# Patient Record
Sex: Female | Born: 1935 | Race: Asian | Hispanic: No | Smoking: Never smoker
Health system: Southern US, Community
[De-identification: ages and names within clinical notes are randomized; demographics above are authoritative.]

## PROBLEM LIST (undated history)

## (undated) DIAGNOSIS — J849 Interstitial pulmonary disease, unspecified: Secondary | ICD-10-CM

## (undated) HISTORY — PX: INNER EAR SURGERY: SHX679

## (undated) HISTORY — PX: JOINT REPLACEMENT: SHX530

## (undated) HISTORY — PX: NASAL SINUS SURGERY: SHX719

---

## 2001-01-15 ENCOUNTER — Encounter: Admission: RE | Admit: 2001-01-15 | Discharge: 2001-04-15 | Payer: Self-pay | Admitting: Internal Medicine

## 2001-01-29 ENCOUNTER — Other Ambulatory Visit: Admission: RE | Admit: 2001-01-29 | Discharge: 2001-01-29 | Payer: Self-pay | Admitting: Internal Medicine

## 2001-02-04 ENCOUNTER — Encounter: Payer: Self-pay | Admitting: Internal Medicine

## 2001-02-04 ENCOUNTER — Encounter: Admission: RE | Admit: 2001-02-04 | Discharge: 2001-02-04 | Payer: Self-pay | Admitting: Internal Medicine

## 2003-01-02 ENCOUNTER — Encounter: Payer: Self-pay | Admitting: Internal Medicine

## 2003-01-02 ENCOUNTER — Encounter: Admission: RE | Admit: 2003-01-02 | Discharge: 2003-01-02 | Payer: Self-pay | Admitting: Internal Medicine

## 2013-06-15 ENCOUNTER — Other Ambulatory Visit: Payer: Self-pay | Admitting: Internal Medicine

## 2013-06-15 ENCOUNTER — Other Ambulatory Visit: Payer: Self-pay | Admitting: Family Medicine

## 2013-06-15 DIAGNOSIS — Z1231 Encounter for screening mammogram for malignant neoplasm of breast: Secondary | ICD-10-CM

## 2013-06-15 DIAGNOSIS — M810 Age-related osteoporosis without current pathological fracture: Secondary | ICD-10-CM

## 2013-07-12 ENCOUNTER — Ambulatory Visit
Admission: RE | Admit: 2013-07-12 | Discharge: 2013-07-12 | Disposition: A | Discharge status: Home / Self Care | Payer: Medicare Other | Source: Ambulatory Visit | Attending: Family Medicine | Admitting: Family Medicine

## 2013-07-12 DIAGNOSIS — M810 Age-related osteoporosis without current pathological fracture: Secondary | ICD-10-CM

## 2013-07-12 DIAGNOSIS — Z1231 Encounter for screening mammogram for malignant neoplasm of breast: Secondary | ICD-10-CM

## 2014-05-17 ENCOUNTER — Ambulatory Visit (HOSPITAL_COMMUNITY): Payer: Medicare Other

## 2014-05-17 ENCOUNTER — Other Ambulatory Visit (HOSPITAL_COMMUNITY): Payer: Self-pay | Admitting: *Deleted

## 2014-05-17 DIAGNOSIS — M1711 Unilateral primary osteoarthritis, right knee: Secondary | ICD-10-CM

## 2014-05-18 ENCOUNTER — Ambulatory Visit (HOSPITAL_COMMUNITY)
Admission: RE | Admit: 2014-05-18 | Discharge: 2014-05-18 | Disposition: A | Discharge status: Home / Self Care | Payer: Medicare Other | Source: Ambulatory Visit | Attending: Cardiovascular Disease | Admitting: Cardiovascular Disease

## 2014-05-18 DIAGNOSIS — M79604 Pain in right leg: Secondary | ICD-10-CM

## 2014-05-18 DIAGNOSIS — M1710 Unilateral primary osteoarthritis, unspecified knee: Secondary | ICD-10-CM | POA: Insufficient documentation

## 2014-05-18 DIAGNOSIS — M79609 Pain in unspecified limb: Secondary | ICD-10-CM

## 2014-05-18 DIAGNOSIS — M1711 Unilateral primary osteoarthritis, right knee: Secondary | ICD-10-CM

## 2014-05-18 NOTE — Progress Notes (Signed)
Right Lower Extremity Venous Duplex Completed. No evidence for DVT or SVT. °[name],RVT °

## 2014-05-25 ENCOUNTER — Encounter: Payer: Self-pay | Admitting: *Deleted

## 2014-06-28 ENCOUNTER — Other Ambulatory Visit: Payer: Self-pay | Admitting: Family Medicine

## 2014-06-28 DIAGNOSIS — Z1231 Encounter for screening mammogram for malignant neoplasm of breast: Secondary | ICD-10-CM

## 2014-07-17 ENCOUNTER — Ambulatory Visit
Admission: RE | Admit: 2014-07-17 | Discharge: 2014-07-17 | Disposition: A | Discharge status: Home / Self Care | Payer: Medicare Other | Source: Ambulatory Visit | Attending: Family Medicine | Admitting: Family Medicine

## 2014-07-17 DIAGNOSIS — Z1231 Encounter for screening mammogram for malignant neoplasm of breast: Secondary | ICD-10-CM

## 2021-03-26 ENCOUNTER — Telehealth: Payer: Self-pay | Admitting: Internal Medicine

## 2021-03-26 NOTE — Telephone Encounter (Signed)
Left message for patients son to call back.

## 2021-03-27 NOTE — Telephone Encounter (Signed)
LMTCB

## 2021-03-27 NOTE — Telephone Encounter (Signed)
Pt's son is returning a phone call. Pt's son can be reached at [phone].

## 2021-03-28 NOTE — Telephone Encounter (Signed)
Called and spoke to pt's son, Catherine Terrell. He states he brought his mother, the pt, to the [place] from [place] because of pt's diagnosis of IPF. He states the pt has been diagnosed for a few years but has been deteriorating. Pt is on Esbriet (max dose) and uses supplemental O2. The soonest available for Dr. Isaiah Serge is 7/27 (pt already scheduled) and Dr. Marchelle Gearing for 8/9. Catherine Terrell is very eager to get pt in to be seen and requesting message be sent to see if pt can be worked in any sooner.    Dr. Mannam/Dr. Marchelle Gearing, please advise. Thank you.

## 2021-03-28 NOTE — Telephone Encounter (Signed)
ELise  Week of April 09, 2021 -8:30 AM on my research slots.  If there is no research patient on that particular time he can discuss with Lauren and with the patient in.  Also April 10, 2021 Wednesday I have afternoon clinic but I could see the patient at noon as a special add-on or 11:30 AM

## 2021-03-29 NOTE — Telephone Encounter (Signed)
thanks

## 2021-03-29 NOTE — Telephone Encounter (Signed)
Spoke with patient's son. He wishes to take the 830am slot on 03/10/21. I will cancel the appt with Dr. Isaiah Serge. I will place an ILD packet in the mail, address verified.   Will route back to MR as a FYI.

## 2021-04-02 ENCOUNTER — Ambulatory Visit: Payer: Self-pay | Admitting: Internal Medicine

## 2021-04-09 ENCOUNTER — Institutional Professional Consult (permissible substitution): Payer: Medicare Other | Admitting: Internal Medicine

## 2021-05-01 ENCOUNTER — Institutional Professional Consult (permissible substitution): Payer: Medicare Other | Admitting: Pulmonary Disease

## 2021-05-31 ENCOUNTER — Encounter (HOSPITAL_COMMUNITY): Payer: Self-pay | Admitting: Emergency Medicine

## 2021-05-31 ENCOUNTER — Emergency Department (HOSPITAL_COMMUNITY): Payer: Medicaid - Out of State

## 2021-05-31 ENCOUNTER — Inpatient Hospital Stay (HOSPITAL_COMMUNITY)
Admission: EM | Admit: 2021-05-31 | Discharge: 2021-07-09 | DRG: 480 | Disposition: A | Discharge status: Home / Self Care | Payer: Medicaid - Out of State | Attending: Internal Medicine | Admitting: Internal Medicine

## 2021-05-31 ENCOUNTER — Other Ambulatory Visit: Payer: Self-pay

## 2021-05-31 DIAGNOSIS — W1830XA Fall on same level, unspecified, initial encounter: Secondary | ICD-10-CM | POA: Diagnosis present

## 2021-05-31 DIAGNOSIS — J449 Chronic obstructive pulmonary disease, unspecified: Secondary | ICD-10-CM | POA: Diagnosis present

## 2021-05-31 DIAGNOSIS — Z96653 Presence of artificial knee joint, bilateral: Secondary | ICD-10-CM | POA: Diagnosis present

## 2021-05-31 DIAGNOSIS — E871 Hypo-osmolality and hyponatremia: Secondary | ICD-10-CM | POA: Diagnosis not present

## 2021-05-31 DIAGNOSIS — G9341 Metabolic encephalopathy: Secondary | ICD-10-CM | POA: Diagnosis not present

## 2021-05-31 DIAGNOSIS — Z23 Encounter for immunization: Secondary | ICD-10-CM

## 2021-05-31 DIAGNOSIS — R7989 Other specified abnormal findings of blood chemistry: Secondary | ICD-10-CM

## 2021-05-31 DIAGNOSIS — Z597 Insufficient social insurance and welfare support: Secondary | ICD-10-CM | POA: Diagnosis not present

## 2021-05-31 DIAGNOSIS — G3184 Mild cognitive impairment, so stated: Secondary | ICD-10-CM

## 2021-05-31 DIAGNOSIS — Y738 Miscellaneous gastroenterology and urology devices associated with adverse incidents, not elsewhere classified: Secondary | ICD-10-CM | POA: Diagnosis not present

## 2021-05-31 DIAGNOSIS — R339 Retention of urine, unspecified: Secondary | ICD-10-CM | POA: Diagnosis not present

## 2021-05-31 DIAGNOSIS — Z419 Encounter for procedure for purposes other than remedying health state, unspecified: Secondary | ICD-10-CM

## 2021-05-31 DIAGNOSIS — R030 Elevated blood-pressure reading, without diagnosis of hypertension: Secondary | ICD-10-CM | POA: Diagnosis not present

## 2021-05-31 DIAGNOSIS — Z79899 Other long term (current) drug therapy: Secondary | ICD-10-CM

## 2021-05-31 DIAGNOSIS — I959 Hypotension, unspecified: Secondary | ICD-10-CM | POA: Diagnosis not present

## 2021-05-31 DIAGNOSIS — F0390 Unspecified dementia without behavioral disturbance: Secondary | ICD-10-CM | POA: Diagnosis present

## 2021-05-31 DIAGNOSIS — J849 Interstitial pulmonary disease, unspecified: Secondary | ICD-10-CM | POA: Diagnosis not present

## 2021-05-31 DIAGNOSIS — Y92012 Bathroom of single-family (private) house as the place of occurrence of the external cause: Secondary | ICD-10-CM

## 2021-05-31 DIAGNOSIS — H9190 Unspecified hearing loss, unspecified ear: Secondary | ICD-10-CM | POA: Diagnosis present

## 2021-05-31 DIAGNOSIS — S72002A Fracture of unspecified part of neck of left femur, initial encounter for closed fracture: Secondary | ICD-10-CM | POA: Diagnosis present

## 2021-05-31 DIAGNOSIS — E785 Hyperlipidemia, unspecified: Secondary | ICD-10-CM | POA: Diagnosis present

## 2021-05-31 DIAGNOSIS — G4700 Insomnia, unspecified: Secondary | ICD-10-CM | POA: Diagnosis present

## 2021-05-31 DIAGNOSIS — Z7901 Long term (current) use of anticoagulants: Secondary | ICD-10-CM | POA: Diagnosis not present

## 2021-05-31 DIAGNOSIS — R0989 Other specified symptoms and signs involving the circulatory and respiratory systems: Secondary | ICD-10-CM

## 2021-05-31 DIAGNOSIS — E860 Dehydration: Secondary | ICD-10-CM | POA: Diagnosis not present

## 2021-05-31 DIAGNOSIS — Z20822 Contact with and (suspected) exposure to covid-19: Secondary | ICD-10-CM | POA: Diagnosis present

## 2021-05-31 DIAGNOSIS — N320 Bladder-neck obstruction: Secondary | ICD-10-CM | POA: Diagnosis not present

## 2021-05-31 DIAGNOSIS — D62 Acute posthemorrhagic anemia: Secondary | ICD-10-CM | POA: Diagnosis not present

## 2021-05-31 DIAGNOSIS — J84112 Idiopathic pulmonary fibrosis: Secondary | ICD-10-CM | POA: Diagnosis present

## 2021-05-31 DIAGNOSIS — E039 Hypothyroidism, unspecified: Secondary | ICD-10-CM | POA: Diagnosis present

## 2021-05-31 DIAGNOSIS — J9611 Chronic respiratory failure with hypoxia: Secondary | ICD-10-CM | POA: Diagnosis present

## 2021-05-31 DIAGNOSIS — Z8781 Personal history of (healed) traumatic fracture: Secondary | ICD-10-CM

## 2021-05-31 DIAGNOSIS — S72142A Displaced intertrochanteric fracture of left femur, initial encounter for closed fracture: Secondary | ICD-10-CM | POA: Diagnosis present

## 2021-05-31 DIAGNOSIS — T83021A Displacement of indwelling urethral catheter, initial encounter: Secondary | ICD-10-CM | POA: Diagnosis not present

## 2021-05-31 DIAGNOSIS — Z751 Person awaiting admission to adequate facility elsewhere: Secondary | ICD-10-CM

## 2021-05-31 DIAGNOSIS — Z79890 Hormone replacement therapy (postmenopausal): Secondary | ICD-10-CM

## 2021-05-31 HISTORY — DX: Interstitial pulmonary disease, unspecified: J84.9

## 2021-05-31 LAB — CBC WITH DIFFERENTIAL/PLATELET
Abs Immature Granulocytes: 0.08 10*3/uL — ABNORMAL HIGH (ref 0.00–0.07)
Basophils Absolute: 0 10*3/uL (ref 0.0–0.1)
Basophils Relative: 0 %
Eosinophils Absolute: 0.1 10*3/uL (ref 0.0–0.5)
Eosinophils Relative: 0 %
HCT: 30.9 % — ABNORMAL LOW (ref 36.0–46.0)
Hemoglobin: 10.4 g/dL — ABNORMAL LOW (ref 12.0–15.0)
Immature Granulocytes: 1 %
Lymphocytes Relative: 7 %
Lymphs Abs: 1 10*3/uL (ref 0.7–4.0)
MCH: 33.8 pg (ref 26.0–34.0)
MCHC: 33.7 g/dL (ref 30.0–36.0)
MCV: 100.3 fL — ABNORMAL HIGH (ref 80.0–100.0)
Monocytes Absolute: 0.9 10*3/uL (ref 0.1–1.0)
Monocytes Relative: 6 %
Neutro Abs: 11.4 10*3/uL — ABNORMAL HIGH (ref 1.7–7.7)
Neutrophils Relative %: 86 %
Platelets: 378 10*3/uL (ref 150–400)
RBC: 3.08 MIL/uL — ABNORMAL LOW (ref 3.87–5.11)
RDW: 14.4 % (ref 11.5–15.5)
WBC: 13.4 10*3/uL — ABNORMAL HIGH (ref 4.0–10.5)
nRBC: 0 % (ref 0.0–0.2)

## 2021-05-31 LAB — BASIC METABOLIC PANEL
Anion gap: 6 (ref 5–15)
BUN: 15 mg/dL (ref 8–23)
CO2: 25 mmol/L (ref 22–32)
Calcium: 8.5 mg/dL — ABNORMAL LOW (ref 8.9–10.3)
Chloride: 97 mmol/L — ABNORMAL LOW (ref 98–111)
Creatinine, Ser: 0.9 mg/dL (ref 0.44–1.00)
GFR, Estimated: >60 mL/min (ref >60)
Glucose, Bld: 117 mg/dL — ABNORMAL HIGH (ref 70–99)
Potassium: 5.9 mmol/L — ABNORMAL HIGH (ref 3.5–5.1)
Sodium: 128 mmol/L — ABNORMAL LOW (ref 135–145)

## 2021-05-31 LAB — RESP PANEL BY RT-PCR (FLU A&B, COVID) ARPGX2
Influenza A by PCR: NEGATIVE
Influenza B by PCR: NEGATIVE
SARS Coronavirus 2 by RT PCR: NEGATIVE

## 2021-05-31 MED ORDER — MEMANTINE HCL 10 MG PO TABS
5.0000 mg | ORAL_TABLET | Freq: Every day | ORAL | Status: DC
  Administered 2021-06-01 – 2021-07-09 (×36): 5 mg via ORAL
  Filled 2021-05-31 (×38): qty 1

## 2021-05-31 MED ORDER — LACTATED RINGERS IV SOLN: INTRAVENOUS | Status: DC

## 2021-05-31 MED ORDER — ONDANSETRON HCL 4 MG/2ML IJ SOLN: 4.0000 mg | Freq: Four times a day (QID) | INTRAMUSCULAR | Status: DC | PRN

## 2021-05-31 MED ORDER — LEVOTHYROXINE SODIUM 50 MCG PO TABS
50.0000 ug | ORAL_TABLET | ORAL | Status: DC
  Administered 2021-06-03 – 2021-07-09 (×21): 50 ug via ORAL
  Filled 2021-05-31 (×20): qty 1

## 2021-05-31 MED ORDER — PIRFENIDONE 267 MG PO TABS
801.0000 mg | ORAL_TABLET | Freq: Three times a day (TID) | ORAL | Status: DC
  Administered 2021-06-01 – 2021-07-09 (×88): 801 mg via ORAL
  Filled 2021-05-31 (×3): qty 3

## 2021-05-31 MED ORDER — SENNOSIDES-DOCUSATE SODIUM 8.6-50 MG PO TABS
1.0000 | ORAL_TABLET | Freq: Every evening | ORAL | Status: DC | PRN
  Administered 2021-06-07: 1 via ORAL
  Filled 2021-05-31: qty 1

## 2021-05-31 MED ORDER — SUVOREXANT 5 MG PO TABS: 5.0000 mg | ORAL_TABLET | Freq: Every evening | ORAL | Status: DC | PRN

## 2021-05-31 MED ORDER — ACETAMINOPHEN 650 MG RE SUPP: 650.0000 mg | Freq: Four times a day (QID) | RECTAL | Status: DC | PRN

## 2021-05-31 MED ORDER — ONDANSETRON HCL 4 MG PO TABS: 4.0000 mg | ORAL_TABLET | Freq: Four times a day (QID) | ORAL | Status: DC | PRN

## 2021-05-31 MED ORDER — DONEPEZIL HCL 10 MG PO TABS
5.0000 mg | ORAL_TABLET | Freq: Every day | ORAL | Status: DC
  Administered 2021-06-01 – 2021-07-08 (×38): 5 mg via ORAL
  Filled 2021-05-31 (×39): qty 1

## 2021-05-31 MED ORDER — LEVOTHYROXINE SODIUM 100 MCG PO TABS
100.0000 ug | ORAL_TABLET | ORAL | Status: DC
  Administered 2021-06-01 – 2021-07-07 (×16): 100 ug via ORAL
  Filled 2021-05-31 (×5): qty 1
  Filled 2021-05-31: qty 2
  Filled 2021-05-31 (×11): qty 1

## 2021-05-31 MED ORDER — ATORVASTATIN CALCIUM 10 MG PO TABS
10.0000 mg | ORAL_TABLET | Freq: Every day | ORAL | Status: DC
  Administered 2021-06-01 – 2021-07-09 (×36): 10 mg via ORAL
  Filled 2021-05-31 (×40): qty 1

## 2021-05-31 MED ORDER — ACETAMINOPHEN 325 MG PO TABS
650.0000 mg | ORAL_TABLET | Freq: Four times a day (QID) | ORAL | Status: DC | PRN
  Administered 2021-06-01: 650 mg via ORAL
  Filled 2021-05-31: qty 2

## 2021-05-31 MED ORDER — HYDROMORPHONE HCL 1 MG/ML IJ SOLN
0.5000 mg | INTRAMUSCULAR | Status: DC | PRN
  Administered 2021-06-01: 0.5 mg via INTRAVENOUS
  Filled 2021-05-31 (×2): qty 0.5

## 2021-05-31 MED ORDER — HYDRALAZINE HCL 20 MG/ML IJ SOLN: 5.0000 mg | Freq: Three times a day (TID) | INTRAMUSCULAR | Status: DC | PRN

## 2021-05-31 NOTE — ED Notes (Signed)
Called transport to take patient upstairs 

## 2021-05-31 NOTE — H&P (Signed)
History and Physical    Catherine Terrell:[id number] DOB: 1933-02-09 DOA: 05/31/2021  PCP: System, Provider Not In   Patient coming from: Home  Chief Complaint: Left hip pain after fall  HPI: Catherine Terrell with medical history significant for ILD, hypothyroidism, HLD, MCI who presents by EMS after fall at home.  Reportedly patient went to the bathroom and when she was trying to stand up she fell after losing her balance and landed on her left side.  She did not have any loss of consciousness according to the family.  Son is at the bedside and provides history as patient has cognitive impairment/dementia.  They think that she did hit her head was alert and complaining of left hip pain with a got to her shortly after the fall.  She is not on anticoagulation.  She is normally very independent and high functioning according to the son who is at bedside.  Patient is unable to provide any history on her own.  ED Course: Catherine Terrell has been mechanically stable in the emergency room with mildly elevated blood pressure with hip pain.  CT of the head and neck are negative for acute pathology.  Pelvis x-ray reveals comminuted impacted intertrochanteric left hip fracture with varus angulation.  Labs have been ordered in the emergency room and are pending.  COVID swab is pending.  ER physician discussed with orthopedic surgery who stated they will see patient in consultation in the morning and Dr. Charlann Boxer who is on-call Sunday will repair the hip fracture on Sunday.  Hospitalist service been asked to admit for further management  Review of Systems:  Accurate review of system cannot be obtained secondary to cognitive impairment  Past Medical History:  Diagnosis Date   Interstitial lung disease (HCC)    gets chronic shortness of breath from this    Past Surgical History:  Procedure Laterality Date   INNER EAR SURGERY     tube was inserted in 1 ear but unsure which ear - was done in [place]    JOINT REPLACEMENT Bilateral    bilateral knee replacements - was done in [place]   NASAL SINUS SURGERY     aprrox 1982 - was done in [place]    Social History  reports that she has never smoked. She has never used smokeless tobacco. She reports that she does not drink alcohol and does not use drugs.  Allergies  Allergen Reactions   Beef-Derived Products Other (See Comments)    VEGETARIAN- EATS NO MEAT!!   Chicken Protein Other (See Comments)    VEGETARIAN- EATS NO MEAT!!   Eggs Or Egg-Derived Products Other (See Comments)    VEGETARIAN- EATS NO EGGS!!!   Pork-Derived Products Other (See Comments)    VEGETARIAN- EATS NO MEAT!!    History reviewed. No pertinent family history.   Prior to Admission medications   Medication Sig Start Date End Date Taking? Authorizing Provider  atorvastatin (LIPITOR) 10 MG tablet Take 10 mg by mouth in the morning.   Yes [provider]  BELSOMRA 5 MG TABS Take 5 mg by mouth at bedtime as needed (for insomnia).   Yes [provider]  donepezil (ARICEPT) 5 MG tablet Take 5 mg by mouth at bedtime.   Yes [provider]  ibuprofen (ADVIL) 200 MG tablet Take 400 mg by mouth at bedtime as needed (for back pain- when not taking Tramadol 50 mg tablets).   Yes [provider]  levothyroxine (  SYNTHROID) 50 MCG tablet Take 50-100 mcg by mouth See admin instructions. Take 100   Yes [provider]  memantine (NAMENDA) 5 MG tablet Take 5 mg by mouth in the morning.   Yes [provider]  Pirfenidone 267 MG TABS Take 801 mg by mouth in the morning, at noon, and at bedtime.   Yes [provider]  Probiotic Product (ADVANCED PROBIOTIC PO) Take 1 tablet by mouth See admin instructions. Advanced Digestive Probiotic: Take 1 tablet by mouth in the morning   Yes [provider]  traMADol (ULTRAM) 50 MG tablet Take 50 mg by mouth at bedtime as needed.   Yes [provider]    Physical  Exam: Vitals:   05/31/21 1753 05/31/21 1924 05/31/21 2037  BP: (!) 180/71 (!) 124/100 (!) 163/85  Pulse: 64 81 96  Resp: 18 18 18   Temp: 98.5 F (36.9 C)    TempSrc: Oral    SpO2: 98% 99% 99%    Constitutional: NAD, calm Vitals:   05/31/21 1753 05/31/21 1924 05/31/21 2037  BP: (!) 180/71 (!) 124/100 (!) 163/85  Pulse: 64 81 96  Resp: 18 18 18   Temp: 98.5 F (36.9 C)    TempSrc: Oral    SpO2: 98% 99% 99%   General: Thin elderly Terrell. Alert and oriented to self. Son provides history.  Eyes: PERRL, conjunctivae normal.  Sclera nonicteric HENT:  [place]/AT, external ears normal.  Nares patent without epistasis.  Mucous membranes are moist.  Neck: Soft, normal range of motion, supple, no masses, Trachea midline Respiratory: clear to auscultation bilaterally, no wheezing, no crackles. Normal respiratory effort. No accessory muscle use.  Cardiovascular: Regular rate and rhythm, no murmurs / rubs / gallops. No extremity edema. 2+ pedal pulses. Abdomen: Soft, no tenderness, nondistended, no rebound or guarding.  No masses palpated. Bowel sounds normoactive Musculoskeletal: FROM of upper extremities and right lower extremity. No cyanosis. Normal muscle tone.  Left leg shortened and externally rotated.  Left hip tender to palpation anteriorly or laterally Skin: Warm, dry, intact no rashes, lesions, ulcers. No induration Neurologic: CN 2-12 grossly intact.  Normal speech.  Sensation intact to touch. Grip Strength 4/5 bilaterally   Psychiatric: Normal mood.    Labs on Admission: I have personally reviewed following labs and imaging studies  CBC: Recent Labs  Lab 05/31/21 1956  WBC 13.4*  NEUTROABS 11.4*  HGB 10.4*  HCT 30.9*  MCV 100.3*  PLT 378    Basic Metabolic Panel: Recent Labs  Lab 05/31/21 1956  NA 128*  K 5.9*  CL 97*  CO2 25  GLUCOSE 117*  BUN 15  CREATININE 0.90  CALCIUM 8.5*    GFR: CrCl cannot be calculated (Unknown ideal weight.).  Liver Function  Tests: No results for input(s): AST, ALT, ALKPHOS, BILITOT, PROT, ALBUMIN in the last 168 hours.  Urine analysis: No results found for: COLORURINE, APPEARANCEUR, LABSPEC, PHURINE, GLUCOSEU, HGBUR, BILIRUBINUR, KETONESUR, PROTEINUR, UROBILINOGEN, NITRITE, LEUKOCYTESUR  Radiological Exams on Admission: CT Head Wo Contrast  Result Date: 05/31/2021 CLINICAL DATA:  Un witnessed fall, left periorbital bruising EXAM: CT HEAD WITHOUT CONTRAST TECHNIQUE: Contiguous axial images were obtained from the base of the skull through the vertex without intravenous contrast. COMPARISON:  None. FINDINGS: Brain: No acute infarct or hemorrhage. Confluent hypodensities throughout the periventricular and subcortical white matter consistent with chronic small vessel ischemic change. There is diffuse cerebral atrophy with ex vacuo dilatation of the lateral ventricles. No acute extra-axial fluid collections. No mass effect. Vascular:  Diffuse atherosclerosis.  No hyperdense vessel. Skull: Normal. Negative for fracture or focal lesion. Sinuses/Orbits: No acute finding. Impacted cerumen within the left external auditory canal. Postsurgical changes are seen within the right middle ear and mastoid. Other: None. IMPRESSION: 1. No acute intracranial trauma. 2. Chronic small-vessel ischemic changes throughout the white matter. Electronically Signed   By: Sharlet SalinaMichael  Brown M.D.   On: 05/31/2021 18:25   CT Cervical Spine Wo Contrast  Result Date: 05/31/2021 CLINICAL DATA:  Un witnessed fall, left periorbital bruising EXAM: CT CERVICAL SPINE WITHOUT CONTRAST TECHNIQUE: Multidetector CT imaging of the cervical spine was performed without intravenous contrast. Multiplanar CT image reconstructions were also generated. COMPARISON:  None. FINDINGS: Alignment: Slight reversal cervical lordosis at the C4-5 level, likely due to spondylosis and facet hypertrophy. Otherwise alignment is anatomic. Skull base and vertebrae: Evaluation of the mid cervical  spine is limited due to patient motion. There are no acute displaced fractures. No destructive bony lesions. Soft tissues and spinal canal: No prevertebral fluid or swelling. No visible canal hematoma. Disc levels: There is severe spondylosis at C4-5. Prominent facet hypertrophic changes are seen on the right from C2 through C5, with bony fusion across the right C3-C4 facet. Upper chest: There is marked bronchiectasis within the visualized right upper lobe, with associated interstitial prominence and consolidation. Findings could reflect sequela of chronic fibrosis, though superimposed infection cannot be excluded. Other: Reconstructed images demonstrate no additional findings. IMPRESSION: 1. Limited evaluation of the mid cervical spine due to motion. 2. No acute displaced fractures. 3. Prominent spondylosis and facet hypertrophy as above. 4. Right upper lobe consolidation with interstitial prominence and bronchiectasis. Findings could reflect fibrosis and scarring, though superimposed infection cannot be excluded given unilateral findings. Electronically Signed   By: Sharlet SalinaMichael  Brown M.D.   On: 05/31/2021 18:28   DG Hip Unilat W or Wo Pelvis 2-3 Views Left  Result Date: 05/31/2021 CLINICAL DATA:  Larey SeatFell, left leg deformity EXAM: DG HIP (WITH OR WITHOUT PELVIS) 2-3V LEFT COMPARISON:  None. FINDINGS: Frontal view of the pelvis as well as frontal and cross-table lateral views of the left hip are obtained. There is a comminuted impacted intertrochanteric left hip fracture with varus angulation. No dislocation. No other acute displaced fractures. Marked enthesopathic changes versus osteochondroma off the left anterior iliac crest. Sacroiliac joints are normal. Mild lower lumbar degenerative changes. IMPRESSION: 1. Comminuted impacted intertrochanteric left hip fracture, with varus angulation. Electronically Signed   By: Sharlet SalinaMichael  Brown M.D.   On: 05/31/2021 18:34    EKG: Independently reviewed.  EKG has been ordered  and is pending  Assessment/Plan Principal Problem:   Closed left hip fracture  Ms. Sherryll BurgerShah is admitted to Med-Surg floor.  Orthopedics consulted and will see pt in am. Orthopedics will repair hip fracture on Sunday per ER physician report after discussed case with ortho.  Pain control provided.  Consult PT after surgery for evaluation and assess rehab needs.  Consult case management to arrange rehab placement Foley catheter.   Active Problems:   Interstitial lung disease  Chronic. Continue home meds    Hypothyroidism Continue levothyroxine    MCI (mild cognitive impairment) Pt is on Aricept and Namenda which are continued.     DVT prophylaxis: SCDs for DVT prophylaxis. No anticoagulation with impending hip surgery  Code Status:   Full Code  Family Communication:  Diagnosis and plan discussed with son who is at bedside.  He verbalized understanding agrees with plan.  Further recommendations to follow as clinical indicated  Disposition Plan:   Patient is from:  Home  Anticipated DC to:  Rehab after surgical repair of hip fracture  Anticipated DC date:  Anticipate 2 midnight or more stay in the hospital to treat acute condition  Anticipated DC barriers: Arranging rehabilitation bed after orthopedic hip surgery could be a barrier to discharge  Consults called:  Orthopedics, Dr. Charlann Boxer to take to surgery on Sunday  Admission status:  Inpatient   Claudean Severance Hilary Pundt MD [facility]  How to contact the [facility] Attending or Consulting provider 7A - 7P or covering provider during after hours 7P -7A, for this patient?   Check the care team in [facility] and look for a) attending/consulting [facility] provider listed and b) the [facility] team listed Log into [email or web address] and use [place]'s universal password to access. If you do not have the password, please contact the hospital operator. Locate the [facility] provider you are looking for under [facility] and page to a number that you can be directly  reached. If you still have difficulty reaching the provider, please page the [facility] (Director on Call) for the Hospitalists listed on amion for assistance.  05/31/2021, 9:57 PM

## 2021-05-31 NOTE — ED Notes (Signed)
Portable suction ordered to hook up purewick for patient.

## 2021-05-31 NOTE — ED Triage Notes (Signed)
BIBA Per EMS: Pt fell in bathroom. Rotated, shortening of L hip. Pt c/o pain in hip. Pt only speaks [place]. fentanyl given en route. 22 R forearm. A&Ox4. All vitals WDL.

## 2021-05-31 NOTE — ED Provider Notes (Addendum)
[facility]-EMERGENCY DEPT Provider Note   CSN: [id number] Arrival date & time: 05/31/21  1724     History No chief complaint on file.   Catherine Terrell is a 87 y.o. female.  HPI  87 year old female who is non-English speaking who is very hard of hearing presents the emergency department for left hip deformity.  Patient reportedly fell trying to use the bathroom.  She reported to family that she lost her balance, no report of syncope.  They believe that she did hit her head, she is not anticoagulated.  She is otherwise been in her usual state of health and is high functioning for her age.  History reviewed. No pertinent past medical history.  There are no problems to display for this patient.   History reviewed. No pertinent surgical history.   OB History   No obstetric history on file.     History reviewed. No pertinent family history.     Home Medications Prior to Admission medications   Not on File    Allergies    Patient has no allergy information on record.  Review of Systems   Review of Systems  Reason unable to perform ROS: Patient very hard of hearing.   Physical Exam Updated Vital Signs BP (!) 124/100   Pulse 81   Temp 98.5 F (36.9 C) (Oral)   Resp 18   SpO2 99%   Physical Exam Vitals and nursing note reviewed.  Constitutional:      General: She is not in acute distress.    Appearance: Normal appearance.  HENT:     Head: Normocephalic.     Mouth/Throat:     Mouth: Mucous membranes are moist.  Cardiovascular:     Rate and Rhythm: Normal rate.  Pulmonary:     Effort: Pulmonary effort is normal. No respiratory distress.  Abdominal:     Palpations: Abdomen is soft.     Tenderness: There is no abdominal tenderness.  Musculoskeletal:     Comments: Left lower extremity is rotated and shortened, hip swelling, tender to palpation, equal palpable DP pulses  Skin:    General: Skin is warm.  Neurological:     Mental Status:  She is alert. Mental status is at baseline.  Psychiatric:        Mood and Affect: Mood normal.    ED Results / Procedures / Treatments   Labs (all labs ordered are listed, but only abnormal results are displayed) Labs Reviewed  RESP PANEL BY RT-PCR (FLU A&B, COVID) [id number]  CBC WITH DIFFERENTIAL/PLATELET  BASIC METABOLIC PANEL    EKG None  Radiology CT Head Wo Contrast  Result Date: 05/31/2021 CLINICAL DATA:  Un witnessed fall, left periorbital bruising EXAM: CT HEAD WITHOUT CONTRAST TECHNIQUE: Contiguous axial images were obtained from the base of the skull through the vertex without intravenous contrast. COMPARISON:  None. FINDINGS: Brain: No acute infarct or hemorrhage. Confluent hypodensities throughout the periventricular and subcortical white matter consistent with chronic small vessel ischemic change. There is diffuse cerebral atrophy with ex vacuo dilatation of the lateral ventricles. No acute extra-axial fluid collections. No mass effect. Vascular: Diffuse atherosclerosis.  No hyperdense vessel. Skull: Normal. Negative for fracture or focal lesion. Sinuses/Orbits: No acute finding. Impacted cerumen within the left external auditory canal. Postsurgical changes are seen within the right middle ear and mastoid. Other: None. IMPRESSION: 1. No acute intracranial trauma. 2. Chronic small-vessel ischemic changes throughout the white matter. Electronically Signed   By: Maxwell Caul.D.  On: 05/31/2021 18:25   CT Cervical Spine Wo Contrast  Result Date: 05/31/2021 CLINICAL DATA:  Un witnessed fall, left periorbital bruising EXAM: CT CERVICAL SPINE WITHOUT CONTRAST TECHNIQUE: Multidetector CT imaging of the cervical spine was performed without intravenous contrast. Multiplanar CT image reconstructions were also generated. COMPARISON:  None. FINDINGS: Alignment: Slight reversal cervical lordosis at the C4-5 level, likely due to spondylosis and facet hypertrophy. Otherwise alignment is  anatomic. Skull base and vertebrae: Evaluation of the mid cervical spine is limited due to patient motion. There are no acute displaced fractures. No destructive bony lesions. Soft tissues and spinal canal: No prevertebral fluid or swelling. No visible canal hematoma. Disc levels: There is severe spondylosis at C4-5. Prominent facet hypertrophic changes are seen on the right from C2 through C5, with bony fusion across the right C3-C4 facet. Upper chest: There is marked bronchiectasis within the visualized right upper lobe, with associated interstitial prominence and consolidation. Findings could reflect sequela of chronic fibrosis, though superimposed infection cannot be excluded. Other: Reconstructed images demonstrate no additional findings. IMPRESSION: 1. Limited evaluation of the mid cervical spine due to motion. 2. No acute displaced fractures. 3. Prominent spondylosis and facet hypertrophy as above. 4. Right upper lobe consolidation with interstitial prominence and bronchiectasis. Findings could reflect fibrosis and scarring, though superimposed infection cannot be excluded given unilateral findings. Electronically Signed   By: Sharlet Salina M.D.   On: 05/31/2021 18:28   DG Hip Unilat W or Wo Pelvis 2-3 Views Left  Result Date: 05/31/2021 CLINICAL DATA:  Larey Seat, left leg deformity EXAM: DG HIP (WITH OR WITHOUT PELVIS) 2-3V LEFT COMPARISON:  None. FINDINGS: Frontal view of the pelvis as well as frontal and cross-table lateral views of the left hip are obtained. There is a comminuted impacted intertrochanteric left hip fracture with varus angulation. No dislocation. No other acute displaced fractures. Marked enthesopathic changes versus osteochondroma off the left anterior iliac crest. Sacroiliac joints are normal. Mild lower lumbar degenerative changes. IMPRESSION: 1. Comminuted impacted intertrochanteric left hip fracture, with varus angulation. Electronically Signed   By: Sharlet Salina M.D.   On:  05/31/2021 18:34    Procedures Procedures   Medications Ordered in ED Medications - No data to display  ED Course  I have reviewed the triage vital signs and the nursing notes.  Pertinent labs & imaging results that were available during my care of the patient were reviewed by me and considered in my medical decision making (see chart for details).    MDM Rules/Calculators/A&P                           87 year old female presents with mechanical fall.  She sustained a comminuted intertrochanteric left hip fracture with impaction and angulation.  Leg is vascularly intact.  Son has been updated and he relayed the information to the patient.  On-call orthopedic recommends admission to [facility], pain control, preoperative work up, NPO Saturday at midnight. Dr. Charlann Boxer will plan to surgically correct the hip Sunday. Patient will be medically admitted.  Patient stable at time of admission.  Son has returned with history/med list.  She is on medication for cholesterol, thyroid, dementia and interstitial lung disease.  This has been updated in the chart.  Son reports that the patient is otherwise independent and high functioning.  Final Clinical Impression(s) / ED Diagnoses Final diagnoses:  None    Rx / DC Orders ED Discharge Orders     None  Rozelle Logan, DO 05/31/21 2031    Rozelle Logan, DO 05/31/21 2056

## 2021-06-01 DIAGNOSIS — J849 Interstitial pulmonary disease, unspecified: Secondary | ICD-10-CM | POA: Diagnosis not present

## 2021-06-01 DIAGNOSIS — S72002A Fracture of unspecified part of neck of left femur, initial encounter for closed fracture: Secondary | ICD-10-CM | POA: Diagnosis not present

## 2021-06-01 LAB — BASIC METABOLIC PANEL
Anion gap: 8 (ref 5–15)
BUN: 14 mg/dL (ref 8–23)
CO2: 23 mmol/L (ref 22–32)
Calcium: 8.4 mg/dL — ABNORMAL LOW (ref 8.9–10.3)
Chloride: 99 mmol/L (ref 98–111)
Creatinine, Ser: 0.73 mg/dL (ref 0.44–1.00)
GFR, Estimated: >60 mL/min (ref >60)
Glucose, Bld: 117 mg/dL — ABNORMAL HIGH (ref 70–99)
Potassium: 4.1 mmol/L (ref 3.5–5.1)
Sodium: 130 mmol/L — ABNORMAL LOW (ref 135–145)

## 2021-06-01 LAB — CBC
HCT: 30.8 % — ABNORMAL LOW (ref 36.0–46.0)
Hemoglobin: 10.4 g/dL — ABNORMAL LOW (ref 12.0–15.0)
MCH: 33 pg (ref 26.0–34.0)
MCHC: 33.8 g/dL (ref 30.0–36.0)
MCV: 97.8 fL (ref 80.0–100.0)
Platelets: 355 10*3/uL (ref 150–400)
RBC: 3.15 MIL/uL — ABNORMAL LOW (ref 3.87–5.11)
RDW: 13.2 % (ref 11.5–15.5)
WBC: 9.9 10*3/uL (ref 4.0–10.5)
nRBC: 0 % (ref 0.0–0.2)

## 2021-06-01 LAB — OSMOLALITY: Osmolality: 272 mOsm/kg — ABNORMAL LOW (ref 275–295)

## 2021-06-01 LAB — SURGICAL PCR SCREEN
MRSA, PCR: NEGATIVE
Staphylococcus aureus: NEGATIVE

## 2021-06-01 MED ORDER — CHLORHEXIDINE GLUCONATE CLOTH 2 % EX PADS
6.0000 | MEDICATED_PAD | Freq: Every day | CUTANEOUS | Status: DC
  Administered 2021-06-01 – 2021-06-07 (×5): 6 via TOPICAL

## 2021-06-01 MED ORDER — CEFAZOLIN SODIUM-DEXTROSE 2-4 GM/100ML-% IV SOLN: 2.0000 g | INTRAVENOUS | Status: DC

## 2021-06-01 NOTE — Consult Note (Signed)
Reason for Consult:Left intertrochanteric hip fracture Referring Physician: Rhona Leavens MD  NATURE Catherine Terrell is an 87 y.o. female.  HPI: 87 yo female who is a household ambulator at home who fell and injured her left hip. Patient was unable to stand after the fall. She did hit her head by report from her son. He denied LOC. Patient has advanced dementia and is unable to provide history.   Past Medical History:  Diagnosis Date   Interstitial lung disease (HCC)    gets chronic shortness of breath from this    Past Surgical History:  Procedure Laterality Date   INNER EAR SURGERY     tube was inserted in 1 ear but unsure which ear - was done in [place]   JOINT REPLACEMENT Bilateral    bilateral knee replacements - was done in [place]   NASAL SINUS SURGERY     aprrox 1982 - was done in [place]    History reviewed. No pertinent family history.  Social History:  reports that she has never smoked. She has never used smokeless tobacco. She reports that she does not drink alcohol and does not use drugs.  Allergies:  Allergies  Allergen Reactions   Beef-Derived Products Other (See Comments)    VEGETARIAN- EATS NO MEAT!!   Chicken Protein Other (See Comments)    VEGETARIAN- EATS NO MEAT!!   Eggs Or Egg-Derived Products Other (See Comments)    VEGETARIAN- EATS NO EGGS!!!   Pork-Derived Products Other (See Comments)    VEGETARIAN- EATS NO MEAT!!    Medications: I have reviewed the patient's current medications.  Results for orders placed or performed during the hospital encounter of 05/31/21 (from the past 48 hour(s))  CBC with Differential     Status: Abnormal   Collection Time: 05/31/21  7:56 PM  Result Value Ref Range   WBC 13.4 (H) 4.0 - 10.5 K/uL   RBC 3.08 (L) 3.87 - 5.11 MIL/uL   Hemoglobin 10.4 (L) 12.0 - 15.0 g/dL   HCT 67.2 (L) 09.4 - 70.9 %   MCV 100.3 (H) 80.0 - 100.0 fL   MCH 33.8 26.0 - 34.0 pg   MCHC 33.7 30.0 - 36.0 g/dL   RDW 62.8 36.6 - 29.4 %   Platelets 378 150 - 400 K/uL    nRBC 0.0 0.0 - 0.2 %   Neutrophils Relative % 86 %   Neutro Abs 11.4 (H) 1.7 - 7.7 K/uL   Lymphocytes Relative 7 %   Lymphs Abs 1.0 0.7 - 4.0 K/uL   Monocytes Relative 6 %   Monocytes Absolute 0.9 0.1 - 1.0 K/uL   Eosinophils Relative 0 %   Eosinophils Absolute 0.1 0.0 - 0.5 K/uL   Basophils Relative 0 %   Basophils Absolute 0.0 0.0 - 0.1 K/uL   Immature Granulocytes 1 %   Abs Immature Granulocytes 0.08 (H) 0.00 - 0.07 K/uL    Comment: Performed at [facility], [address] [place], [place] [address]  Basic metabolic panel     Status: Abnormal   Collection Time: 05/31/21  7:56 PM  Result Value Ref Range   Sodium 128 (L) 135 - 145 mmol/L   Potassium 5.9 (H) 3.5 - 5.1 mmol/L   Chloride 97 (L) 98 - 111 mmol/L   CO2 25 22 - 32 mmol/L   Glucose, Bld 117 (H) 70 - 99 mg/dL    Comment: Glucose reference range applies only to samples taken after fasting for at least 8 hours.  BUN 15 8 - 23 mg/dL   Creatinine, Ser 3.29 0.44 - 1.00 mg/dL   Calcium 8.5 (L) 8.9 - 10.3 mg/dL   GFR, Estimated >51 >88 mL/min    Comment: (NOTE) Calculated using the CKD-EPI Creatinine Equation (2021)    Anion gap 6 5 - 15    Comment: Performed at [facility], [address], [place], [place] [address]  Resp Panel by RT-PCR (Flu A&B, Covid) Nasopharyngeal Swab     Status: None   Collection Time: 05/31/21  8:24 PM   Specimen: Nasopharyngeal Swab; Nasopharyngeal(NP) swabs in vial transport medium  Result Value Ref Range   SARS Coronavirus 2 by RT PCR NEGATIVE NEGATIVE    Comment: (NOTE) SARS-CoV-2 target nucleic acids are NOT DETECTED.  The SARS-CoV-2 RNA is generally detectable in upper respiratory specimens during the acute phase of infection. The lowest concentration of SARS-CoV-2 viral copies this assay can detect is 138 copies/mL. A negative result does not preclude SARS-Cov-2 infection and should not be used as the sole basis for treatment or other patient  management decisions. A negative result may occur with  improper specimen collection/handling, submission of specimen other than nasopharyngeal swab, presence of viral mutation(s) within the areas targeted by this assay, and inadequate number of viral copies(<138 copies/mL). A negative result must be combined with clinical observations, patient history, and epidemiological information. The expected result is Negative.  Fact Sheet for Patients:  BloggerCourse.com  Fact Sheet for Healthcare Providers:  SeriousBroker.it  This test is no t yet approved or cleared by the [place] FDA and  has been authorized for detection and/or diagnosis of SARS-CoV-2 by FDA under an Emergency Use Authorization (EUA). This EUA will remain  in effect (meaning this test can be used) for the duration of the COVID-19 declaration under Section 564(b)(1) of the Act, 21 U.S.C.section 360bbb-3(b)(1), unless the authorization is terminated  or revoked sooner.       Influenza A by PCR NEGATIVE NEGATIVE   Influenza B by PCR NEGATIVE NEGATIVE    Comment: (NOTE) The Xpert Xpress SARS-CoV-2/FLU/RSV plus assay is intended as an aid in the diagnosis of influenza from Nasopharyngeal swab specimens and should not be used as a sole basis for treatment. Nasal washings and aspirates are unacceptable for Xpert Xpress SARS-CoV-2/FLU/RSV testing.  Fact Sheet for Patients: BloggerCourse.com  Fact Sheet for Healthcare Providers: SeriousBroker.it  This test is not yet approved or cleared by the [place] FDA and has been authorized for detection and/or diagnosis of SARS-CoV-2 by FDA under an Emergency Use Authorization (EUA). This EUA will remain in effect (meaning this test can be used) for the duration of the COVID-19 declaration under Section 564(b)(1) of the Act, 21 U.S.C. section 360bbb-3(b)(1), unless the  authorization is terminated or revoked.  Performed at [facility], [address] [place], [place] [address]   Basic metabolic panel     Status: Abnormal   Collection Time: 06/01/21  3:35 AM  Result Value Ref Range   Sodium 130 (L) 135 - 145 mmol/L   Potassium 4.1 3.5 - 5.1 mmol/L    Comment: DELTA CHECK NOTED   Chloride 99 98 - 111 mmol/L   CO2 23 22 - 32 mmol/L   Glucose, Bld 117 (H) 70 - 99 mg/dL    Comment: Glucose reference range applies only to samples taken after fasting for at least 8 hours.   BUN 14 8 - 23 mg/dL   Creatinine, Ser 0.10 0.44 - 1.00  mg/dL   Calcium 8.4 (L) 8.9 - 10.3 mg/dL   GFR, Estimated >13 >24 mL/min    Comment: (NOTE) Calculated using the CKD-EPI Creatinine Equation (2021)    Anion gap 8 5 - 15    Comment: Performed at [facility], [address] [place], [place] [address]  CBC     Status: Abnormal   Collection Time: 06/01/21  3:35 AM  Result Value Ref Range   WBC 9.9 4.0 - 10.5 K/uL   RBC 3.15 (L) 3.87 - 5.11 MIL/uL   Hemoglobin 10.4 (L) 12.0 - 15.0 g/dL   HCT 72.5 (L) 36.6 - 44.0 %   MCV 97.8 80.0 - 100.0 fL   MCH 33.0 26.0 - 34.0 pg   MCHC 33.8 30.0 - 36.0 g/dL   RDW 34.7 42.5 - 95.6 %   Platelets 355 150 - 400 K/uL   nRBC 0.0 0.0 - 0.2 %    Comment: Performed at [facility], [address] [place], [place] [address]    CT Head Wo Contrast  Result Date: 05/31/2021 CLINICAL DATA:  Un witnessed fall, left periorbital bruising EXAM: CT HEAD WITHOUT CONTRAST TECHNIQUE: Contiguous axial images were obtained from the base of the skull through the vertex without intravenous contrast. COMPARISON:  None. FINDINGS: Brain: No acute infarct or hemorrhage. Confluent hypodensities throughout the periventricular and subcortical white matter consistent with chronic small vessel ischemic change. There is diffuse cerebral atrophy with ex vacuo dilatation of the lateral ventricles. No acute  extra-axial fluid collections. No mass effect. Vascular: Diffuse atherosclerosis.  No hyperdense vessel. Skull: Normal. Negative for fracture or focal lesion. Sinuses/Orbits: No acute finding. Impacted cerumen within the left external auditory canal. Postsurgical changes are seen within the right middle ear and mastoid. Other: None. IMPRESSION: 1. No acute intracranial trauma. 2. Chronic small-vessel ischemic changes throughout the white matter. Electronically Signed   By: Sharlet Salina M.D.   On: 05/31/2021 18:25   CT Cervical Spine Wo Contrast  Result Date: 05/31/2021 CLINICAL DATA:  Un witnessed fall, left periorbital bruising EXAM: CT CERVICAL SPINE WITHOUT CONTRAST TECHNIQUE: Multidetector CT imaging of the cervical spine was performed without intravenous contrast. Multiplanar CT image reconstructions were also generated. COMPARISON:  None. FINDINGS: Alignment: Slight reversal cervical lordosis at the C4-5 level, likely due to spondylosis and facet hypertrophy. Otherwise alignment is anatomic. Skull base and vertebrae: Evaluation of the mid cervical spine is limited due to patient motion. There are no acute displaced fractures. No destructive bony lesions. Soft tissues and spinal canal: No prevertebral fluid or swelling. No visible canal hematoma. Disc levels: There is severe spondylosis at C4-5. Prominent facet hypertrophic changes are seen on the right from C2 through C5, with bony fusion across the right C3-C4 facet. Upper chest: There is marked bronchiectasis within the visualized right upper lobe, with associated interstitial prominence and consolidation. Findings could reflect sequela of chronic fibrosis, though superimposed infection cannot be excluded. Other: Reconstructed images demonstrate no additional findings. IMPRESSION: 1. Limited evaluation of the mid cervical spine due to motion. 2. No acute displaced fractures. 3. Prominent spondylosis and facet hypertrophy as above. 4. Right upper lobe  consolidation with interstitial prominence and bronchiectasis. Findings could reflect fibrosis and scarring, though superimposed infection cannot be excluded given unilateral findings. Electronically Signed   By: Sharlet Salina M.D.   On: 05/31/2021 18:28   DG Hip Unilat W or Wo Pelvis 2-3 Views Left  Result Date: 05/31/2021 CLINICAL DATA:  Larey Seat, left leg  deformity EXAM: DG HIP (WITH OR WITHOUT PELVIS) 2-3V LEFT COMPARISON:  None. FINDINGS: Frontal view of the pelvis as well as frontal and cross-table lateral views of the left hip are obtained. There is a comminuted impacted intertrochanteric left hip fracture with varus angulation. No dislocation. No other acute displaced fractures. Marked enthesopathic changes versus osteochondroma off the left anterior iliac crest. Sacroiliac joints are normal. Mild lower lumbar degenerative changes. IMPRESSION: 1. Comminuted impacted intertrochanteric left hip fracture, with varus angulation. Electronically Signed   By: Sharlet SalinaMichael  Brown M.D.   On: 05/31/2021 18:34    Review of Systems Blood pressure 132/75, pulse 100, temperature 98 F (36.7 C), temperature source Oral, resp. rate 17, weight 46.8 kg, SpO2 95 %. Physical Exam Patient is an elderly female in moderate distress. Head and neck atraumatic with normal conjugate gaze. Neck is non tender. Normal cervical ROM Bilateral shoulder and arms with normal AROM Chest nontender Right LE with pain free AROM Left leg is shortened and externally rotated. Good perfusion distally  Assessment/Plan: Left displaced intertrochanteric hip fracture. Surgery planned for tomorrow. Plan will be IM nail per Dr Charlann Boxerlin.  Orders for consent placed. Will need family consent. Patient will need to be NPO after MN for surgery tomorrow.   Verlee RossettiSteven R Joahan Swatzell 06/01/2021, 8:22 AM

## 2021-06-01 NOTE — Progress Notes (Signed)
Orthopedic Tech Progress Note Patient Details:  Catherine Terrell 1933-02-26 [id number]  Patient ID: Catherine Terrell, female   DOB: 1933/06/26, 87 y.o.   MRN: [id number]  Catherine Terrell 06/01/2021, 10:13 AM Bucks traction applied to left leg. Patient tolerated 10 lbs.

## 2021-06-01 NOTE — Progress Notes (Signed)
PROGRESS NOTE    Catherine Terrell  OXB:[id number] DOB: 1933/03/22 DOA: 05/31/2021 PCP: System, Provider Not In    Brief Narrative:  87 y.o. female with medical history significant for ILD, hypothyroidism, HLD, MCI who presents by EMS after fall at home. Pt presented with L hip fracture. Orthopedic Surgery was consulted  Assessment & Plan:   Principal Problem:   Closed left hip fracture (HCC) Active Problems:   Interstitial lung disease (HCC)   Hypothyroidism   MCI (mild cognitive impairment)  Principal Problem:   Closed left hip fracture  -Orthopedics consulted with plan for surgery 8/28  -Cont with analgesia as tolerated -Dispo planning post-op with PT planned   Active Problems:   Interstitial lung disease  -Stable and on minmal O2 support at this time -Stable     Hypothyroidism -Continue levothyroxine as tolerated     MCI (mild cognitive impairment) -Pt is on Aricept and Namenda -Stable at this time   DVT prophylaxis: SCD's Code Status: Full Family Communication: Pt in room, family not at bedside  Status is: Inpatient  Remains inpatient appropriate because:Inpatient level of care appropriate due to severity of illness  Dispo: The patient is from: Home              Anticipated d/c is to: SNF              Patient currently is not medically stable to d/c.   Difficult to place patient No       Consultants:  Orthopedic Surgery  Procedures:    Antimicrobials: Anti-infectives (From admission, onward)    Start     Dose/Rate Route Frequency Ordered Stop   06/02/21 0600  ceFAZolin (ANCEF) IVPB 2g/100 mL premix        2 g 200 mL/hr over 30 Minutes Intravenous On call to O.R. 06/01/21 1333 06/03/21 0559       Subjective: Without complaints  Objective: Vitals:   06/01/21 0135 06/01/21 0532 06/01/21 0629 06/01/21 1350  BP: 133/70 132/75  114/88  Pulse: (!) 110 100  96  Resp: 16 (!) 24 17 17   Temp: 98 F (36.7 C) 98 F (36.7 C)    TempSrc: Oral Oral     SpO2: 98% 95%  94%  Weight:        Intake/Output Summary (Last 24 hours) at 06/01/2021 1350 Last data filed at 06/01/2021 1020 Gross per 24 hour  Intake 581.15 ml  Output 290 ml  Net 291.15 ml   Filed Weights   05/31/21 2309  Weight: 46.8 kg    Examination: General exam: Awake, laying in bed, in nad Respiratory system: Normal respiratory effort, no wheezing Cardiovascular system: regular rate, s1, s2 Gastrointestinal system: Soft, nondistended, positive BS Central nervous system: CN2-12 grossly intact, strength intact Extremities: Perfused, no clubbing Skin: Normal skin turgor, no notable skin lesions seen Psychiatry: Difficult to assess given mentation  Data Reviewed: I have personally reviewed following labs and imaging studies  CBC: Recent Labs  Lab 05/31/21 1956 06/01/21 0335  WBC 13.4* 9.9  NEUTROABS 11.4*  --   HGB 10.4* 10.4*  HCT 30.9* 30.8*  MCV 100.3* 97.8  PLT 378 355   Basic Metabolic Panel: Recent Labs  Lab 05/31/21 1956 06/01/21 0335  NA 128* 130*  K 5.9* 4.1  CL 97* 99  CO2 25 23  GLUCOSE 117* 117*  BUN 15 14  CREATININE 0.90 0.73  CALCIUM 8.5* 8.4*   GFR: CrCl cannot be calculated (Unknown ideal weight.). Liver Function Tests:  No results for input(s): AST, ALT, ALKPHOS, BILITOT, PROT, ALBUMIN in the last 168 hours. No results for input(s): LIPASE, AMYLASE in the last 168 hours. No results for input(s): AMMONIA in the last 168 hours. Coagulation Profile: No results for input(s): INR, PROTIME in the last 168 hours. Cardiac Enzymes: No results for input(s): CKTOTAL, CKMB, CKMBINDEX, TROPONINI in the last 168 hours. BNP (last 3 results) No results for input(s): PROBNP in the last 8760 hours. HbA1C: No results for input(s): HGBA1C in the last 72 hours. CBG: No results for input(s): GLUCAP in the last 168 hours. Lipid Profile: No results for input(s): CHOL, HDL, LDLCALC, TRIG, CHOLHDL, LDLDIRECT in the last 72 hours. Thyroid Function  Tests: No results for input(s): TSH, T4TOTAL, FREET4, T3FREE, THYROIDAB in the last 72 hours. Anemia Panel: No results for input(s): VITAMINB12, FOLATE, FERRITIN, TIBC, IRON, RETICCTPCT in the last 72 hours. Sepsis Labs: No results for input(s): PROCALCITON, LATICACIDVEN in the last 168 hours.  Recent Results (from the past 240 hour(s))  Resp Panel by RT-PCR (Flu A&B, Covid) Nasopharyngeal Swab     Status: None   Collection Time: 05/31/21  8:24 PM   Specimen: Nasopharyngeal Swab; Nasopharyngeal(NP) swabs in vial transport medium  Result Value Ref Range Status   SARS Coronavirus 2 by RT PCR NEGATIVE NEGATIVE Final    Comment: (NOTE) SARS-CoV-2 target nucleic acids are NOT DETECTED.  The SARS-CoV-2 RNA is generally detectable in upper respiratory specimens during the acute phase of infection. The lowest concentration of SARS-CoV-2 viral copies this assay can detect is 138 copies/mL. A negative result does not preclude SARS-Cov-2 infection and should not be used as the sole basis for treatment or other patient management decisions. A negative result may occur with  improper specimen collection/handling, submission of specimen other than nasopharyngeal swab, presence of viral mutation(s) within the areas targeted by this assay, and inadequate number of viral copies(<138 copies/mL). A negative result must be combined with clinical observations, patient history, and epidemiological information. The expected result is Negative.  Fact Sheet for Patients:  BloggerCourse.com  Fact Sheet for Healthcare Providers:  SeriousBroker.it  This test is no t yet approved or cleared by the [place] FDA and  has been authorized for detection and/or diagnosis of SARS-CoV-2 by FDA under an Emergency Use Authorization (EUA). This EUA will remain  in effect (meaning this test can be used) for the duration of the COVID-19 declaration under Section  564(b)(1) of the Act, 21 U.S.C.section 360bbb-3(b)(1), unless the authorization is terminated  or revoked sooner.       Influenza A by PCR NEGATIVE NEGATIVE Final   Influenza B by PCR NEGATIVE NEGATIVE Final    Comment: (NOTE) The Xpert Xpress SARS-CoV-2/FLU/RSV plus assay is intended as an aid in the diagnosis of influenza from Nasopharyngeal swab specimens and should not be used as a sole basis for treatment. Nasal washings and aspirates are unacceptable for Xpert Xpress SARS-CoV-2/FLU/RSV testing.  Fact Sheet for Patients: BloggerCourse.com  Fact Sheet for Healthcare Providers: SeriousBroker.it  This test is not yet approved or cleared by the [place] FDA and has been authorized for detection and/or diagnosis of SARS-CoV-2 by FDA under an Emergency Use Authorization (EUA). This EUA will remain in effect (meaning this test can be used) for the duration of the COVID-19 declaration under Section 564(b)(1) of the Act, 21 U.S.C. section 360bbb-3(b)(1), unless the authorization is terminated or revoked.  Performed at [facility] [address], [place], [place] [address]  Radiology Studies: CT Head Wo Contrast  Result Date: 05/31/2021 CLINICAL DATA:  Un witnessed fall, left periorbital bruising EXAM: CT HEAD WITHOUT CONTRAST TECHNIQUE: Contiguous axial images were obtained from the base of the skull through the vertex without intravenous contrast. COMPARISON:  None. FINDINGS: Brain: No acute infarct or hemorrhage. Confluent hypodensities throughout the periventricular and subcortical white matter consistent with chronic small vessel ischemic change. There is diffuse cerebral atrophy with ex vacuo dilatation of the lateral ventricles. No acute extra-axial fluid collections. No mass effect. Vascular: Diffuse atherosclerosis.  No hyperdense vessel. Skull: Normal. Negative for fracture or focal lesion.  Sinuses/Orbits: No acute finding. Impacted cerumen within the left external auditory canal. Postsurgical changes are seen within the right middle ear and mastoid. Other: None. IMPRESSION: 1. No acute intracranial trauma. 2. Chronic small-vessel ischemic changes throughout the white matter. Electronically Signed   By: Sharlet SalinaMichael  Brown M.D.   On: 05/31/2021 18:25   CT Cervical Spine Wo Contrast  Result Date: 05/31/2021 CLINICAL DATA:  Un witnessed fall, left periorbital bruising EXAM: CT CERVICAL SPINE WITHOUT CONTRAST TECHNIQUE: Multidetector CT imaging of the cervical spine was performed without intravenous contrast. Multiplanar CT image reconstructions were also generated. COMPARISON:  None. FINDINGS: Alignment: Slight reversal cervical lordosis at the C4-5 level, likely due to spondylosis and facet hypertrophy. Otherwise alignment is anatomic. Skull base and vertebrae: Evaluation of the mid cervical spine is limited due to patient motion. There are no acute displaced fractures. No destructive bony lesions. Soft tissues and spinal canal: No prevertebral fluid or swelling. No visible canal hematoma. Disc levels: There is severe spondylosis at C4-5. Prominent facet hypertrophic changes are seen on the right from C2 through C5, with bony fusion across the right C3-C4 facet. Upper chest: There is marked bronchiectasis within the visualized right upper lobe, with associated interstitial prominence and consolidation. Findings could reflect sequela of chronic fibrosis, though superimposed infection cannot be excluded. Other: Reconstructed images demonstrate no additional findings. IMPRESSION: 1. Limited evaluation of the mid cervical spine due to motion. 2. No acute displaced fractures. 3. Prominent spondylosis and facet hypertrophy as above. 4. Right upper lobe consolidation with interstitial prominence and bronchiectasis. Findings could reflect fibrosis and scarring, though superimposed infection cannot be excluded  given unilateral findings. Electronically Signed   By: Sharlet SalinaMichael  Brown M.D.   On: 05/31/2021 18:28   DG Hip Unilat W or Wo Pelvis 2-3 Views Left  Result Date: 05/31/2021 CLINICAL DATA:  Larey SeatFell, left leg deformity EXAM: DG HIP (WITH OR WITHOUT PELVIS) 2-3V LEFT COMPARISON:  None. FINDINGS: Frontal view of the pelvis as well as frontal and cross-table lateral views of the left hip are obtained. There is a comminuted impacted intertrochanteric left hip fracture with varus angulation. No dislocation. No other acute displaced fractures. Marked enthesopathic changes versus osteochondroma off the left anterior iliac crest. Sacroiliac joints are normal. Mild lower lumbar degenerative changes. IMPRESSION: 1. Comminuted impacted intertrochanteric left hip fracture, with varus angulation. Electronically Signed   By: Sharlet SalinaMichael  Brown M.D.   On: 05/31/2021 18:34    Scheduled Meds:  atorvastatin  10 mg Oral Daily   Chlorhexidine Gluconate Cloth  6 each Topical Daily   donepezil  5 mg Oral QHS   levothyroxine  100 mcg Oral Once per day on Sun Fri Sat   [START ON 06/03/2021] levothyroxine  50 mcg Oral Once per day on Mon Tue Wed Thu   memantine  5 mg Oral Daily   Pirfenidone  801 mg Oral TID  Continuous Infusions:  [START ON 06/02/2021]  ceFAZolin (ANCEF) IV     lactated ringers 60 mL/hr at 05/31/21 2248     LOS: 1 day   Rickey Barbara, MD [facility] Pager On Amion  If 7PM-7AM, please contact night-coverage 06/01/2021, 1:50 PM

## 2021-06-02 ENCOUNTER — Inpatient Hospital Stay (HOSPITAL_COMMUNITY): Payer: Medicaid - Out of State

## 2021-06-02 ENCOUNTER — Encounter (HOSPITAL_COMMUNITY)
Admission: EM | Disposition: A | Discharge status: Home / Self Care | Payer: Self-pay | Source: Home / Self Care | Attending: Internal Medicine

## 2021-06-02 ENCOUNTER — Inpatient Hospital Stay (HOSPITAL_COMMUNITY): Payer: Medicaid - Out of State | Admitting: Certified Registered"

## 2021-06-02 ENCOUNTER — Encounter (HOSPITAL_COMMUNITY): Payer: Self-pay | Admitting: Family Medicine

## 2021-06-02 DIAGNOSIS — S72002A Fracture of unspecified part of neck of left femur, initial encounter for closed fracture: Secondary | ICD-10-CM | POA: Diagnosis not present

## 2021-06-02 DIAGNOSIS — J849 Interstitial pulmonary disease, unspecified: Secondary | ICD-10-CM | POA: Diagnosis not present

## 2021-06-02 HISTORY — PX: INTRAMEDULLARY (IM) NAIL INTERTROCHANTERIC: SHX5875

## 2021-06-02 LAB — ABO/RH: ABO/RH(D): A POS

## 2021-06-02 SURGERY — FIXATION, FRACTURE, INTERTROCHANTERIC, WITH INTRAMEDULLARY ROD
Anesthesia: General | Site: Hip | Laterality: Left

## 2021-06-02 MED ORDER — OXYCODONE HCL 5 MG/5ML PO SOLN
5.0000 mg | Freq: Once | ORAL | Status: DC | PRN
Start: 2021-06-02 — End: 2021-06-02

## 2021-06-02 MED ORDER — PHENYLEPHRINE 40 MCG/ML (10ML) SYRINGE FOR IV PUSH (FOR BLOOD PRESSURE SUPPORT)
PREFILLED_SYRINGE | INTRAVENOUS | Status: AC
  Filled 2021-06-02: qty 10

## 2021-06-02 MED ORDER — DOCUSATE SODIUM 100 MG PO CAPS
100.0000 mg | ORAL_CAPSULE | Freq: Two times a day (BID) | ORAL | Status: DC
  Administered 2021-06-02 – 2021-06-10 (×14): 100 mg via ORAL
  Filled 2021-06-02 (×15): qty 1

## 2021-06-02 MED ORDER — MORPHINE SULFATE (PF) 2 MG/ML IV SOLN: 0.5000 mg | INTRAVENOUS | Status: DC | PRN

## 2021-06-02 MED ORDER — PROPOFOL 10 MG/ML IV BOLUS
INTRAVENOUS | Status: AC
  Filled 2021-06-02: qty 20

## 2021-06-02 MED ORDER — FENTANYL CITRATE PF 50 MCG/ML IJ SOSY: 25.0000 ug | PREFILLED_SYRINGE | INTRAMUSCULAR | Status: DC | PRN

## 2021-06-02 MED ORDER — ONDANSETRON HCL 4 MG/2ML IJ SOLN: 4.0000 mg | Freq: Four times a day (QID) | INTRAMUSCULAR | Status: DC | PRN

## 2021-06-02 MED ORDER — ONDANSETRON HCL 4 MG/2ML IJ SOLN
INTRAMUSCULAR | Status: AC
  Filled 2021-06-02: qty 2

## 2021-06-02 MED ORDER — PHENYLEPHRINE HCL (PRESSORS) 10 MG/ML IV SOLN
INTRAVENOUS | Status: DC | PRN
  Administered 2021-06-02: 80 ug via INTRAVENOUS
  Administered 2021-06-02: 120 ug via INTRAVENOUS
  Administered 2021-06-02: 40 ug via INTRAVENOUS

## 2021-06-02 MED ORDER — PHENOL 1.4 % MT LIQD: 1.0000 | OROMUCOSAL | Status: DC | PRN

## 2021-06-02 MED ORDER — POVIDONE-IODINE 10 % EX SWAB: 2.0000 "application " | Freq: Once | CUTANEOUS | Status: DC

## 2021-06-02 MED ORDER — SUGAMMADEX SODIUM 500 MG/5ML IV SOLN
INTRAVENOUS | Status: AC
  Filled 2021-06-02: qty 5

## 2021-06-02 MED ORDER — FENTANYL CITRATE (PF) 250 MCG/5ML IJ SOLN
INTRAMUSCULAR | Status: DC | PRN
  Administered 2021-06-02 (×2): 25 ug via INTRAVENOUS
  Administered 2021-06-02: 50 ug via INTRAVENOUS

## 2021-06-02 MED ORDER — CEFAZOLIN SODIUM-DEXTROSE 2-4 GM/100ML-% IV SOLN
2.0000 g | INTRAVENOUS | Status: AC
  Administered 2021-06-02: 2 g via INTRAVENOUS
  Filled 2021-06-02: qty 100

## 2021-06-02 MED ORDER — SUCCINYLCHOLINE CHLORIDE 200 MG/10ML IV SOSY
PREFILLED_SYRINGE | INTRAVENOUS | Status: AC
  Filled 2021-06-02: qty 10

## 2021-06-02 MED ORDER — ACETAMINOPHEN 325 MG PO TABS
325.0000 mg | ORAL_TABLET | Freq: Four times a day (QID) | ORAL | Status: DC | PRN
  Administered 2021-06-10 – 2021-06-11 (×2): 650 mg via ORAL
  Filled 2021-06-02 (×2): qty 2

## 2021-06-02 MED ORDER — METHOCARBAMOL 500 MG PO TABS
500.0000 mg | ORAL_TABLET | Freq: Four times a day (QID) | ORAL | Status: DC | PRN
  Administered 2021-06-02 – 2021-06-12 (×5): 500 mg via ORAL
  Filled 2021-06-02 (×5): qty 1

## 2021-06-02 MED ORDER — PROPOFOL 10 MG/ML IV BOLUS
INTRAVENOUS | Status: DC | PRN
  Administered 2021-06-02: 80 mg via INTRAVENOUS

## 2021-06-02 MED ORDER — DEXAMETHASONE SODIUM PHOSPHATE 10 MG/ML IJ SOLN
INTRAMUSCULAR | Status: AC
  Filled 2021-06-02: qty 1

## 2021-06-02 MED ORDER — HYDROCODONE-ACETAMINOPHEN 5-325 MG PO TABS
1.0000 | ORAL_TABLET | ORAL | Status: DC | PRN
  Administered 2021-06-02 – 2021-06-03 (×4): 1 via ORAL
  Administered 2021-06-04 – 2021-06-06 (×3): 2 via ORAL
  Administered 2021-06-06: 1 via ORAL
  Administered 2021-06-06: 2 via ORAL
  Administered 2021-06-07 – 2021-06-15 (×10): 1 via ORAL
  Filled 2021-06-02 (×2): qty 1
  Filled 2021-06-02: qty 2
  Filled 2021-06-02 (×2): qty 1
  Filled 2021-06-02 (×2): qty 2
  Filled 2021-06-02 (×5): qty 1
  Filled 2021-06-02: qty 2
  Filled 2021-06-02 (×6): qty 1
  Filled 2021-06-02: qty 2

## 2021-06-02 MED ORDER — ENSURE PRE-SURGERY PO LIQD
296.0000 mL | Freq: Once | ORAL | Status: AC
  Administered 2021-06-02: 296 mL via ORAL
  Filled 2021-06-02: qty 296

## 2021-06-02 MED ORDER — METOCLOPRAMIDE HCL 5 MG/ML IJ SOLN: 5.0000 mg | Freq: Three times a day (TID) | INTRAMUSCULAR | Status: DC | PRN

## 2021-06-02 MED ORDER — LIDOCAINE 2% (20 MG/ML) 5 ML SYRINGE
INTRAMUSCULAR | Status: AC
  Filled 2021-06-02: qty 5

## 2021-06-02 MED ORDER — DEXAMETHASONE SODIUM PHOSPHATE 10 MG/ML IJ SOLN
INTRAMUSCULAR | Status: DC | PRN
  Administered 2021-06-02: 5 mg via INTRAVENOUS

## 2021-06-02 MED ORDER — ALBUTEROL SULFATE HFA 108 (90 BASE) MCG/ACT IN AERS
INHALATION_SPRAY | RESPIRATORY_TRACT | Status: AC
  Filled 2021-06-02: qty 6.7

## 2021-06-02 MED ORDER — TRANEXAMIC ACID-NACL 1000-0.7 MG/100ML-% IV SOLN
INTRAVENOUS | Status: AC
  Administered 2021-06-02: 1000 mg via INTRAVENOUS
  Filled 2021-06-02: qty 100

## 2021-06-02 MED ORDER — METOCLOPRAMIDE HCL 5 MG PO TABS: 5.0000 mg | ORAL_TABLET | Freq: Three times a day (TID) | ORAL | Status: DC | PRN

## 2021-06-02 MED ORDER — CEFAZOLIN SODIUM-DEXTROSE 2-4 GM/100ML-% IV SOLN
2.0000 g | Freq: Four times a day (QID) | INTRAVENOUS | Status: AC
  Administered 2021-06-02 – 2021-06-03 (×2): 2 g via INTRAVENOUS
  Filled 2021-06-02 (×2): qty 100

## 2021-06-02 MED ORDER — HYDROCODONE-ACETAMINOPHEN 7.5-325 MG PO TABS
1.0000 | ORAL_TABLET | ORAL | Status: DC | PRN
  Administered 2021-06-05 (×2): 1 via ORAL
  Filled 2021-06-02 (×2): qty 1

## 2021-06-02 MED ORDER — PHENYLEPHRINE HCL (PRESSORS) 10 MG/ML IV SOLN
INTRAVENOUS | Status: AC
  Filled 2021-06-02: qty 1

## 2021-06-02 MED ORDER — FENTANYL CITRATE (PF) 250 MCG/5ML IJ SOLN
INTRAMUSCULAR | Status: AC
  Filled 2021-06-02: qty 5

## 2021-06-02 MED ORDER — BISACODYL 10 MG RE SUPP: 10.0000 mg | Freq: Every day | RECTAL | Status: DC | PRN

## 2021-06-02 MED ORDER — ROCURONIUM BROMIDE 10 MG/ML (PF) SYRINGE
PREFILLED_SYRINGE | INTRAVENOUS | Status: AC
  Filled 2021-06-02: qty 10

## 2021-06-02 MED ORDER — ASPIRIN EC 325 MG PO TBEC
325.0000 mg | DELAYED_RELEASE_TABLET | Freq: Every day | ORAL | Status: DC
  Administered 2021-06-03 – 2021-06-05 (×2): 325 mg via ORAL
  Filled 2021-06-02 (×3): qty 1

## 2021-06-02 MED ORDER — ONDANSETRON HCL 4 MG/2ML IJ SOLN: 4.0000 mg | Freq: Once | INTRAMUSCULAR | Status: DC | PRN

## 2021-06-02 MED ORDER — PHENYLEPHRINE HCL-NACL 20-0.9 MG/250ML-% IV SOLN
INTRAVENOUS | Status: DC | PRN
  Administered 2021-06-02: 25 ug/min via INTRAVENOUS

## 2021-06-02 MED ORDER — ONDANSETRON HCL 4 MG PO TABS: 4.0000 mg | ORAL_TABLET | Freq: Four times a day (QID) | ORAL | Status: DC | PRN

## 2021-06-02 MED ORDER — 0.9 % SODIUM CHLORIDE (POUR BTL) OPTIME
TOPICAL | Status: DC | PRN
  Administered 2021-06-02: 1000 mL

## 2021-06-02 MED ORDER — EPHEDRINE 5 MG/ML INJ
INTRAVENOUS | Status: AC
  Filled 2021-06-02: qty 5

## 2021-06-02 MED ORDER — CHLORHEXIDINE GLUCONATE 4 % EX LIQD
60.0000 mL | Freq: Once | CUTANEOUS | Status: DC
  Administered 2021-06-02: 4 via TOPICAL

## 2021-06-02 MED ORDER — METHOCARBAMOL 1000 MG/10ML IJ SOLN
500.0000 mg | Freq: Four times a day (QID) | INTRAMUSCULAR | Status: DC | PRN
  Filled 2021-06-02: qty 5

## 2021-06-02 MED ORDER — STERILE WATER FOR IRRIGATION IR SOLN
Status: DC | PRN
  Administered 2021-06-02: 2000 mL

## 2021-06-02 MED ORDER — TRANEXAMIC ACID-NACL 1000-0.7 MG/100ML-% IV SOLN
1000.0000 mg | INTRAVENOUS | Status: AC
  Administered 2021-06-02: 1000 mg via INTRAVENOUS

## 2021-06-02 MED ORDER — TRANEXAMIC ACID-NACL 1000-0.7 MG/100ML-% IV SOLN
1000.0000 mg | Freq: Once | INTRAVENOUS | Status: AC
  Filled 2021-06-02: qty 100

## 2021-06-02 MED ORDER — ONDANSETRON HCL 4 MG/2ML IJ SOLN
INTRAMUSCULAR | Status: DC | PRN
  Administered 2021-06-02: 4 mg via INTRAVENOUS

## 2021-06-02 MED ORDER — OXYCODONE HCL 5 MG PO TABS: 5.0000 mg | ORAL_TABLET | Freq: Once | ORAL | Status: DC | PRN

## 2021-06-02 MED ORDER — LIDOCAINE 2% (20 MG/ML) 5 ML SYRINGE
INTRAMUSCULAR | Status: DC | PRN
  Administered 2021-06-02: 40 mg via INTRAVENOUS

## 2021-06-02 MED ORDER — PROPOFOL 500 MG/50ML IV EMUL
INTRAVENOUS | Status: AC
  Filled 2021-06-02: qty 50

## 2021-06-02 MED ORDER — MENTHOL 3 MG MT LOZG: 1.0000 | LOZENGE | OROMUCOSAL | Status: DC | PRN

## 2021-06-02 MED ORDER — MIDAZOLAM HCL 2 MG/2ML IJ SOLN
INTRAMUSCULAR | Status: AC
  Filled 2021-06-02: qty 2

## 2021-06-02 MED ORDER — LIDOCAINE 2% (20 MG/ML) 5 ML SYRINGE
INTRAMUSCULAR | Status: AC
  Filled 2021-06-02: qty 15

## 2021-06-02 MED ORDER — POLYETHYLENE GLYCOL 3350 17 G PO PACK
17.0000 g | PACK | Freq: Every day | ORAL | Status: DC | PRN
Start: 2021-06-02 — End: 2021-06-07

## 2021-06-02 MED ORDER — LABETALOL HCL 5 MG/ML IV SOLN
5.0000 mg | INTRAVENOUS | Status: DC | PRN
  Administered 2021-06-02: 5 mg via INTRAVENOUS
  Filled 2021-06-02 (×2): qty 4

## 2021-06-02 SURGICAL SUPPLY — 35 items
BAG COUNTER SPONGE SURGICOUNT (BAG) IMPLANT
BAG ZIPLOCK 12X15 (MISCELLANEOUS) ×2 IMPLANT
BIT DRILL CANN LG 4.3MM (BIT) IMPLANT
COVER PERINEAL POST (MISCELLANEOUS) ×2 IMPLANT
COVER SURGICAL LIGHT HANDLE (MISCELLANEOUS) ×2 IMPLANT
DERMABOND ADVANCED (GAUZE/BANDAGES/DRESSINGS) ×1
DERMABOND ADVANCED .7 DNX12 (GAUZE/BANDAGES/DRESSINGS) IMPLANT
DRAPE STERI IOBAN 125X83 (DRAPES) ×2 IMPLANT
DRESSING AQUACEL AG SP 3.5X10 (GAUZE/BANDAGES/DRESSINGS) IMPLANT
DRESSING AQUACEL AG SP 3.5X6 (GAUZE/BANDAGES/DRESSINGS) IMPLANT
DRILL BIT CANN LG 4.3MM (BIT) ×2
DRSG AQUACEL AG ADV 3.5X 4 (GAUZE/BANDAGES/DRESSINGS) ×4 IMPLANT
DRSG AQUACEL AG ADV 3.5X 6 (GAUZE/BANDAGES/DRESSINGS) ×2 IMPLANT
DRSG AQUACEL AG SP 3.5X10 (GAUZE/BANDAGES/DRESSINGS) ×2
DRSG AQUACEL AG SP 3.5X6 (GAUZE/BANDAGES/DRESSINGS) ×2
DURAPREP 26ML APPLICATOR (WOUND CARE) ×2 IMPLANT
ELECT REM PT RETURN 15FT ADLT (MISCELLANEOUS) ×2 IMPLANT
GLOVE SURG UNDER POLY LF SZ7.5 (GLOVE) ×4 IMPLANT
GOWN STRL REUS W/TWL LRG LVL3 (GOWN DISPOSABLE) ×2 IMPLANT
GUIDEPIN VERSANAIL DSP 3.2X444 (ORTHOPEDIC DISPOSABLE SUPPLIES) ×1 IMPLANT
HIP FR NAIL LAG SCREW 10.5X110 (Orthopedic Implant) ×2 IMPLANT
KIT BASIN OR (CUSTOM PROCEDURE TRAY) ×2 IMPLANT
KIT TURNOVER KIT A (KITS) ×2 IMPLANT
MANIFOLD NEPTUNE II (INSTRUMENTS) ×2 IMPLANT
NAIL HIP FRACT 130D 9X180 (Orthopedic Implant) ×1 IMPLANT
PACK GENERAL/GYN (CUSTOM PROCEDURE TRAY) ×2 IMPLANT
PADDING CAST COTTON 6X4 STRL (CAST SUPPLIES) ×2 IMPLANT
PROTECTOR NERVE ULNAR (MISCELLANEOUS) ×2 IMPLANT
SCREW BONE CORTICAL 5.0X3 (Screw) ×1 IMPLANT
SCREW LAG HIP FR NAIL 10.5X110 (Orthopedic Implant) IMPLANT
SUT VIC AB 1 CT1 36 (SUTURE) ×2 IMPLANT
SUT VIC AB 2-0 CT1 27 (SUTURE) ×4
SUT VIC AB 2-0 CT1 27XBRD (SUTURE) ×2 IMPLANT
TOWEL OR 17X26 10 PK STRL BLUE (TOWEL DISPOSABLE) ×2 IMPLANT
TOWEL OR NON WOVEN STRL DISP B (DISPOSABLE) ×2 IMPLANT

## 2021-06-02 NOTE — Brief Op Note (Addendum)
05/31/2021 - 06/02/2021  9:08 AM  PATIENT:  Catherine Terrell  87 y.o. female  PRE-OPERATIVE DIAGNOSIS:  LEFT INTERTROCHANTERIC HIP FRACTURE  POST-OPERATIVE DIAGNOSIS:  Comminuted LEFT INTERTROCHANTERIC HIP FRACTURE  PROCEDURE:  Procedure(s): INTRAMEDULLARY (IM) NAIL INTERTROCHANTRIC (Left)  SURGEON:  Surgeon(s) and Role:    Durene Romans, MD - Primary  PHYSICIAN ASSISTANT: Rosalene Billings, PA-C  ANESTHESIA:   general  EBL:  <50 cc   BLOOD ADMINISTERED:none  DRAINS: none   LOCAL MEDICATIONS USED:  NONE  SPECIMEN:  No Specimen  DISPOSITION OF SPECIMEN:  N/A  COUNTS:  YES  TOURNIQUET:  * No tourniquets in log *  DICTATION: .Other Dictation: Dictation Number [id number]  PLAN OF CARE: Admit to inpatient   PATIENT DISPOSITION:  PACU - hemodynamically stable.   Delay start of Pharmacological VTE agent (>24hrs) due to surgical blood loss or risk of bleeding: no

## 2021-06-02 NOTE — Progress Notes (Signed)
   06/02/21 1729  Assess: MEWS Score  Temp (!) 97.5 F (36.4 C)  BP 111/73  Pulse Rate (!) 125 (RN notified)  Resp (!) 23 (RN notified)  SpO2 95 %  O2 Device Room Air  Assess: MEWS Score  MEWS Temp 0  MEWS Systolic 0  MEWS Pulse 2  MEWS RR 1  MEWS LOC 0  MEWS Score 3  MEWS Score Color Yellow  Assess: if the MEWS score is Yellow or Red  Were vital signs taken at a resting state? Yes  Focused Assessment No change from prior assessment  Does the patient meet 2 or more of the SIRS criteria? No  MEWS guidelines implemented *See Row Information* No, previously yellow, continue vital signs every 4 hours  Treat  MEWS Interventions Other (Comment) (MD notified and Labetalol given)  Pain Scale PAINAD  Pain Type Acute pain  Pain Location Hip  Pain Orientation Left  Breathing 1  Negative Vocalization 0  Facial Expression 0  Body Language 0  Consolability 0  PAINAD Score 1  Take Vital Signs  Increase Vital Sign Frequency  Yellow: Q 2hr X 2 then Q 4hr X 2, if remains yellow, continue Q 4hrs  Assess: SIRS CRITERIA  SIRS Temperature  0  SIRS Pulse 1  SIRS Respirations  1  SIRS WBC 0  SIRS Score Sum  2

## 2021-06-02 NOTE — Anesthesia Postprocedure Evaluation (Signed)
Anesthesia Post Note  Patient: Catherine Terrell  Procedure(s) Performed: INTRAMEDULLARY (IM) NAIL INTERTROCHANTRIC (Left: Hip)     Patient location during evaluation: PACU Anesthesia Type: General Level of consciousness: awake and alert Pain management: pain level controlled Vital Signs Assessment: post-procedure vital signs reviewed and stable Respiratory status: spontaneous breathing, nonlabored ventilation and respiratory function stable Cardiovascular status: blood pressure returned to baseline and stable Postop Assessment: no apparent nausea or vomiting Anesthetic complications: no   No notable events documented.  Last Vitals:  Vitals:   06/02/21 1410 06/02/21 1420  BP:    Pulse: (!) 107   Resp: 13 17  Temp:    SpO2: 100%     Last Pain:  Vitals:   06/02/21 1400  TempSrc:   PainSc: Asleep                 Lucretia Kern

## 2021-06-02 NOTE — Anesthesia Procedure Notes (Signed)
Date/Time: 06/02/2021 1:32 PM Performed by: Minerva Ends, CRNA Oxygen Delivery Method: Simple face mask Placement Confirmation: positive ETCO2 and breath sounds checked- equal and bilateral Dental Injury: Teeth and Oropharynx as per pre-operative assessment

## 2021-06-02 NOTE — H&P (View-Only) (Signed)
Patient ID: Catherine Terrell, female   DOB: 06/10/1933, 87 y.o.   MRN: [id number] Subjective: Day of Surgery Procedure(s) (LRB): INTRAMEDULLARY (IM) NAIL INTERTROCHANTRIC (Left)    Patient sleeping this am.  Objective:   VITALS:   Vitals:   06/01/21 2210 06/02/21 0554  BP: (!) 149/103 122/84  Pulse: (!) 110 (!) 101  Resp: 16 20  Temp: 98.5 F (36.9 C) (!) 97.5 F (36.4 C)  SpO2: 100% 91%    Exam: LLE shortened and ER Did not move LLE to cause unnecessary pain  LABS Recent Labs    05/31/21 1956 06/01/21 0335  HGB 10.4* 10.4*  HCT 30.9* 30.8*  WBC 13.4* 9.9  PLT 378 355    Recent Labs    05/31/21 1956 06/01/21 0335  NA 128* 130*  K 5.9* 4.1  BUN 15 14  CREATININE 0.90 0.73  GLUCOSE 117* 117*    No results for input(s): LABPT, INR in the last 72 hours.   Assessment/Plan: Day of Surgery Procedure(s) (LRB): INTRAMEDULLARY (IM) NAIL INTERTROCHANTRIC (Left)   Plan: NPO To OR today for ORIF of left hip IT fracture Will contact son to review  

## 2021-06-02 NOTE — Discharge Instructions (Signed)

## 2021-06-02 NOTE — Progress Notes (Signed)
Pt confused during night. Refused buck traction and SCDs. Pulled her IV out. Pt also did not void during night. Bladder scan done, 187 ml urine noted at 2:33 am. Will continue to monitor.

## 2021-06-02 NOTE — Op Note (Signed)
NAMEMARVA, HENDRYX MEDICAL RECORD NO: [id number] ACCOUNT NO: [id number] DATE OF BIRTH: May 13, 1933 FACILITY: [facility] LOCATION: [facility] PHYSICIAN: Madlyn Frankel. Charlann Boxer, MD  Operative Report   DATE OF PROCEDURE: 06/02/2021  PREOPERATIVE DIAGNOSIS:  Comminuted left intertrochanteric femur fracture.  POSTOPERATIVE DIAGNOSIS:  Comminuted left intertrochanteric femur fracture.  PROCEDURE:  Open reduction internal fixation with a trochanteric intramedullary nail from [employer] measuring 9 mm x 180 mm with a proximal lag screw that I actually locked as a fixed angle device due to the severe comminution of her proximal femur  as well as a distal interlock.  SURGEON:  Madlyn Frankel. Charlann Boxer, MD.  ASSISTANT:  Rosalene Billings, PA-C.  Note that Ms. Domenic Schwab was present for the entirety of the case for preoperative positioning, perioperative management of the operative extremity, general facilitation of the case and primary wound closure.  ANESTHESIA:  General.  SPECIMENS:  None.  COMPLICATIONS:  None.  ESTIMATED BLOOD LOSS:  Less than 50 mL  INDICATIONS FOR PROCEDURE:  The patient is an 87 year old female who is here from [place] visiting one of her sons.  She unfortunately had a ground level fall.  She had inability to bear weight with shortening and external rotation through the left  lower extremity.  She was brought to the emergency room where radiographs revealed a comminuted left intertrochanteric femur fracture.  She was admitted to the medical service per routine for management of hip fractures.  Orthopedics was consulted.  I  was consulted by my partners to address definitively.  The necessity of the procedure were discussed with her son.  Consent was obtained for management of the fracture.  We discussed the risk of malunion, nonunion, need for future surgeries as well as  standard risk of infection and DVT.  We discussed the postoperative course, and expectations with regards to plans for  travel both in the short and intermediate time frame.  Consent was obtained for management of the fracture.  DESCRIPTION OF PROCEDURE:  The patient was brought to the operative theater.  Once adequate anesthesia, preoperative antibiotics, Ancef administered she was positioned supine on the Hana table.  All bony prominences were carefully padded.  Her right leg  was flexed and abducted out of the way with bony prominences padded over the lateral peroneal nerve region.  The left foot was placed in the traction boot.  With a padded perineal post in place applied traction and internal rotation.  Under fluoroscopic  imaging, we identified severe comminution to this proximal hip.  On the lateral radiograph, there was significant posterior displacement of the shaft as compared to the neck and head.  This was all noted from a surgical planning and intraoperative  perspective.  The left hip was then prepped and draped in sterile fashion.  A time-out was performed identifying the patient, planned procedure, and extremity.  Once this was done, fluoroscopy was brought back to the field.  An incision was made lateral  and proximal to the trochanter.  A guidewire was then inserted into the tip of the trochanter, which was noted to be comminuted and posterior displaced.  I then passed this across the fracture site into the proximal aspect of the femur.  Once I had this  in position, I drilled the proximal femur and then placed the 9 x 180 mm nail across the fracture site.  We then located the approximate location laterally where the guidewire would be inserted.  With this landmark, we identified and made an incision  laterally.  I then was able to place a Cobb elevator over the anterior aspect of the lateral femur onto the neck and head region.  We then applied a downward pressure here for further assisting in reducing the fracture as close to anatomic position as  possible.  While [place] held the [employer] and  maintained the relationship of the neck shaft angle I passed a guidewire into the center of the head in the AP plane and slightly posterior on the lateral plane.  I then measured and selected a 105 mm  lag screw.  We drilled and then passed and screwed in the lag screw.  Once I had it in its appropriate position, took traction off.  I then compressed and medialized the shaft to this comminuted segment of the femur.  I then based on the comminution and  the displacement of her fracture, I elected to make this a fixed angle device as opposed to allow for further compression due to concerns for further shortening.  I then tightened the proximal locking bolt down to the lag screw.  I then placed a distal  interlock.  All wounds were irrigated.  The jig was removed and the final radiographs were obtained.  We were able to get the fracture into a near anatomic position, despite the severe comminution.  The proximal wound was closed in layers using #1 Vicryl  in the gluteal fascia.  The remainder of the wounds were closed with 2-0 Vicryl and a running Monocryl stitch.  The wounds were then cleaned, dried and dressed sterilely using surgical glue and Aquacel dressing.  She was then brought to the recovery  room in stable condition.  Postoperatively, I will try to restrict her to be partial weightbearing if possible.  She will be seen and evaluated by physical therapy and determination of needs and would likely transferred to a nursing facility based on  lack of support at home.   PUS D: 06/02/2021 1:25:13 pm T: 06/02/2021 2:10:00 pm  JOB: [id number]/ [id number]

## 2021-06-02 NOTE — Progress Notes (Signed)
Patient ID: Catherine Terrell, female   DOB: April 15, 1933, 87 y.o.   MRN: [id number] Subjective: Day of Surgery Procedure(s) (LRB): INTRAMEDULLARY (IM) NAIL INTERTROCHANTRIC (Left)    Patient sleeping this am.  Objective:   VITALS:   Vitals:   06/01/21 2210 06/02/21 0554  BP: (!) 149/103 122/84  Pulse: (!) 110 (!) 101  Resp: 16 20  Temp: 98.5 F (36.9 C) (!) 97.5 F (36.4 C)  SpO2: 100% 91%    Exam: LLE shortened and ER Did not move LLE to cause unnecessary pain  LABS Recent Labs    05/31/21 1956 06/01/21 0335  HGB 10.4* 10.4*  HCT 30.9* 30.8*  WBC 13.4* 9.9  PLT 378 355    Recent Labs    05/31/21 1956 06/01/21 0335  NA 128* 130*  K 5.9* 4.1  BUN 15 14  CREATININE 0.90 0.73  GLUCOSE 117* 117*    No results for input(s): LABPT, INR in the last 72 hours.   Assessment/Plan: Day of Surgery Procedure(s) (LRB): INTRAMEDULLARY (IM) NAIL INTERTROCHANTRIC (Left)   Plan: NPO To OR today for ORIF of left hip IT fracture Will contact son to review

## 2021-06-02 NOTE — Anesthesia Procedure Notes (Signed)
Procedure Name: LMA Insertion Date/Time: 06/02/2021 12:57 PM Performed by: Lucretia Kern, MD Pre-anesthesia Checklist: Patient identified, Emergency Drugs available, Suction available and Patient being monitored Patient Re-evaluated:Patient Re-evaluated prior to induction Oxygen Delivery Method: Circle system utilized Preoxygenation: Pre-oxygenation with 100% oxygen Induction Type: IV induction LMA: LMA with gastric port inserted LMA Size: 3.0 Number of attempts: 1 Placement Confirmation: positive ETCO2 Tube secured with: Tape Dental Injury: Teeth and Oropharynx as per pre-operative assessment

## 2021-06-02 NOTE — Plan of Care (Signed)
  Problem: Pain Management: Goal: Pain level will decrease Outcome: Progressing   Problem: Coping: Goal: Level of anxiety will decrease Outcome: Progressing   Problem: Pain Managment: Goal: General experience of comfort will improve Outcome: Progressing   Problem: Safety: Goal: Ability to remain free from injury will improve Outcome: Progressing

## 2021-06-02 NOTE — Interval H&P Note (Signed)
History and Physical Interval Note:  06/02/2021 11:24 AM  Catherine Terrell  has presented today for surgery, with the diagnosis of LEFT INTERTROCHANTERIC HIP FRACTURE.  The various methods of treatment have been discussed with the patient and family. After consideration of risks, benefits and other options for treatment, the patient has consented to  Procedure(s): INTRAMEDULLARY (IM) NAIL INTERTROCHANTRIC (Left) as a surgical intervention.  The patient's history has been reviewed, patient examined, no change in status, stable for surgery.  I have reviewed the patient's chart and labs.  Questions were answered to the patient's satisfaction.     Shelda Pal

## 2021-06-02 NOTE — Transfer of Care (Signed)
Immediate Anesthesia Transfer of Care Note  Patient: Catherine Terrell  Procedure(s) Performed: INTRAMEDULLARY (IM) NAIL INTERTROCHANTRIC (Left: Hip)  Patient Location: PACU  Anesthesia Type:GA LMA  Level of Consciousness: sedated  Airway & Oxygen Therapy: Patient Spontanous Breathing and Patient connected to face mask oxygen  Post-op Assessment: Report given to RN and Post -op Vital signs reviewed and stable  Post vital signs: Reviewed and stable  Last Vitals:  Vitals Value Taken Time  BP    Temp    Pulse    Resp    SpO2      Last Pain:  Vitals:   06/02/21 0554  TempSrc: Oral  PainSc:       Patients Stated Pain Goal: 3 (06/01/21 0535)  Complications: No notable events documented.

## 2021-06-02 NOTE — Anesthesia Preprocedure Evaluation (Addendum)
Anesthesia Evaluation  Patient identified by MRN, date of birth, ID band  Reviewed: Allergy & Precautions, NPO status , Patient's Chart, lab work & pertinent test results  History of Anesthesia Complications Negative for: history of anesthetic complications  Airway Mallampati: II  TM Distance: >3 FB Neck ROM: Full    Dental  (+) Edentulous Upper, Edentulous Lower   Pulmonary neg pulmonary ROS,  Interstitial lung disease   Pulmonary exam normal        Cardiovascular negative cardio ROS Normal cardiovascular exam     Neuro/Psych Dementia negative neurological ROS     GI/Hepatic negative GI ROS, Neg liver ROS,   Endo/Other  Hypothyroidism   Renal/GU negative Renal ROS  negative genitourinary   Musculoskeletal negative musculoskeletal ROS (+)   Abdominal   Peds  Hematology  (+) anemia ,   Anesthesia Other Findings   Reproductive/Obstetrics                           Anesthesia Physical Anesthesia Plan  ASA: 2  Anesthesia Plan: General   Post-op Pain Management:    Induction: Intravenous  PONV Risk Score and Plan: 3 and Ondansetron, Dexamethasone, Midazolam and Treatment may vary due to age or medical condition  Airway Management Planned: LMA  Additional Equipment: None  Intra-op Plan:   Post-operative Plan: Extubation in OR  Informed Consent: I have reviewed the patients History and Physical, chart, labs and discussed the procedure including the risks, benefits and alternatives for the proposed anesthesia with the patient or authorized representative who has indicated his/her understanding and acceptance.     Dental advisory given  Plan Discussed with:   Anesthesia Plan Comments:        Anesthesia Quick Evaluation

## 2021-06-02 NOTE — Progress Notes (Signed)
PROGRESS NOTE    Catherine Terrell  SEG:[id number] DOB: 1932/10/16 DOA: 05/31/2021 PCP: System, Provider Not In    Brief Narrative:  87 y.o. female with medical history significant for ILD, hypothyroidism, HLD, MCI who presents by EMS after fall at home. Pt presented with L hip fracture. Orthopedic Surgery was consulted  Assessment & Plan:   Principal Problem:   Closed left hip fracture (HCC) Active Problems:   Interstitial lung disease (HCC)   Hypothyroidism   MCI (mild cognitive impairment)  Principal Problem:   Closed left hip fracture  -Orthopedics consulted now s/p surgery 8/28 -Cont with analgesia as tolerated -will f/u with PT recs   Active Problems:   Interstitial lung disease  -Stable and on minmal O2 support at this time -Remains stable     Hypothyroidism -Continue levothyroxine as tolerated     MCI (mild cognitive impairment) -Pt is on Aricept and Namenda -Currently stable  DVT prophylaxis: SCD's Code Status: Full Family Communication: Pt in room, family not at bedside  Status is: Inpatient  Remains inpatient appropriate because:Inpatient level of care appropriate due to severity of illness  Dispo: The patient is from: Home              Anticipated d/c is to: SNF              Patient currently is not medically stable to d/c.   Difficult to place patient No    Consultants:  Orthopedic Surgery  Procedures:    Antimicrobials: Anti-infectives (From admission, onward)    Start     Dose/Rate Route Frequency Ordered Stop   06/02/21 1830  ceFAZolin (ANCEF) IVPB 2g/100 mL premix        2 g 200 mL/hr over 30 Minutes Intravenous Every 6 hours 06/02/21 1454 06/03/21 0629   06/02/21 0830  ceFAZolin (ANCEF) IVPB 2g/100 mL premix        2 g 200 mL/hr over 30 Minutes Intravenous On call to O.R. 06/02/21 0744 06/02/21 1228   06/02/21 0600  ceFAZolin (ANCEF) IVPB 2g/100 mL premix  Status:  Discontinued        2 g 200 mL/hr over 30 Minutes Intravenous On call  to O.R. 06/01/21 1333 06/02/21 0748       Subjective: Without complaints  Objective: Vitals:   06/02/21 1350 06/02/21 1400 06/02/21 1410 06/02/21 1420  BP:  127/76    Pulse: 99 (!) 105 (!) 107   Resp: 17 18 13 17   Temp:      TempSrc:      SpO2: 100% 100% 100%   Weight:      Height:        Intake/Output Summary (Last 24 hours) at 06/02/2021 1508 Last data filed at 06/02/2021 1343 Gross per 24 hour  Intake 1577.18 ml  Output 100 ml  Net 1477.18 ml    Filed Weights   05/31/21 2309 06/02/21 0233  Weight: 46.8 kg 46.8 kg    Examination: General exam: laying in bed, in no acute distress Respiratory system: normal chest rise, clear, no audible wheezing Cardiovascular system: regular rhythm, s1-s2 Gastrointestinal system: Nondistended, nontender, pos BS Central nervous system: No seizures, no tremors Extremities: No cyanosis, no joint deformities Skin: No rashes, no pallor Psychiatry: difficult to assess    Data Reviewed: I have personally reviewed following labs and imaging studies  CBC: Recent Labs  Lab 05/31/21 1956 06/01/21 0335  WBC 13.4* 9.9  NEUTROABS 11.4*  --   HGB 10.4* 10.4*  HCT 30.9*  30.8*  MCV 100.3* 97.8  PLT 378 355    Basic Metabolic Panel: Recent Labs  Lab 05/31/21 1956 06/01/21 0335  NA 128* 130*  K 5.9* 4.1  CL 97* 99  CO2 25 23  GLUCOSE 117* 117*  BUN 15 14  CREATININE 0.90 0.73  CALCIUM 8.5* 8.4*    GFR: Estimated Creatinine Clearance: 32.3 mL/min (by C-G formula based on SCr of 0.73 mg/dL). Liver Function Tests: No results for input(s): AST, ALT, ALKPHOS, BILITOT, PROT, ALBUMIN in the last 168 hours. No results for input(s): LIPASE, AMYLASE in the last 168 hours. No results for input(s): AMMONIA in the last 168 hours. Coagulation Profile: No results for input(s): INR, PROTIME in the last 168 hours. Cardiac Enzymes: No results for input(s): CKTOTAL, CKMB, CKMBINDEX, TROPONINI in the last 168 hours. BNP (last 3  results) No results for input(s): PROBNP in the last 8760 hours. HbA1C: No results for input(s): HGBA1C in the last 72 hours. CBG: No results for input(s): GLUCAP in the last 168 hours. Lipid Profile: No results for input(s): CHOL, HDL, LDLCALC, TRIG, CHOLHDL, LDLDIRECT in the last 72 hours. Thyroid Function Tests: No results for input(s): TSH, T4TOTAL, FREET4, T3FREE, THYROIDAB in the last 72 hours. Anemia Panel: No results for input(s): VITAMINB12, FOLATE, FERRITIN, TIBC, IRON, RETICCTPCT in the last 72 hours. Sepsis Labs: No results for input(s): PROCALCITON, LATICACIDVEN in the last 168 hours.  Recent Results (from the past 240 hour(s))  Resp Panel by RT-PCR (Flu A&B, Covid) Nasopharyngeal Swab     Status: None   Collection Time: 05/31/21  8:24 PM   Specimen: Nasopharyngeal Swab; Nasopharyngeal(NP) swabs in vial transport medium  Result Value Ref Range Status   SARS Coronavirus 2 by RT PCR NEGATIVE NEGATIVE Final    Comment: (NOTE) SARS-CoV-2 target nucleic acids are NOT DETECTED.  The SARS-CoV-2 RNA is generally detectable in upper respiratory specimens during the acute phase of infection. The lowest concentration of SARS-CoV-2 viral copies this assay can detect is 138 copies/mL. A negative result does not preclude SARS-Cov-2 infection and should not be used as the sole basis for treatment or other patient management decisions. A negative result may occur with  improper specimen collection/handling, submission of specimen other than nasopharyngeal swab, presence of viral mutation(s) within the areas targeted by this assay, and inadequate number of viral copies(<138 copies/mL). A negative result must be combined with clinical observations, patient history, and epidemiological information. The expected result is Negative.  Fact Sheet for Patients:  BloggerCourse.comhttps://www.fda.gov/media/152166/download  Fact Sheet for Healthcare Providers:   SeriousBroker.ithttps://www.fda.gov/media/152162/download  This test is no t yet approved or cleared by the [place] FDA and  has been authorized for detection and/or diagnosis of SARS-CoV-2 by FDA under an Emergency Use Authorization (EUA). This EUA will remain  in effect (meaning this test can be used) for the duration of the COVID-19 declaration under Section 564(b)(1) of the Act, 21 U.S.C.section 360bbb-3(b)(1), unless the authorization is terminated  or revoked sooner.       Influenza A by PCR NEGATIVE NEGATIVE Final   Influenza B by PCR NEGATIVE NEGATIVE Final    Comment: (NOTE) The Xpert Xpress SARS-CoV-2/FLU/RSV plus assay is intended as an aid in the diagnosis of influenza from Nasopharyngeal swab specimens and should not be used as a sole basis for treatment. Nasal washings and aspirates are unacceptable for Xpert Xpress SARS-CoV-2/FLU/RSV testing.  Fact Sheet for Patients: BloggerCourse.comhttps://www.fda.gov/media/152166/download  Fact Sheet for Healthcare Providers: SeriousBroker.ithttps://www.fda.gov/media/152162/download  This test is not yet approved or  cleared by the [place] and has been authorized for detection and/or diagnosis of SARS-CoV-2 by FDA under an Emergency Use Authorization (EUA). This EUA will remain in effect (meaning this test can be used) for the duration of the COVID-19 declaration under Section 564(b)(1) of the Act, 21 U.S.C. section 360bbb-3(b)(1), unless the authorization is terminated or revoked.  Performed at [facility], [address], [place], [place] [address]   Surgical PCR screen     Status: None   Collection Time: 06/01/21  6:31 PM   Specimen: Nasal Mucosa; Nasal Swab  Result Value Ref Range Status   MRSA, PCR NEGATIVE NEGATIVE Final   Staphylococcus aureus NEGATIVE NEGATIVE Final    Comment: (NOTE) The Xpert SA Assay (FDA approved for NASAL specimens in patients 36 years of age and older), is one component of a  comprehensive surveillance program. It is not intended to diagnose infection nor to guide or monitor treatment. Performed at [facility], [address] [place], [place] [address]       Radiology Studies: CT Head Wo Contrast  Result Date: 05/31/2021 CLINICAL DATA:  Un witnessed fall, left periorbital bruising EXAM: CT HEAD WITHOUT CONTRAST TECHNIQUE: Contiguous axial images were obtained from the base of the skull through the vertex without intravenous contrast. COMPARISON:  None. FINDINGS: Brain: No acute infarct or hemorrhage. Confluent hypodensities throughout the periventricular and subcortical white matter consistent with chronic small vessel ischemic change. There is diffuse cerebral atrophy with ex vacuo dilatation of the lateral ventricles. No acute extra-axial fluid collections. No mass effect. Vascular: Diffuse atherosclerosis.  No hyperdense vessel. Skull: Normal. Negative for fracture or focal lesion. Sinuses/Orbits: No acute finding. Impacted cerumen within the left external auditory canal. Postsurgical changes are seen within the right middle ear and mastoid. Other: None. IMPRESSION: 1. No acute intracranial trauma. 2. Chronic small-vessel ischemic changes throughout the white matter. Electronically Signed   By: Sharlet Salina M.D.   On: 05/31/2021 18:25   CT Cervical Spine Wo Contrast  Result Date: 05/31/2021 CLINICAL DATA:  Un witnessed fall, left periorbital bruising EXAM: CT CERVICAL SPINE WITHOUT CONTRAST TECHNIQUE: Multidetector CT imaging of the cervical spine was performed without intravenous contrast. Multiplanar CT image reconstructions were also generated. COMPARISON:  None. FINDINGS: Alignment: Slight reversal cervical lordosis at the C4-5 level, likely due to spondylosis and facet hypertrophy. Otherwise alignment is anatomic. Skull base and vertebrae: Evaluation of the mid cervical spine is limited due to patient motion. There are no acute displaced  fractures. No destructive bony lesions. Soft tissues and spinal canal: No prevertebral fluid or swelling. No visible canal hematoma. Disc levels: There is severe spondylosis at C4-5. Prominent facet hypertrophic changes are seen on the right from C2 through C5, with bony fusion across the right C3-C4 facet. Upper chest: There is marked bronchiectasis within the visualized right upper lobe, with associated interstitial prominence and consolidation. Findings could reflect sequela of chronic fibrosis, though superimposed infection cannot be excluded. Other: Reconstructed images demonstrate no additional findings. IMPRESSION: 1. Limited evaluation of the mid cervical spine due to motion. 2. No acute displaced fractures. 3. Prominent spondylosis and facet hypertrophy as above. 4. Right upper lobe consolidation with interstitial prominence and bronchiectasis. Findings could reflect fibrosis and scarring, though superimposed infection cannot be excluded given unilateral findings. Electronically Signed   By: Sharlet Salina M.D.   On: 05/31/2021 18:28   DG C-Arm 1-60 Min-No Report  Result Date: 06/02/2021 Fluoroscopy was utilized by the requesting  physician.  No radiographic interpretation.   DG HIP OPERATIVE UNILAT W OR W/O PELVIS LEFT  Result Date: 06/02/2021 CLINICAL DATA:  Intramedullary rod fixation of left hip fracture. EXAM: OPERATIVE left HIP (WITH PELVIS IF PERFORMED) 4 VIEWS TECHNIQUE: Fluoroscopic spot image(s) were submitted for interpretation post-operatively. Radiation exposure index: 7.9638 mGy. COMPARISON:  May 31, 2021. FINDINGS: Four intraoperative fluoroscopic images were obtained of the left hip. These images demonstrate surgical internal fixation of intertrochanteric fracture of the proximal left femur. IMPRESSION: Fluoroscopic guidance provided during surgical internal fixation of proximal left femoral intertrochanteric fracture. Electronically Signed   By: Lupita Raider M.D.   On:  06/02/2021 13:53   DG Hip Unilat W or Wo Pelvis 2-3 Views Left  Result Date: 05/31/2021 CLINICAL DATA:  Larey Seat, left leg deformity EXAM: DG HIP (WITH OR WITHOUT PELVIS) 2-3V LEFT COMPARISON:  None. FINDINGS: Frontal view of the pelvis as well as frontal and cross-table lateral views of the left hip are obtained. There is a comminuted impacted intertrochanteric left hip fracture with varus angulation. No dislocation. No other acute displaced fractures. Marked enthesopathic changes versus osteochondroma off the left anterior iliac crest. Sacroiliac joints are normal. Mild lower lumbar degenerative changes. IMPRESSION: 1. Comminuted impacted intertrochanteric left hip fracture, with varus angulation. Electronically Signed   By: Sharlet Salina M.D.   On: 05/31/2021 18:34    Scheduled Meds:  [START ON 06/03/2021] aspirin EC  325 mg Oral Daily   atorvastatin  10 mg Oral Daily   Chlorhexidine Gluconate Cloth  6 each Topical Daily   docusate sodium  100 mg Oral BID   donepezil  5 mg Oral QHS   levothyroxine  100 mcg Oral Once per day on Sun Fri Sat   [START ON 06/03/2021] levothyroxine  50 mcg Oral Once per day on Mon Tue Wed Thu   memantine  5 mg Oral Daily   Pirfenidone  801 mg Oral TID   Continuous Infusions:   ceFAZolin (ANCEF) IV     lactated ringers 0 mL (06/02/21 0300)   methocarbamol (ROBAXIN) IV     tranexamic acid       LOS: 2 days   Rickey Barbara, MD [facility] Pager On Amion  If 7PM-7AM, please contact night-coverage 06/02/2021, 3:08 PM

## 2021-06-02 NOTE — Plan of Care (Signed)
  Problem: Clinical Measurements: Goal: Postoperative complications will be avoided or minimized Outcome: Progressing   Problem: Pain Management: Goal: Pain level will decrease Outcome: Progressing   Problem: Coping: Goal: Level of anxiety will decrease Outcome: Progressing   Problem: Safety: Goal: Ability to remain free from injury will improve Outcome: Progressing

## 2021-06-03 ENCOUNTER — Encounter (HOSPITAL_COMMUNITY): Payer: Self-pay | Admitting: Orthopedic Surgery

## 2021-06-03 DIAGNOSIS — J849 Interstitial pulmonary disease, unspecified: Secondary | ICD-10-CM | POA: Diagnosis not present

## 2021-06-03 DIAGNOSIS — S72002A Fracture of unspecified part of neck of left femur, initial encounter for closed fracture: Secondary | ICD-10-CM | POA: Diagnosis not present

## 2021-06-03 LAB — BASIC METABOLIC PANEL
Anion gap: 6 (ref 5–15)
BUN: 28 mg/dL — ABNORMAL HIGH (ref 8–23)
CO2: 23 mmol/L (ref 22–32)
Calcium: 8 mg/dL — ABNORMAL LOW (ref 8.9–10.3)
Chloride: 100 mmol/L (ref 98–111)
Creatinine, Ser: 1.02 mg/dL — ABNORMAL HIGH (ref 0.44–1.00)
GFR, Estimated: 54 mL/min — ABNORMAL LOW (ref >60)
Glucose, Bld: 114 mg/dL — ABNORMAL HIGH (ref 70–99)
Potassium: 4.8 mmol/L (ref 3.5–5.1)
Sodium: 129 mmol/L — ABNORMAL LOW (ref 135–145)

## 2021-06-03 LAB — CBC WITH DIFFERENTIAL/PLATELET
Abs Immature Granulocytes: 0.07 10*3/uL (ref 0.00–0.07)
Basophils Absolute: 0 10*3/uL (ref 0.0–0.1)
Basophils Relative: 0 %
Eosinophils Absolute: 0 10*3/uL (ref 0.0–0.5)
Eosinophils Relative: 0 %
HCT: 22.5 % — ABNORMAL LOW (ref 36.0–46.0)
Hemoglobin: 7.4 g/dL — ABNORMAL LOW (ref 12.0–15.0)
Immature Granulocytes: 1 %
Lymphocytes Relative: 13 %
Lymphs Abs: 1.6 10*3/uL (ref 0.7–4.0)
MCH: 33.6 pg (ref 26.0–34.0)
MCHC: 32.9 g/dL (ref 30.0–36.0)
MCV: 102.3 fL — ABNORMAL HIGH (ref 80.0–100.0)
Monocytes Absolute: 1.3 10*3/uL — ABNORMAL HIGH (ref 0.1–1.0)
Monocytes Relative: 11 %
Neutro Abs: 9.4 10*3/uL — ABNORMAL HIGH (ref 1.7–7.7)
Neutrophils Relative %: 75 %
Platelets: 302 10*3/uL (ref 150–400)
RBC: 2.2 MIL/uL — ABNORMAL LOW (ref 3.87–5.11)
RDW: 13.6 % (ref 11.5–15.5)
WBC: 12.4 10*3/uL — ABNORMAL HIGH (ref 4.0–10.5)
nRBC: 0 % (ref 0.0–0.2)

## 2021-06-03 LAB — SODIUM, URINE, RANDOM: Sodium, Ur: <10 mmol/L

## 2021-06-03 LAB — OSMOLALITY: Osmolality: 278 mOsm/kg (ref 275–295)

## 2021-06-03 MED ORDER — METHOCARBAMOL 500 MG PO TABS: 500.0000 mg | ORAL_TABLET | Freq: Three times a day (TID) | ORAL | 0 refills | Status: DC | PRN

## 2021-06-03 MED ORDER — SODIUM CHLORIDE 0.9 % IV SOLN: INTRAVENOUS | Status: DC

## 2021-06-03 MED ORDER — HYDROCODONE-ACETAMINOPHEN 5-325 MG PO TABS: 1.0000 | ORAL_TABLET | Freq: Four times a day (QID) | ORAL | 0 refills | Status: DC | PRN

## 2021-06-03 MED ORDER — DOCUSATE SODIUM 100 MG PO CAPS: 100.0000 mg | ORAL_CAPSULE | Freq: Two times a day (BID) | ORAL | 0 refills | Status: DC

## 2021-06-03 MED ORDER — POLYETHYLENE GLYCOL 3350 17 G PO PACK: 17.0000 g | PACK | Freq: Every day | ORAL | 0 refills | Status: DC | PRN

## 2021-06-03 MED ORDER — ASPIRIN 81 MG PO CHEW: 81.0000 mg | CHEWABLE_TABLET | Freq: Two times a day (BID) | ORAL | 0 refills | Status: DC

## 2021-06-03 MED ORDER — TAMSULOSIN HCL 0.4 MG PO CAPS
0.4000 mg | ORAL_CAPSULE | Freq: Every day | ORAL | Status: DC
  Administered 2021-06-03 – 2021-06-15 (×12): 0.4 mg via ORAL
  Filled 2021-06-03 (×13): qty 1

## 2021-06-03 NOTE — Progress Notes (Signed)
PROGRESS NOTE    Catherine Terrell  WFU:[id number] DOB: 1933/01/12 DOA: 05/31/2021 PCP: System, Provider Not In    Brief Narrative:  87 y.o. female with medical history significant for ILD, hypothyroidism, HLD, MCI who presents by EMS after fall at home. Pt presented with L hip fracture. Orthopedic Surgery was consulted  Assessment & Plan:   Principal Problem:   Closed left hip fracture (HCC) Active Problems:   Interstitial lung disease (HCC)   Hypothyroidism   MCI (mild cognitive impairment)  Principal Problem:   Closed left hip fracture  -Orthopedics consulted now s/p surgery 8/28 -Cont with analgesia as tolerated -Continue per physical therapy   Active Problems:   Interstitial lung disease  -Stable and on minmal O2 support at this time -Remains stable     Hypothyroidism -Continue with levothyroxine as tolerated     MCI (mild cognitive impairment) -Pt is on Aricept and Namenda -Currently stable  DVT prophylaxis: SCD's Code Status: Full Family Communication: Pt in room, family not at bedside  Status is: Inpatient  Remains inpatient appropriate because:Inpatient level of care appropriate due to severity of illness  Dispo: The patient is from: Home              Anticipated d/c is to: SNF              Patient currently is not medically stable to d/c.   Difficult to place patient No    Consultants:  Orthopedic Surgery  Procedures:    Antimicrobials: Anti-infectives (From admission, onward)    Start     Dose/Rate Route Frequency Ordered Stop   06/02/21 1830  ceFAZolin (ANCEF) IVPB 2g/100 mL premix        2 g 200 mL/hr over 30 Minutes Intravenous Every 6 hours 06/02/21 1454 06/03/21 0117   06/02/21 0830  ceFAZolin (ANCEF) IVPB 2g/100 mL premix        2 g 200 mL/hr over 30 Minutes Intravenous On call to O.R. 06/02/21 0744 06/02/21 1228   06/02/21 0600  ceFAZolin (ANCEF) IVPB 2g/100 mL premix  Status:  Discontinued        2 g 200 mL/hr over 30 Minutes  Intravenous On call to O.R. 06/01/21 1333 06/02/21 0748       Subjective: Without complaints today  Objective: Vitals:   06/03/21 0354 06/03/21 0744 06/03/21 1313 06/03/21 1313  BP: 127/64 134/67 (!) 125/58   Pulse: 87 97 (!) 111 (!) 109  Resp: (!) 25 19 19    Temp: 98.6 F (37 C) 98.3 F (36.8 C) 98.1 F (36.7 C)   TempSrc: Oral  Oral   SpO2: 96% 96% 97% 96%  Weight:      Height:        Intake/Output Summary (Last 24 hours) at 06/03/2021 1825 Last data filed at 06/03/2021 0900 Gross per 24 hour  Intake 1300 ml  Output 470 ml  Net 830 ml    Filed Weights   05/31/21 2309 06/02/21 0233  Weight: 46.8 kg 46.8 kg    Examination: General exam: Conversant, in no acute distress Respiratory system: normal chest rise, clear, no audible wheezing Cardiovascular system: regular rhythm, s1-s2 Gastrointestinal system: Nondistended, nontender, pos BS Central nervous system: No seizures, no tremors Extremities: No cyanosis, no joint deformities Skin: No rashes, no pallor Psychiatry: Affect normal // no auditory hallucinations   Data Reviewed: I have personally reviewed following labs and imaging studies  CBC: Recent Labs  Lab 05/31/21 1956 06/01/21 0335 06/03/21 0539  WBC 13.4*  9.9 12.4*  NEUTROABS 11.4*  --  9.4*  HGB 10.4* 10.4* 7.4*  HCT 30.9* 30.8* 22.5*  MCV 100.3* 97.8 102.3*  PLT 378 355 302    Basic Metabolic Panel: Recent Labs  Lab 05/31/21 1956 06/01/21 0335 06/03/21 0330  NA 128* 130* 129*  K 5.9* 4.1 4.8  CL 97* 99 100  CO2 25 23 23   GLUCOSE 117* 117* 114*  BUN 15 14 28*  CREATININE 0.90 0.73 1.02*  CALCIUM 8.5* 8.4* 8.0*    GFR: Estimated Creatinine Clearance: 25.3 mL/min (A) (by C-G formula based on SCr of 1.02 mg/dL (H)). Liver Function Tests: No results for input(s): AST, ALT, ALKPHOS, BILITOT, PROT, ALBUMIN in the last 168 hours. No results for input(s): LIPASE, AMYLASE in the last 168 hours. No results for input(s): AMMONIA in the last  168 hours. Coagulation Profile: No results for input(s): INR, PROTIME in the last 168 hours. Cardiac Enzymes: No results for input(s): CKTOTAL, CKMB, CKMBINDEX, TROPONINI in the last 168 hours. BNP (last 3 results) No results for input(s): PROBNP in the last 8760 hours. HbA1C: No results for input(s): HGBA1C in the last 72 hours. CBG: No results for input(s): GLUCAP in the last 168 hours. Lipid Profile: No results for input(s): CHOL, HDL, LDLCALC, TRIG, CHOLHDL, LDLDIRECT in the last 72 hours. Thyroid Function Tests: No results for input(s): TSH, T4TOTAL, FREET4, T3FREE, THYROIDAB in the last 72 hours. Anemia Panel: No results for input(s): VITAMINB12, FOLATE, FERRITIN, TIBC, IRON, RETICCTPCT in the last 72 hours. Sepsis Labs: No results for input(s): PROCALCITON, LATICACIDVEN in the last 168 hours.  Recent Results (from the past 240 hour(s))  Resp Panel by RT-PCR (Flu A&B, Covid) Nasopharyngeal Swab     Status: None   Collection Time: 05/31/21  8:24 PM   Specimen: Nasopharyngeal Swab; Nasopharyngeal(NP) swabs in vial transport medium  Result Value Ref Range Status   SARS Coronavirus 2 by RT PCR NEGATIVE NEGATIVE Final    Comment: (NOTE) SARS-CoV-2 target nucleic acids are NOT DETECTED.  The SARS-CoV-2 RNA is generally detectable in upper respiratory specimens during the acute phase of infection. The lowest concentration of SARS-CoV-2 viral copies this assay can detect is 138 copies/mL. A negative result does not preclude SARS-Cov-2 infection and should not be used as the sole basis for treatment or other patient management decisions. A negative result may occur with  improper specimen collection/handling, submission of specimen other than nasopharyngeal swab, presence of viral mutation(s) within the areas targeted by this assay, and inadequate number of viral copies(<138 copies/mL). A negative result must be combined with clinical observations, patient history, and  epidemiological information. The expected result is Negative.  Fact Sheet for Patients:  06/02/21  Fact Sheet for Healthcare Providers:  BloggerCourse.com  This test is no t yet approved or cleared by the [place] FDA and  has been authorized for detection and/or diagnosis of SARS-CoV-2 by FDA under an Emergency Use Authorization (EUA). This EUA will remain  in effect (meaning this test can be used) for the duration of the COVID-19 declaration under Section 564(b)(1) of the Act, 21 U.S.C.section 360bbb-3(b)(1), unless the authorization is terminated  or revoked sooner.       Influenza A by PCR NEGATIVE NEGATIVE Final   Influenza B by PCR NEGATIVE NEGATIVE Final    Comment: (NOTE) The Xpert Xpress SARS-CoV-2/FLU/RSV plus assay is intended as an aid in the diagnosis of influenza from Nasopharyngeal swab specimens and should not be used as a sole basis for treatment.  Nasal washings and aspirates are unacceptable for Xpert Xpress SARS-CoV-2/FLU/RSV testing.  Fact Sheet for Patients: BloggerCourse.com  Fact Sheet for Healthcare Providers: SeriousBroker.it  This test is not yet approved or cleared by the [place] FDA and has been authorized for detection and/or diagnosis of SARS-CoV-2 by FDA under an Emergency Use Authorization (EUA). This EUA will remain in effect (meaning this test can be used) for the duration of the COVID-19 declaration under Section 564(b)(1) of the Act, 21 U.S.C. section 360bbb-3(b)(1), unless the authorization is terminated or revoked.  Performed at [facility], [address], [place], [place] [address]   Surgical PCR screen     Status: None   Collection Time: 06/01/21  6:31 PM   Specimen: Nasal Mucosa; Nasal Swab  Result Value Ref Range Status   MRSA, PCR NEGATIVE NEGATIVE Final   Staphylococcus aureus NEGATIVE  NEGATIVE Final    Comment: (NOTE) The Xpert SA Assay (FDA approved for NASAL specimens in patients 69 years of age and older), is one component of a comprehensive surveillance program. It is not intended to diagnose infection nor to guide or monitor treatment. Performed at [facility], [address], [place], [place] [address]       Radiology Studies: DG C-Arm 1-60 Min-No Report  Result Date: 06/02/2021 CLINICAL DATA:  Intramedullary rod fixation of left hip fracture. EXAM: OPERATIVE left HIP (WITH PELVIS IF PERFORMED) 4 VIEWS TECHNIQUE: Fluoroscopic spot image(s) were submitted for interpretation post-operatively. Radiation exposure index: 7.9638 mGy. COMPARISON:  May 31, 2021. FINDINGS: Four intraoperative fluoroscopic images were obtained of the left hip. These images demonstrate surgical internal fixation of intertrochanteric fracture of the proximal left femur. IMPRESSION: Fluoroscopic guidance provided during surgical internal fixation of proximal left femoral intertrochanteric fracture. Electronically Signed   By: Lupita Raider M.D.   On: 06/02/2021 13:53   DG HIP OPERATIVE UNILAT W OR W/O PELVIS LEFT  Result Date: 06/02/2021 CLINICAL DATA:  Intramedullary rod fixation of left hip fracture. EXAM: OPERATIVE left HIP (WITH PELVIS IF PERFORMED) 4 VIEWS TECHNIQUE: Fluoroscopic spot image(s) were submitted for interpretation post-operatively. Radiation exposure index: 7.9638 mGy. COMPARISON:  May 31, 2021. FINDINGS: Four intraoperative fluoroscopic images were obtained of the left hip. These images demonstrate surgical internal fixation of intertrochanteric fracture of the proximal left femur. IMPRESSION: Fluoroscopic guidance provided during surgical internal fixation of proximal left femoral intertrochanteric fracture. Electronically Signed   By: Lupita Raider M.D.   On: 06/02/2021 13:53    Scheduled Meds:  aspirin EC  325 mg Oral Daily   atorvastatin  10 mg  Oral Daily   Chlorhexidine Gluconate Cloth  6 each Topical Daily   docusate sodium  100 mg Oral BID   donepezil  5 mg Oral QHS   levothyroxine  100 mcg Oral Once per day on Sun Fri Sat   levothyroxine  50 mcg Oral Once per day on Mon Tue Wed Thu   memantine  5 mg Oral Daily   Pirfenidone  801 mg Oral TID   tamsulosin  0.4 mg Oral Daily   Continuous Infusions:  lactated ringers 60 mL/hr at 06/03/21 1043   methocarbamol (ROBAXIN) IV       LOS: 3 days   Rickey Barbara, MD [facility] Pager On Amion  If 7PM-7AM, please contact night-coverage 06/03/2021, 6:25 PM

## 2021-06-03 NOTE — Progress Notes (Signed)
Pt was due to void after the surgery. Pt complain pressure but was not able to void. Bladder scan showed 478 ml urine. Paged attending and received order for in and out cath. 470 ml urine drained. Pt states relief after that. Will continue to monitor patient.

## 2021-06-03 NOTE — TOC Initial Note (Signed)
Transition of Care [facility]) - Initial/Assessment Note    Patient Details  Name: Catherine Terrell MRN: [id number] Date of Birth: [birth year]  Transition of Care [facility]) CM/SW Contact:    Amada Jupiter, LCSW Phone Number: 06/03/2021, 3:02 PM  Clinical Narrative:                 Have spoken with pt's son, Ameet, to introduce self/ TOC role and discuss dc planning needs.  Son is, unfortunately in a very tough situation as pt has only [place] and Part B of Medicare.  Pt has NO INSURANCE coverage for SNF or even HH services.  Son reports that he has no help here and must continue to work as well as cultural concerns with helping his mother with ADLs/ toileting.  I have asked him to reach out to his brother and sister-in-law (in [place]) to discuss situation.  He does not feel he could afford to pay privately for SNF.  Have alerted MD and tx team of this situation and will continue to discuss with son to try an coordinate best dc plan.   Expected Discharge Plan: Home/Self Care Barriers to Discharge: Financial Resources, Inadequate or no insurance, Continued Medical Work up   Patient Goals and CMS Choice        Expected Discharge Plan and Services Expected Discharge Plan: Home/Self Care In-house Referral: Clinical Social Work                                            Prior Living Arrangements/Services     Patient language and need for interpreter reviewed:: Yes Do you feel safe going back to the place where you live?: Yes      Need for Family Participation in Patient Care: Yes (Comment) Care giver support system in place?: Yes (comment)   Criminal Activity/Legal Involvement Pertinent to Current Situation/Hospitalization: No - Comment as needed  Activities of Daily Living Home Assistive Devices/Equipment: Hearing aid, Dentures (specify type) (bilateral hearing aides, full dentures) ADL Screening (condition at time of admission) Patient's cognitive ability adequate to  safely complete daily activities?: Yes Is the patient deaf or have difficulty hearing?: Yes (totally deaf) Does the patient have difficulty seeing, even when wearing glasses/contacts?: No Does the patient have difficulty concentrating, remembering, or making decisions?: No Patient able to express need for assistance with ADLs?: Yes Does the patient have difficulty dressing or bathing?: Yes Independently performs ADLs?: No Communication: Independent Dressing (OT): Needs assistance Is this a change from baseline?: Change from baseline, expected to last >3 days Grooming: Independent Feeding: Independent Bathing: Needs assistance Is this a change from baseline?: Change from baseline, expected to last >3 days Toileting: Needs assistance Is this a change from baseline?: Change from baseline, expected to last >3days In/Out Bed: Needs assistance Is this a change from baseline?: Change from baseline, expected to last >3 days Walks in Home: Dependent Is this a change from baseline?: Change from baseline, expected to last >3 days Does the patient have difficulty walking or climbing stairs?: Yes Weakness of Legs: Left Weakness of Arms/Hands: None (pt has rod - thinks it is in her right arm)  Permission Sought/Granted Permission sought to share information with : Family Supports    Share Information with NAME: Ameet Hausner     Permission granted to share info w Relationship: son  Permission granted to share info w Contact  Information: [phone]  Emotional Assessment Appearance:: Appears stated age Attitude/Demeanor/Rapport: Unable to Assess Affect (typically observed): Unable to Assess Orientation: : Oriented to Self Alcohol / Substance Use: Not Applicable Psych Involvement: No (comment)  Admission diagnosis:  Closed left hip fracture (HCC) [S72.002A] Closed fracture of left hip, initial encounter [facility]) [S72.002A] Patient Active Problem List   Diagnosis Date Noted   Closed left hip  fracture (HCC) 05/31/2021   Interstitial lung disease (HCC) 05/31/2021   Hypothyroidism 05/31/2021   MCI (mild cognitive impairment) 05/31/2021   PCP:  System, Provider Not In Pharmacy:   [id number] - [place], [place] - [address] [place] [address] Phone: [phone] Fax: [phone]     Social Determinants of Health (SDOH) Interventions    Readmission Risk Interventions No flowsheet data found.

## 2021-06-03 NOTE — Progress Notes (Signed)
Patient ID: Catherine Terrell, female   DOB: 1933-02-16, 87 y.o.   MRN: [id number] Subjective: 1 Day Post-Op Procedure(s) (LRB): INTRAMEDULLARY (IM) NAIL INTERTROCHANTRIC (Left)    Patient reports pain as mild.  No events noted.  Objective:   VITALS:   Vitals:   06/03/21 1313 06/03/21 1313  BP: (!) 125/58   Pulse: (!) 111 (!) 109  Resp: 19   Temp: 98.1 F (36.7 C)   SpO2: 97% 96%    Neurovascular intact Incision: dressing C/D/I - left hip wound  LABS Recent Labs    05/31/21 1956 06/01/21 0335 06/03/21 0539  HGB 10.4* 10.4* 7.4*  HCT 30.9* 30.8* 22.5*  WBC 13.4* 9.9 12.4*  PLT 378 355 302    Recent Labs    05/31/21 1956 06/01/21 0335 06/03/21 0330  NA 128* 130* 129*  K 5.9* 4.1 4.8  BUN 15 14 28*  CREATININE 0.90 0.73 1.02*  GLUCOSE 117* 117* 114*    No results for input(s): LABPT, INR in the last 72 hours.   Assessment/Plan: 1 Day Post-Op Procedure(s) (LRB): INTRAMEDULLARY (IM) NAIL INTERTROCHANTRIC (Left)   Advance diet Up with therapy  Disposition likely to SNF related to inability to manage at home per conversation with her son who is very helpful  I would attempt to have her be PWB  ASA for DVT prophylaxis Minimize narcotics to minimize further confusion  RTC in 2 weeks

## 2021-06-03 NOTE — Evaluation (Signed)
Physical Therapy Evaluation Patient Details Name: Catherine Terrell MRN: [id number] DOB: [birth year] Today's Date: 06/03/2021   History of Present Illness  87 yo female adm after fall resulting in L IT hip fx. PMH: bil TKAs (done in [place]), dementia  Clinical Impression  Pt admitted with above diagnosis.  Pt cooperative however communication limited d/t pt HOH in addition to [place] not being her preferred language. See below for mobility. Recommend SNF unless family desires to take her home, would likely need HHPT and 24 hour assist    Pt currently with functional limitations due to the deficits listed below (see PT Problem List). Pt will benefit from skilled PT to increase their independence and safety with mobility to allow discharge to the venue listed below.       Follow Up Recommendations SNF (pending family desires)    Equipment Recommendations  Other (comment) (TBD)    Recommendations for Other Services       Precautions / Restrictions Precautions Precautions: Fall Restrictions LLE Weight Bearing: Partial weight bearing LLE Partial Weight Bearing Percentage or Pounds: 50      Mobility  Bed Mobility Overal bed mobility: Needs Assistance Bed Mobility: Supine to Sit;Sit to Supine     Supine to sit: Max assist Sit to supine: Max assist;+2 for safety/equipment;+2 for physical assistance   General bed mobility comments: assist with trunk and LEs to sitting and to return to supine    Transfers                    Ambulation/Gait                Stairs            Wheelchair Mobility    Modified Rankin (Stroke Patients Only)       Balance Overall balance assessment: Needs assistance Sitting-balance support: Feet supported Sitting balance-Leahy Scale: Poor Sitting balance - Comments: posterior bias, initially mod assist to maintain midline, progressed to min assist with incr time                                     Pertinent  Vitals/Pain Pain Assessment: Faces Pain Descriptors / Indicators: Grimacing Pain Intervention(s): Limited activity within patient's tolerance;Monitored during session;Repositioned;Ice applied    Home Living Family/patient expects to be discharged to:: Other (Comment) Living Arrangements: Children               Additional Comments: son    Prior Function           Comments: unable to determine from pt, no family present. appears she was ambulatory at baseline. attempted to use ipad after placing pt hearing aides however she was still unable to hear interpreter     Hand Dominance        Extremity/Trunk Assessment   Upper Extremity Assessment Upper Extremity Assessment: Overall WFL for tasks assessed    Lower Extremity Assessment Lower Extremity Assessment: LLE deficits/detail LLE Deficits / Details: P/AAROM grossly WFL LLE: Unable to fully assess due to pain       Communication   Communication: Prefers language other than English;HOH  Cognition Arousal/Alertness: Awake/alert Behavior During Therapy: WFL for tasks assessed/performed Overall Cognitive Status: History of cognitive impairments - at baseline  General Comments      Exercises     Assessment/Plan    PT Assessment Patient needs continued PT services  PT Problem List Decreased strength;Decreased activity tolerance;Decreased mobility;Decreased knowledge of use of DME;Decreased range of motion;Decreased balance;Pain;Decreased cognition       PT Treatment Interventions DME instruction;Therapeutic exercise;Gait training;Therapeutic activities;Patient/family education;Functional mobility training;Balance training    PT Goals (Current goals can be found in the Care Plan section)  Acute Rehab PT Goals PT Goal Formulation: Patient unable to participate in goal setting Time For Goal Achievement: 06/10/21 Potential to Achieve Goals: Good     Frequency Min 3X/week   Barriers to discharge        Co-evaluation               AM-PAC PT "6 Clicks" Mobility  Outcome Measure Help needed turning from your back to your side while in a flat bed without using bedrails?: A Lot Help needed moving from lying on your back to sitting on the side of a flat bed without using bedrails?: A Lot Help needed moving to and from a bed to a chair (including a wheelchair)?: Total Help needed standing up from a chair using your arms (e.g., wheelchair or bedside chair)?: Total Help needed to walk in hospital room?: Total Help needed climbing 3-5 steps with a railing? : Total 6 Click Score: 8    End of Session   Activity Tolerance: Patient limited by pain;Patient limited by fatigue Patient left: in bed;with call bell/phone within reach;with bed alarm set   PT Visit Diagnosis: Other abnormalities of gait and mobility (R26.89)    Time: 3825-0539 PT Time Calculation (min) (ACUTE ONLY): 30 min   Charges:   PT Evaluation $PT Eval Low Complexity: 1 Low PT Treatments $Gait Training: 8-22 mins        Delice Bison, PT  Acute Rehab Dept ([facility]) [phone] Pager [phone]  06/03/2021   [facility] 06/03/2021, 1:17 PM

## 2021-06-04 ENCOUNTER — Institutional Professional Consult (permissible substitution): Payer: Medicare Other | Admitting: Pulmonary Disease

## 2021-06-04 DIAGNOSIS — S72002A Fracture of unspecified part of neck of left femur, initial encounter for closed fracture: Secondary | ICD-10-CM | POA: Diagnosis not present

## 2021-06-04 DIAGNOSIS — J849 Interstitial pulmonary disease, unspecified: Secondary | ICD-10-CM | POA: Diagnosis not present

## 2021-06-04 LAB — CBC
HCT: 17.5 % — ABNORMAL LOW (ref 36.0–46.0)
Hemoglobin: 5.7 g/dL — CL (ref 12.0–15.0)
MCH: 33.1 pg (ref 26.0–34.0)
MCHC: 32.6 g/dL (ref 30.0–36.0)
MCV: 101.7 fL — ABNORMAL HIGH (ref 80.0–100.0)
Platelets: 262 10*3/uL (ref 150–400)
RBC: 1.72 MIL/uL — ABNORMAL LOW (ref 3.87–5.11)
RDW: 13.7 % (ref 11.5–15.5)
WBC: 9.1 10*3/uL (ref 4.0–10.5)
nRBC: 0 % (ref 0.0–0.2)

## 2021-06-04 LAB — HEMOGLOBIN AND HEMATOCRIT, BLOOD
HCT: 32.5 % — ABNORMAL LOW (ref 36.0–46.0)
Hemoglobin: 10.8 g/dL — ABNORMAL LOW (ref 12.0–15.0)

## 2021-06-04 LAB — BASIC METABOLIC PANEL
Anion gap: 4 — ABNORMAL LOW (ref 5–15)
BUN: 24 mg/dL — ABNORMAL HIGH (ref 8–23)
CO2: 24 mmol/L (ref 22–32)
Calcium: 7.7 mg/dL — ABNORMAL LOW (ref 8.9–10.3)
Chloride: 103 mmol/L (ref 98–111)
Creatinine, Ser: 0.84 mg/dL (ref 0.44–1.00)
GFR, Estimated: >60 mL/min (ref >60)
Glucose, Bld: 99 mg/dL (ref 70–99)
Potassium: 4.4 mmol/L (ref 3.5–5.1)
Sodium: 131 mmol/L — ABNORMAL LOW (ref 135–145)

## 2021-06-04 LAB — PREPARE RBC (CROSSMATCH)

## 2021-06-04 LAB — OSMOLALITY, URINE: Osmolality, Ur: 520 mOsm/kg (ref 300–900)

## 2021-06-04 MED ORDER — HALOPERIDOL LACTATE 5 MG/ML IJ SOLN: 2.0000 mg | Freq: Four times a day (QID) | INTRAMUSCULAR | Status: DC | PRN

## 2021-06-04 MED ORDER — LORAZEPAM 2 MG/ML IJ SOLN
0.5000 mg | Freq: Once | INTRAMUSCULAR | Status: AC
  Administered 2021-06-04: 0.5 mg via INTRAVENOUS
  Filled 2021-06-04: qty 0.25

## 2021-06-04 MED ORDER — SODIUM CHLORIDE 0.9% IV SOLUTION: Freq: Once | INTRAVENOUS | Status: AC

## 2021-06-04 NOTE — Progress Notes (Signed)
   06/04/21 1425  Transfuse RBC  Volume 147.92  Patient confused and pulled at IV. Line capped. IV removed. Patient combative. IV insertion attempted twice.IV consult placed. MD notified.  Lidia Collum, RN

## 2021-06-04 NOTE — Progress Notes (Signed)
Patient ID: Catherine Terrell, female   DOB: November 11, 1932, 87 y.o.   MRN: [id number]   06/04/21 1731  Restraint Order  Length of Order Daily  Assessment  Less Restrictive Interventions Attempted Yes  Health history reviewed prior to applying restraint Yes  Justification  Clinical Justification Pulling lines;Pulling tubes;Removal of equipment;Removal of dressing  Plan to Progress Out Family participation;Reality orientation;Interdisciplinary collaboration;Continue safety plan;Reevaluate safety plan  Education  Discontinuation Criteria No longer interfering with care  Discontinuation Criteria Explained Yes  Family Notification Other Family (son)  Restraint Every 2 Hour Monitoring  Airway Clear with Spontaneous Respirations Yes  Circulation / Skin Integrity No signs of injury  Emotional / Mental Status Agitated/restless;Confused;Verbally abusive  Range of Motion Performed  Food and Fluids Fluid/Food offered;Fluid/Food taken;Patient declined  Elimination Patient declined  Patient's rights, dignity, safety maintained Yes  Can Restraints be Less Restrictive or Discontinued? No  Medical Device Prophylactic Dressing Under Restraints  Restraints - Date Prophylactic Dressing Applied (if applicable) 06/04/21  Skin Assessed Under All Medical Device Prophylactic Dressings (if applicable) Done - Skin Intact  Non-violent Restraints  Soft Restraint Right Wrist Start  Soft Restraint Left Wrist Start   Son, Catherine Terrell, notified. Education provided. Stated understanding.  Catherine Collum, RN

## 2021-06-04 NOTE — Progress Notes (Signed)
PROGRESS NOTE    Catherine Terrell  YQM:[id number] DOB: Feb 04, 1933 DOA: 05/31/2021 PCP: System, Provider Not In    Brief Narrative:  87 y.o. female with medical history significant for ILD, hypothyroidism, HLD, MCI who presents by EMS after fall at home. Pt presented with L hip fracture. Orthopedic Surgery was consulted  Assessment & Plan:   Principal Problem:   Closed left hip fracture (HCC) Active Problems:   Interstitial lung disease (HCC)   Hypothyroidism   MCI (mild cognitive impairment)  Principal Problem:   Closed left hip fracture  -Orthopedics consulted now s/p surgery 8/28 -Cont with analgesia as tolerated -Continue with physical therapy as tolerated   Active Problems:   Interstitial lung disease  -Stable and on minmal O2 support at this time -Seems to be stable at this time     Hypothyroidism -Continue with levothyroxine as tolerated     MCI (mild cognitive impairment) -Pt is on Aricept and Namenda -Presently stable  Acute blood loss anemia -Hemoglobin this morning noted to be 5.7, suspect related to recent surgery -2 units of PRBCs ordered, currently being given -Patient is otherwise hemodynamically stable -Repeat CBC in the morning  Bladder outlet obstruction -Recently noted to have evidence of bladder outlet obstruction, requiring serial in and out caths. -Flomax started on 06/03/2021 -Patient reportedly pulled out Foley catheter overnight with no obvious sign of trauma and no reported hematuria -Would continue with bladder scanning every shift with I/O cath if greater than 300 cc on bladder scan  DVT prophylaxis: SCD's Code Status: Full Family Communication: Pt in room, family not at bedside  Status is: Inpatient  Remains inpatient appropriate because:Inpatient level of care appropriate due to severity of illness  Dispo: The patient is from: Home              Anticipated d/c is to: SNF              Patient currently is not medically stable to  d/c.   Difficult to place patient No   Consultants:  Orthopedic Surgery  Procedures:  Open reduction internal fixation with a trochanteric intramedullary nail to proximal left femur  Antimicrobials: Anti-infectives (From admission, onward)    Start     Dose/Rate Route Frequency Ordered Stop   06/02/21 1830  ceFAZolin (ANCEF) IVPB 2g/100 mL premix        2 g 200 mL/hr over 30 Minutes Intravenous Every 6 hours 06/02/21 1454 06/03/21 0117   06/02/21 0830  ceFAZolin (ANCEF) IVPB 2g/100 mL premix        2 g 200 mL/hr over 30 Minutes Intravenous On call to O.R. 06/02/21 0744 06/02/21 1228   06/02/21 0600  ceFAZolin (ANCEF) IVPB 2g/100 mL premix  Status:  Discontinued        2 g 200 mL/hr over 30 Minutes Intravenous On call to O.R. 06/01/21 1333 06/02/21 0748       Subjective: More agitated today  Objective: Vitals:   06/04/21 0849 06/04/21 1111 06/04/21 1300 06/04/21 1329  BP: 120/61 (!) 156/80 119/80 (!) 149/80  Pulse: (!) 102 (!) 108 91 94  Resp: 16 17 17 16   Temp: 98.4 F (36.9 C) 98.2 F (36.8 C) 98.5 F (36.9 C) 98.2 F (36.8 C)  TempSrc: Oral Oral Oral Oral  SpO2: 96% 97% 95% 94%  Weight:      Height:        Intake/Output Summary (Last 24 hours) at 06/04/2021 1546 Last data filed at 06/04/2021 1312 Gross per 24  hour  Intake 2231.02 ml  Output 775 ml  Net 1456.02 ml    Filed Weights   05/31/21 2309 06/02/21 0233  Weight: 46.8 kg 46.8 kg    Examination: General exam: Awake, laying in bed, in nad Respiratory system: Normal respiratory effort, no wheezing Cardiovascular system: regular rate, s1, s2 Gastrointestinal system: Soft, nondistended, positive BS Central nervous system: CN2-12 grossly intact, strength intact Extremities: Perfused, no clubbing Skin: Normal skin turgor, no notable skin lesions seen Psychiatry: Mood normal // no visual hallucinations   Data Reviewed: I have personally reviewed following labs and imaging studies  CBC: Recent Labs   Lab 05/31/21 1956 06/01/21 0335 06/03/21 0539 06/04/21 0320  WBC 13.4* 9.9 12.4* 9.1  NEUTROABS 11.4*  --  9.4*  --   HGB 10.4* 10.4* 7.4* 5.7*  HCT 30.9* 30.8* 22.5* 17.5*  MCV 100.3* 97.8 102.3* 101.7*  PLT 378 355 302 262    Basic Metabolic Panel: Recent Labs  Lab 05/31/21 1956 06/01/21 0335 06/03/21 0330 06/04/21 0320  NA 128* 130* 129* 131*  K 5.9* 4.1 4.8 4.4  CL 97* 99 100 103  CO2 25 23 23 24   GLUCOSE 117* 117* 114* 99  BUN 15 14 28* 24*  CREATININE 0.90 0.73 1.02* 0.84  CALCIUM 8.5* 8.4* 8.0* 7.7*    GFR: Estimated Creatinine Clearance: 30.7 mL/min (by C-G formula based on SCr of 0.84 mg/dL). Liver Function Tests: No results for input(s): AST, ALT, ALKPHOS, BILITOT, PROT, ALBUMIN in the last 168 hours. No results for input(s): LIPASE, AMYLASE in the last 168 hours. No results for input(s): AMMONIA in the last 168 hours. Coagulation Profile: No results for input(s): INR, PROTIME in the last 168 hours. Cardiac Enzymes: No results for input(s): CKTOTAL, CKMB, CKMBINDEX, TROPONINI in the last 168 hours. BNP (last 3 results) No results for input(s): PROBNP in the last 8760 hours. HbA1C: No results for input(s): HGBA1C in the last 72 hours. CBG: No results for input(s): GLUCAP in the last 168 hours. Lipid Profile: No results for input(s): CHOL, HDL, LDLCALC, TRIG, CHOLHDL, LDLDIRECT in the last 72 hours. Thyroid Function Tests: No results for input(s): TSH, T4TOTAL, FREET4, T3FREE, THYROIDAB in the last 72 hours. Anemia Panel: No results for input(s): VITAMINB12, FOLATE, FERRITIN, TIBC, IRON, RETICCTPCT in the last 72 hours. Sepsis Labs: No results for input(s): PROCALCITON, LATICACIDVEN in the last 168 hours.  Recent Results (from the past 240 hour(s))  Resp Panel by RT-PCR (Flu A&B, Covid) Nasopharyngeal Swab     Status: None   Collection Time: 05/31/21  8:24 PM   Specimen: Nasopharyngeal Swab; Nasopharyngeal(NP) swabs in vial transport medium  Result  Value Ref Range Status   SARS Coronavirus 2 by RT PCR NEGATIVE NEGATIVE Final    Comment: (NOTE) SARS-CoV-2 target nucleic acids are NOT DETECTED.  The SARS-CoV-2 RNA is generally detectable in upper respiratory specimens during the acute phase of infection. The lowest concentration of SARS-CoV-2 viral copies this assay can detect is 138 copies/mL. A negative result does not preclude SARS-Cov-2 infection and should not be used as the sole basis for treatment or other patient management decisions. A negative result may occur with  improper specimen collection/handling, submission of specimen other than nasopharyngeal swab, presence of viral mutation(s) within the areas targeted by this assay, and inadequate number of viral copies(<138 copies/mL). A negative result must be combined with clinical observations, patient history, and epidemiological information. The expected result is Negative.  Fact Sheet for Patients:  06/02/21  Fact Sheet  for Healthcare Providers:  SeriousBroker.it  This test is no t yet approved or cleared by the [place] and  has been authorized for detection and/or diagnosis of SARS-CoV-2 by FDA under an Emergency Use Authorization (EUA). This EUA will remain  in effect (meaning this test can be used) for the duration of the COVID-19 declaration under Section 564(b)(1) of the Act, 21 U.S.C.section 360bbb-3(b)(1), unless the authorization is terminated  or revoked sooner.       Influenza A by PCR NEGATIVE NEGATIVE Final   Influenza B by PCR NEGATIVE NEGATIVE Final    Comment: (NOTE) The Xpert Xpress SARS-CoV-2/FLU/RSV plus assay is intended as an aid in the diagnosis of influenza from Nasopharyngeal swab specimens and should not be used as a sole basis for treatment. Nasal washings and aspirates are unacceptable for Xpert Xpress SARS-CoV-2/FLU/RSV testing.  Fact Sheet for  Patients: BloggerCourse.com  Fact Sheet for Healthcare Providers: SeriousBroker.it  This test is not yet approved or cleared by the [place] FDA and has been authorized for detection and/or diagnosis of SARS-CoV-2 by FDA under an Emergency Use Authorization (EUA). This EUA will remain in effect (meaning this test can be used) for the duration of the COVID-19 declaration under Section 564(b)(1) of the Act, 21 U.S.C. section 360bbb-3(b)(1), unless the authorization is terminated or revoked.  Performed at [facility], [address], [place], [place] [address]   Surgical PCR screen     Status: None   Collection Time: 06/01/21  6:31 PM   Specimen: Nasal Mucosa; Nasal Swab  Result Value Ref Range Status   MRSA, PCR NEGATIVE NEGATIVE Final   Staphylococcus aureus NEGATIVE NEGATIVE Final    Comment: (NOTE) The Xpert SA Assay (FDA approved for NASAL specimens in patients 87 years of age and older), is one component of a comprehensive surveillance program. It is not intended to diagnose infection nor to guide or monitor treatment. Performed at [facility], [address], [place], [place] [address]       Radiology Studies: No results found.  Scheduled Meds:  aspirin EC  325 mg Oral Daily   atorvastatin  10 mg Oral Daily   Chlorhexidine Gluconate Cloth  6 each Topical Daily   docusate sodium  100 mg Oral BID   donepezil  5 mg Oral QHS   levothyroxine  100 mcg Oral Once per day on Sun Fri Sat   levothyroxine  50 mcg Oral Once per day on Mon Tue Wed Thu   memantine  5 mg Oral Daily   Pirfenidone  801 mg Oral TID   tamsulosin  0.4 mg Oral Daily   Continuous Infusions:  sodium chloride 75 mL/hr at 06/03/21 1951   lactated ringers Stopped (06/03/21 1950)   methocarbamol (ROBAXIN) IV       LOS: 4 days   Rickey Barbara, MD [facility] Pager On Amion  If 7PM-7AM, please  contact night-coverage 06/04/2021, 3:46 PM

## 2021-06-04 NOTE — Progress Notes (Signed)
Patient ID: Catherine Terrell, female   DOB: October 15, 1932, 87 y.o.   MRN: [id number]  Attempted translator x 2. Patient does not wish to speak with someone she does not know per translator.  Lidia Collum, RN

## 2021-06-04 NOTE — Progress Notes (Signed)
PT Cancellation Note  Patient Details Name: Catherine Terrell MRN: [id number] DOB: 03/05/1933   Cancelled Treatment:    Reason Eval/Treat Not Completed: Medical issues which prohibited therapy.  Hgb 5.7, PT deferred at this time, continue efforts   [facility] 06/04/2021, 2:15 PM

## 2021-06-04 NOTE — Progress Notes (Signed)
Orthopedic Tech Progress Note Patient Details:  Catherine Terrell 1933/08/15 [id number]  Patient ID: Catherine Terrell, female   DOB: August 12, 1933, 87 y.o.   MRN: [id number]  Catherine Terrell 06/04/2021, 2:54 PMpickup [facility] tx

## 2021-06-04 NOTE — Progress Notes (Signed)
   06/04/21 1650  Transfuse RBC  Volume 0  IV team able to insert IV in upper arm via ultrasound. Charge RN Dainna consulted. Blood will expire in less than 30 minutes. Removed blood, MD notified. Awaiting orders.  Lidia Collum, RN

## 2021-06-04 NOTE — Progress Notes (Signed)
Patient ID: Catherine Terrell, female   DOB: 06-23-1933, 87 y.o.   MRN: [id number]   Attempted translator again. Female interpreter, patient still refusing to listen or talk to someone she does not know.  Lidia Collum, RN

## 2021-06-04 NOTE — Progress Notes (Signed)
Date and time results received: 06/04/21 04:05am  Test: Hgb Critical Value: 5.7  Name of Provider Notified: Chinita Greenland, NP  Orders Received? Or Actions Taken?: See new orders

## 2021-06-05 DIAGNOSIS — E039 Hypothyroidism, unspecified: Secondary | ICD-10-CM | POA: Diagnosis not present

## 2021-06-05 DIAGNOSIS — S72002A Fracture of unspecified part of neck of left femur, initial encounter for closed fracture: Secondary | ICD-10-CM | POA: Diagnosis not present

## 2021-06-05 LAB — TYPE AND SCREEN
ABO/RH(D): A POS
Antibody Screen: NEGATIVE
Unit division: 0
Unit division: 0

## 2021-06-05 LAB — COMPREHENSIVE METABOLIC PANEL
ALT: 45 U/L — ABNORMAL HIGH (ref 0–44)
AST: 107 U/L — ABNORMAL HIGH (ref 15–41)
Albumin: 2.5 g/dL — ABNORMAL LOW (ref 3.5–5.0)
Alkaline Phosphatase: 45 U/L (ref 38–126)
Anion gap: 9 (ref 5–15)
BUN: 15 mg/dL (ref 8–23)
CO2: 25 mmol/L (ref 22–32)
Calcium: 8.3 mg/dL — ABNORMAL LOW (ref 8.9–10.3)
Chloride: 99 mmol/L (ref 98–111)
Creatinine, Ser: 0.5 mg/dL (ref 0.44–1.00)
GFR, Estimated: >60 mL/min (ref >60)
Glucose, Bld: 120 mg/dL — ABNORMAL HIGH (ref 70–99)
Potassium: 3.8 mmol/L (ref 3.5–5.1)
Sodium: 133 mmol/L — ABNORMAL LOW (ref 135–145)
Total Bilirubin: 0.8 mg/dL (ref 0.3–1.2)
Total Protein: 5.9 g/dL — ABNORMAL LOW (ref 6.5–8.1)

## 2021-06-05 LAB — CBC
HCT: 33.7 % — ABNORMAL LOW (ref 36.0–46.0)
Hemoglobin: 11.6 g/dL — ABNORMAL LOW (ref 12.0–15.0)
MCH: 31.6 pg (ref 26.0–34.0)
MCHC: 34.4 g/dL (ref 30.0–36.0)
MCV: 91.8 fL (ref 80.0–100.0)
Platelets: 295 10*3/uL (ref 150–400)
RBC: 3.67 MIL/uL — ABNORMAL LOW (ref 3.87–5.11)
RDW: 17.1 % — ABNORMAL HIGH (ref 11.5–15.5)
WBC: 11.5 10*3/uL — ABNORMAL HIGH (ref 4.0–10.5)
nRBC: 0 % (ref 0.0–0.2)

## 2021-06-05 LAB — BPAM RBC
Blood Product Expiration Date: 202209212359
Blood Product Expiration Date: 202209212359
ISSUE DATE / TIME: 202208300814
ISSUE DATE / TIME: 202208301306
Unit Type and Rh: 6200
Unit Type and Rh: 6200

## 2021-06-05 NOTE — Plan of Care (Signed)
  Problem: Nutrition: Goal: Adequate nutrition will be maintained Outcome: Progressing   Problem: Elimination: Goal: Will not experience complications related to urinary retention Outcome: Progressing   Problem: Pain Managment: Goal: General experience of comfort will improve Outcome: Progressing   Problem: Skin Integrity: Goal: Risk for impaired skin integrity will decrease Outcome: Progressing   Problem: Safety: Goal: Non-violent Restraint(s) Outcome: Progressing

## 2021-06-05 NOTE — Progress Notes (Addendum)
Physical Therapy Treatment Patient Details Name: Catherine Terrell MRN: [id number] DOB: 1933/02/19 Today's Date: 06/05/2021    History of Present Illness 87 yo female adm after fall resulting in L IT hip fx. PMH: bil TKAs (done in [place]), dementia    PT Comments    Pt progressing slowly. Requring total assist for bed mobility, unable to attempt standing. Limited by multiple communication barriers. We may need to have son return hearing aides and have an in person translator next session, and/or son present.   Follow Up Recommendations  Other (comment) (?? has no benefits)     Equipment Recommendations  Rolling walker with 5" wheels (youth RW)    Recommendations for Other Services       Precautions / Restrictions Precautions Precautions: Fall Restrictions LLE Weight Bearing: Partial weight bearing LLE Partial Weight Bearing Percentage or Pounds: 50%    Mobility  Bed Mobility Overal bed mobility: Needs Assistance Bed Mobility: Supine to Sit;Sit to Supine     Supine to sit: Max assist;+2 for physical assistance;+2 for safety/equipment Sit to supine: Total assist;+2 for physical assistance;+2 for safety/equipment   General bed mobility comments: assist with trunk and LEs to sitting and to return to supine    Transfers                    Ambulation/Gait                 Stairs             Wheelchair Mobility    Modified Rankin (Stroke Patients Only)       Balance   Sitting-balance support: Feet supported Sitting balance-Leahy Scale: Poor Sitting balance - Comments: posterior bias, unable to maintain sitting EOB, c/o pain with hip flexion                                    Cognition Arousal/Alertness: Awake/alert Behavior During Therapy: Restless Overall Cognitive Status: History of cognitive impairments - at baseline                                 General Comments: communication is difficult, pt is  non-english speaking (speaks Hindi) and HOH (no hearing aids today) pt spoke some English last session however seems unable to hear/comprehend today without hearing aids, also baseline dementia      Exercises      General Comments        Pertinent Vitals/Pain Pain Assessment: Faces Faces Pain Scale: Hurts even more Pain Location: L hip/thigh Pain Descriptors / Indicators: Grimacing;Moaning Pain Intervention(s): Limited activity within patient's tolerance;Monitored during session;Repositioned;RN gave pain meds during session    Home Living                      Prior Function            PT Goals (current goals can now be found in the care plan section) Acute Rehab PT Goals Patient Stated Goal: unable to state PT Goal Formulation: Patient unable to participate in goal setting Time For Goal Achievement: 06/10/21 Potential to Achieve Goals: Fair Progress towards PT goals: Progressing toward goals    Frequency    Min 3X/week      PT Plan Current plan remains appropriate    Co-evaluation  AM-PAC PT "6 Clicks" Mobility   Outcome Measure  Help needed turning from your back to your side while in a flat bed without using bedrails?: A Lot Help needed moving from lying on your back to sitting on the side of a flat bed without using bedrails?: Total Help needed moving to and from a bed to a chair (including a wheelchair)?: Total Help needed standing up from a chair using your arms (e.g., wheelchair or bedside chair)?: Total Help needed to walk in hospital room?: Total Help needed climbing 3-5 steps with a railing? : Total 6 Click Score: 7    End of Session   Activity Tolerance: Patient limited by pain;Patient limited by fatigue;Other (comment) (communication barriers) Patient left: in bed;with call bell/phone within reach;with bed alarm set   PT Visit Diagnosis: Other abnormalities of gait and mobility (R26.89)     Time: 1791-5056 PT Time  Calculation (min) (ACUTE ONLY): 19 min  Charges:  $Therapeutic Activity: 23-37 mins                     Delice Bison, PT  Acute Rehab Dept ([facility]) [phone] Pager [phone]  06/05/2021    [facility] 06/05/2021, 1:54 PM

## 2021-06-05 NOTE — Progress Notes (Signed)
PROGRESS NOTE    Catherine Terrell  OVF:[id number] DOB: 1933/01/29 DOA: 05/31/2021 PCP: System, Provider Not In    Brief Narrative:  87 y.o. female with medical history significant for ILD, hypothyroidism, HLD, cognitive impairment who presents by EMS after fall at home. Pt presented with L hip fracture.  Admitted for surgical stabilization.  Assessment & Plan:   Principal Problem:   Closed left hip fracture (HCC) Active Problems:   Interstitial lung disease (HCC)   Hypothyroidism   MCI (mild cognitive impairment)  Principal Problem: Closed traumatic left hip fracture: Closed left hip fracture  Status post ORIF with IM nail intertrochanteric left 8/28 Adequate pain medications, mostly controlled with oral Vicodin and Tylenol with Robaxin.  Use laxatives to avoid constipation. Aspirin 325 mg daily for DVT prophylaxis. Partial weightbearing left leg.  Continue PT OT.  Difficult disposition with no adequate support at home.   Active Problems:   Interstitial lung disease  -Stable and on minmal O2 support at this time -Seems to be stable at this time     Hypothyroidism -Continue with levothyroxine as tolerated     MCI (mild cognitive impairment) -Pt is on Aricept and Namenda -Presently stable  Acute blood loss anemia Require 2 units of PRBC.  Stabilized.  Bladder outlet obstruction -Recently noted to have evidence of bladder outlet obstruction, requiring serial in and out caths. -Flomax started on 06/03/2021 -Urinating adequately.  DVT prophylaxis: SCD's Code Status: Full Family Communication: None.  Status is: Inpatient  Remains inpatient appropriate because:Inpatient level of care appropriate due to severity of illness  Dispo: The patient is from: Home              Anticipated d/c is to: SNF              Patient currently is not medically stable to d/c.   Difficult to place patient No   Consultants:  Orthopedic Surgery  Procedures:  Open reduction internal  fixation with a trochanteric intramedullary nail to proximal left femur  Antimicrobials: Anti-infectives (From admission, onward)    Start     Dose/Rate Route Frequency Ordered Stop   06/02/21 1830  ceFAZolin (ANCEF) IVPB 2g/100 mL premix        2 g 200 mL/hr over 30 Minutes Intravenous Every 6 hours 06/02/21 1454 06/03/21 0117   06/02/21 0830  ceFAZolin (ANCEF) IVPB 2g/100 mL premix        2 g 200 mL/hr over 30 Minutes Intravenous On call to O.R. 06/02/21 0744 06/02/21 1228   06/02/21 0600  ceFAZolin (ANCEF) IVPB 2g/100 mL premix  Status:  Discontinued        2 g 200 mL/hr over 30 Minutes Intravenous On call to O.R. 06/01/21 1333 06/02/21 0748       Subjective: Seen and examined.  Sleepy.  Could not wake up.  No overnight events noted.  Objective: Vitals:   06/04/21 2110 06/05/21 0545 06/05/21 0555 06/05/21 1311  BP: (!) 173/94 (!) 203/102 (!) 159/85 (!) 134/100  Pulse: (!) 101 98 97 (!) 110  Resp: 18 17  19   Temp:    98.2 F (36.8 C)  TempSrc:      SpO2:   97% (!) 74%  Weight:      Height:        Intake/Output Summary (Last 24 hours) at 06/05/2021 1434 Last data filed at 06/05/2021 1341 Gross per 24 hour  Intake 40 ml  Output --  Net 40 ml   06/07/2021  05/31/21 2309 06/02/21 0233  Weight: 46.8 kg 46.8 kg    Examination:  General: Sleepy and lethargic.  Unable to keep up any conversation.  On room air. Cardiovascular: S1-S2 normal.  Regular rate rhythm. Respiratory: Bilateral clear.  No added sounds. Gastrointestinal: Soft and nontender.  Bowel sounds present. Ext: Left lateral thigh incision clean and dry.  Distal neurovascular status intact. Neuro: Alert on stimulation, not oriented.  Unable to keep up conversation.   Data Reviewed: I have personally reviewed following labs and imaging studies  CBC: Recent Labs  Lab 05/31/21 1956 06/01/21 0335 06/03/21 0539 06/04/21 0320 06/04/21 2012 06/05/21 0325  WBC 13.4* 9.9 12.4* 9.1  --  11.5*   NEUTROABS 11.4*  --  9.4*  --   --   --   HGB 10.4* 10.4* 7.4* 5.7* 10.8* 11.6*  HCT 30.9* 30.8* 22.5* 17.5* 32.5* 33.7*  MCV 100.3* 97.8 102.3* 101.7*  --  91.8  PLT 378 355 302 262  --  295   Basic Metabolic Panel: Recent Labs  Lab 05/31/21 1956 06/01/21 0335 06/03/21 0330 06/04/21 0320 06/05/21 0325  NA 128* 130* 129* 131* 133*  K 5.9* 4.1 4.8 4.4 3.8  CL 97* 99 100 103 99  CO2 25 23 23 24 25   GLUCOSE 117* 117* 114* 99 120*  BUN 15 14 28* 24* 15  CREATININE 0.90 0.73 1.02* 0.84 0.50  CALCIUM 8.5* 8.4* 8.0* 7.7* 8.3*   GFR: Estimated Creatinine Clearance: 32.3 mL/min (by C-G formula based on SCr of 0.5 mg/dL). Liver Function Tests: Recent Labs  Lab 06/05/21 0325  AST 107*  ALT 45*  ALKPHOS 45  BILITOT 0.8  PROT 5.9*  ALBUMIN 2.5*   No results for input(s): LIPASE, AMYLASE in the last 168 hours. No results for input(s): AMMONIA in the last 168 hours. Coagulation Profile: No results for input(s): INR, PROTIME in the last 168 hours. Cardiac Enzymes: No results for input(s): CKTOTAL, CKMB, CKMBINDEX, TROPONINI in the last 168 hours. BNP (last 3 results) No results for input(s): PROBNP in the last 8760 hours. HbA1C: No results for input(s): HGBA1C in the last 72 hours. CBG: No results for input(s): GLUCAP in the last 168 hours. Lipid Profile: No results for input(s): CHOL, HDL, LDLCALC, TRIG, CHOLHDL, LDLDIRECT in the last 72 hours. Thyroid Function Tests: No results for input(s): TSH, T4TOTAL, FREET4, T3FREE, THYROIDAB in the last 72 hours. Anemia Panel: No results for input(s): VITAMINB12, FOLATE, FERRITIN, TIBC, IRON, RETICCTPCT in the last 72 hours. Sepsis Labs: No results for input(s): PROCALCITON, LATICACIDVEN in the last 168 hours.  Recent Results (from the past 240 hour(s))  Resp Panel by RT-PCR (Flu A&B, Covid) Nasopharyngeal Swab     Status: None   Collection Time: 05/31/21  8:24 PM   Specimen: Nasopharyngeal Swab; Nasopharyngeal(NP) swabs in vial  transport medium  Result Value Ref Range Status   SARS Coronavirus 2 by RT PCR NEGATIVE NEGATIVE Final    Comment: (NOTE) SARS-CoV-2 target nucleic acids are NOT DETECTED.  The SARS-CoV-2 RNA is generally detectable in upper respiratory specimens during the acute phase of infection. The lowest concentration of SARS-CoV-2 viral copies this assay can detect is 138 copies/mL. A negative result does not preclude SARS-Cov-2 infection and should not be used as the sole basis for treatment or other patient management decisions. A negative result may occur with  improper specimen collection/handling, submission of specimen other than nasopharyngeal swab, presence of viral mutation(s) within the areas targeted by this assay, and inadequate number  of viral copies(<138 copies/mL). A negative result must be combined with clinical observations, patient history, and epidemiological information. The expected result is Negative.  Fact Sheet for Patients:  BloggerCourse.com  Fact Sheet for Healthcare Providers:  SeriousBroker.it  This test is no t yet approved or cleared by the [place] FDA and  has been authorized for detection and/or diagnosis of SARS-CoV-2 by FDA under an Emergency Use Authorization (EUA). This EUA will remain  in effect (meaning this test can be used) for the duration of the COVID-19 declaration under Section 564(b)(1) of the Act, 21 U.S.C.section 360bbb-3(b)(1), unless the authorization is terminated  or revoked sooner.       Influenza A by PCR NEGATIVE NEGATIVE Final   Influenza B by PCR NEGATIVE NEGATIVE Final    Comment: (NOTE) The Xpert Xpress SARS-CoV-2/FLU/RSV plus assay is intended as an aid in the diagnosis of influenza from Nasopharyngeal swab specimens and should not be used as a sole basis for treatment. Nasal washings and aspirates are unacceptable for Xpert Xpress SARS-CoV-2/FLU/RSV testing.  Fact  Sheet for Patients: BloggerCourse.com  Fact Sheet for Healthcare Providers: SeriousBroker.it  This test is not yet approved or cleared by the [place] FDA and has been authorized for detection and/or diagnosis of SARS-CoV-2 by FDA under an Emergency Use Authorization (EUA). This EUA will remain in effect (meaning this test can be used) for the duration of the COVID-19 declaration under Section 564(b)(1) of the Act, 21 U.S.C. section 360bbb-3(b)(1), unless the authorization is terminated or revoked.  Performed at [facility], [address], [place], [place] [address]   Surgical PCR screen     Status: None   Collection Time: 06/01/21  6:31 PM   Specimen: Nasal Mucosa; Nasal Swab  Result Value Ref Range Status   MRSA, PCR NEGATIVE NEGATIVE Final   Staphylococcus aureus NEGATIVE NEGATIVE Final    Comment: (NOTE) The Xpert SA Assay (FDA approved for NASAL specimens in patients 60 years of age and older), is one component of a comprehensive surveillance program. It is not intended to diagnose infection nor to guide or monitor treatment. Performed at [facility], [address], [place], [place] [address]       Radiology Studies: No results found.  Scheduled Meds:  aspirin EC  325 mg Oral Daily   atorvastatin  10 mg Oral Daily   Chlorhexidine Gluconate Cloth  6 each Topical Daily   docusate sodium  100 mg Oral BID   donepezil  5 mg Oral QHS   levothyroxine  100 mcg Oral Once per day on Sun Fri Sat   levothyroxine  50 mcg Oral Once per day on Mon Tue Wed Thu   memantine  5 mg Oral Daily   Pirfenidone  801 mg Oral TID   tamsulosin  0.4 mg Oral Daily   Continuous Infusions:  sodium chloride 75 mL/hr at 06/04/21 1759   lactated ringers Stopped (06/03/21 1950)   methocarbamol (ROBAXIN) IV       LOS: 5 days   Total time spent: 30 minutes  Dorcas Carrow, MD [facility] Pager On Amion  If 7PM-7AM, please contact night-coverage 06/05/2021, 2:34 PM

## 2021-06-05 NOTE — TOC Progression Note (Signed)
Transition of Care [facility]) - Progression Note    Patient Details  Name: Catherine Terrell MRN: [id number] Date of Birth: 1932-11-12  Transition of Care [facility] CM/SW Contact  Amada Jupiter, LCSW Phone Number: 06/05/2021, 3:33 PM  Clinical Narrative:    Spoke with pt's son today to further discuss dc planning concerns.  Son reports he has discussed situation with his brother in [place] in addition to making inquiries with Medicaid of [place] and many other potential resources.  Notes that his brother and sister-in-law are unable to help physically.  He was unable to find any "good news"/ options from agency contacts either.  He remains at a loss of how to manage his mother either in his home or financially (privately paying for SNF or in home care).   In addition to these barriers, pt with medical issues yesterday and had very difficult session with PT today mostly due to language barrier and dementia.  Have spoken with son about trying to be here during the PT session - he is here daily from 6:30p - 8pm  Have alerted PT to check if a later day sessions can be planned for her.  Will attempt to clarify with ortho MD/PA on exact travel limitations post surgery as well.  Continue to follow.   Expected Discharge Plan: Home/Self Care Barriers to Discharge: Financial Resources, Inadequate or no insurance, Continued Medical Work up  Expected Discharge Plan and Services Expected Discharge Plan: Home/Self Care In-house Referral: Clinical Social Work                                             Social Determinants of Health (SDOH) Interventions    Readmission Risk Interventions No flowsheet data found.

## 2021-06-06 ENCOUNTER — Institutional Professional Consult (permissible substitution): Payer: Medicare Other | Admitting: Internal Medicine

## 2021-06-06 DIAGNOSIS — S72002A Fracture of unspecified part of neck of left femur, initial encounter for closed fracture: Secondary | ICD-10-CM | POA: Diagnosis not present

## 2021-06-06 DIAGNOSIS — E039 Hypothyroidism, unspecified: Secondary | ICD-10-CM | POA: Diagnosis not present

## 2021-06-06 MED ORDER — ASPIRIN 81 MG PO CHEW
81.0000 mg | CHEWABLE_TABLET | Freq: Two times a day (BID) | ORAL | Status: DC
  Administered 2021-06-06 – 2021-07-09 (×66): 81 mg via ORAL
  Filled 2021-06-06 (×68): qty 1

## 2021-06-06 NOTE — Progress Notes (Signed)
PROGRESS NOTE    Catherine Terrell  SWF:[id number] DOB: Feb 26, 1933 DOA: 05/31/2021 PCP: System, Provider Not In    Brief Narrative:  87 y.o. female with medical history significant for ILD, hypothyroidism, HLD, cognitive impairment who presents by EMS after fall at home. Pt presented with L hip fracture.  Admitted for surgical stabilization. Patient traveling from [place] to visit her son and fell in the bathroom.  Apparently independent and traveled alone from [place] to [place].  Assessment & Plan:   Principal Problem:   Closed left hip fracture (HCC) Active Problems:   Interstitial lung disease (HCC)   Hypothyroidism   MCI (mild cognitive impairment)  Principal Problem: Closed traumatic left hip fracture: Closed left hip fracture  Status post ORIF with IM nail intertrochanteric left 8/28 Adequate pain medications, mostly controlled with oral Vicodin and Tylenol with Robaxin.  Use laxatives to avoid constipation. Aspirin 80 mg twice daily for DVT prophylaxis. Partial weightbearing left leg.  Continue PT OT.  Difficult disposition with no adequate support at home.   Active Problems:   Interstitial lung disease  -Stable and on minmal O2 support at this time -Seems to be stable at this time -Continued on pirfenidone.     Hypothyroidism -Continue with levothyroxine as tolerated     MCI (mild cognitive impairment) -Pt is on Aricept and Namenda -Presently stable  Acute blood loss anemia Require 2 units of PRBC.  Stabilized.  Bladder outlet obstruction -Recently noted to have evidence of bladder outlet obstruction, requiring serial in and out caths. -Flomax started on 06/03/2021 -Urinating adequately.  DVT prophylaxis: SCD's Code Status: Full Family Communication: Patient's son on the phone 8/31.  Status is: Inpatient  Remains inpatient appropriate because:Inpatient level of care appropriate due to severity of illness  Dispo: The patient is from: Home               Anticipated d/c is to: SNF              Patient currently is not medically stable to d/c.   Difficult to place patient No   Consultants:  Orthopedic Surgery  Procedures:  Open reduction internal fixation with a trochanteric intramedullary nail to proximal left femur  Antimicrobials: Anti-infectives (From admission, onward)    Start     Dose/Rate Route Frequency Ordered Stop   06/02/21 1830  ceFAZolin (ANCEF) IVPB 2g/100 mL premix        2 g 200 mL/hr over 30 Minutes Intravenous Every 6 hours 06/02/21 1454 06/03/21 0117   06/02/21 0830  ceFAZolin (ANCEF) IVPB 2g/100 mL premix        2 g 200 mL/hr over 30 Minutes Intravenous On call to O.R. 06/02/21 0744 06/02/21 1228   06/02/21 0600  ceFAZolin (ANCEF) IVPB 2g/100 mL premix  Status:  Discontinued        2 g 200 mL/hr over 30 Minutes Intravenous On call to O.R. 06/01/21 1333 06/02/21 0748       Subjective: Patient seen and examined.  Today she was more awake.  She took some time to put on her hearing aid and then ultimately started having conversation but totally confused.  She thinks she is at home with her son and we are her relatives. Despite is speaking her language, she was not able to keep up conversation or keep orientation.  She was definitely more awake than yesterday.  Objective: Vitals:   06/05/21 0555 06/05/21 1311 06/05/21 2159 06/06/21 0726  BP: (!) 159/85 (!) 134/100 122/63 (!) 164/79  Pulse: 97 (!) 110 (!) 108 93  Resp:  19 17 16   Temp:  98.2 F (36.8 C) 98.2 F (36.8 C) 97.8 F (36.6 C)  TempSrc:   Axillary Oral  SpO2: 97% (!) 74% 99% 100%  Weight:      Height:        Intake/Output Summary (Last 24 hours) at 06/06/2021 1145 Last data filed at 06/06/2021 1045 Gross per 24 hour  Intake 270 ml  Output --  Net 270 ml   Filed Weights   05/31/21 2309 06/02/21 0233  Weight: 46.8 kg 46.8 kg    Examination:  General: Looks comfortable.  Frail and debilitated.  On room air. Cardiovascular: S1-S2  normal.  No added sounds. Respiratory: Bilateral clear.  No added sounds. Gastrointestinal: Soft and nontender. Ext: No cyanosis or edema.  Left hip lateral thigh incision clean and dry. Neuro: Alert but not oriented.     Data Reviewed: I have personally reviewed following labs and imaging studies  CBC: Recent Labs  Lab 05/31/21 1956 06/01/21 0335 06/03/21 0539 06/04/21 0320 06/04/21 2012 06/05/21 0325  WBC 13.4* 9.9 12.4* 9.1  --  11.5*  NEUTROABS 11.4*  --  9.4*  --   --   --   HGB 10.4* 10.4* 7.4* 5.7* 10.8* 11.6*  HCT 30.9* 30.8* 22.5* 17.5* 32.5* 33.7*  MCV 100.3* 97.8 102.3* 101.7*  --  91.8  PLT 378 355 302 262  --  295   Basic Metabolic Panel: Recent Labs  Lab 05/31/21 1956 06/01/21 0335 06/03/21 0330 06/04/21 0320 06/05/21 0325  NA 128* 130* 129* 131* 133*  K 5.9* 4.1 4.8 4.4 3.8  CL 97* 99 100 103 99  CO2 25 23 23 24 25   GLUCOSE 117* 117* 114* 99 120*  BUN 15 14 28* 24* 15  CREATININE 0.90 0.73 1.02* 0.84 0.50  CALCIUM 8.5* 8.4* 8.0* 7.7* 8.3*   GFR: Estimated Creatinine Clearance: 32.3 mL/min (by C-G formula based on SCr of 0.5 mg/dL). Liver Function Tests: Recent Labs  Lab 06/05/21 0325  AST 107*  ALT 45*  ALKPHOS 45  BILITOT 0.8  PROT 5.9*  ALBUMIN 2.5*   No results for input(s): LIPASE, AMYLASE in the last 168 hours. No results for input(s): AMMONIA in the last 168 hours. Coagulation Profile: No results for input(s): INR, PROTIME in the last 168 hours. Cardiac Enzymes: No results for input(s): CKTOTAL, CKMB, CKMBINDEX, TROPONINI in the last 168 hours. BNP (last 3 results) No results for input(s): PROBNP in the last 8760 hours. HbA1C: No results for input(s): HGBA1C in the last 72 hours. CBG: No results for input(s): GLUCAP in the last 168 hours. Lipid Profile: No results for input(s): CHOL, HDL, LDLCALC, TRIG, CHOLHDL, LDLDIRECT in the last 72 hours. Thyroid Function Tests: No results for input(s): TSH, T4TOTAL, FREET4, T3FREE,  THYROIDAB in the last 72 hours. Anemia Panel: No results for input(s): VITAMINB12, FOLATE, FERRITIN, TIBC, IRON, RETICCTPCT in the last 72 hours. Sepsis Labs: No results for input(s): PROCALCITON, LATICACIDVEN in the last 168 hours.  Recent Results (from the past 240 hour(s))  Resp Panel by RT-PCR (Flu A&B, Covid) Nasopharyngeal Swab     Status: None   Collection Time: 05/31/21  8:24 PM   Specimen: Nasopharyngeal Swab; Nasopharyngeal(NP) swabs in vial transport medium  Result Value Ref Range Status   SARS Coronavirus 2 by RT PCR NEGATIVE NEGATIVE Final    Comment: (NOTE) SARS-CoV-2 target nucleic acids are NOT DETECTED.  The SARS-CoV-2 RNA is generally detectable  in upper respiratory specimens during the acute phase of infection. The lowest concentration of SARS-CoV-2 viral copies this assay can detect is 138 copies/mL. A negative result does not preclude SARS-Cov-2 infection and should not be used as the sole basis for treatment or other patient management decisions. A negative result may occur with  improper specimen collection/handling, submission of specimen other than nasopharyngeal swab, presence of viral mutation(s) within the areas targeted by this assay, and inadequate number of viral copies(<138 copies/mL). A negative result must be combined with clinical observations, patient history, and epidemiological information. The expected result is Negative.  Fact Sheet for Patients:  BloggerCourse.comhttps://www.fda.gov/media/152166/download  Fact Sheet for Healthcare Providers:  SeriousBroker.ithttps://www.fda.gov/media/152162/download  This test is no t yet approved or cleared by the [place] FDA and  has been authorized for detection and/or diagnosis of SARS-CoV-2 by FDA under an Emergency Use Authorization (EUA). This EUA will remain  in effect (meaning this test can be used) for the duration of the COVID-19 declaration under Section 564(b)(1) of the Act, 21 U.S.C.section 360bbb-3(b)(1), unless the  authorization is terminated  or revoked sooner.       Influenza A by PCR NEGATIVE NEGATIVE Final   Influenza B by PCR NEGATIVE NEGATIVE Final    Comment: (NOTE) The Xpert Xpress SARS-CoV-2/FLU/RSV plus assay is intended as an aid in the diagnosis of influenza from Nasopharyngeal swab specimens and should not be used as a sole basis for treatment. Nasal washings and aspirates are unacceptable for Xpert Xpress SARS-CoV-2/FLU/RSV testing.  Fact Sheet for Patients: BloggerCourse.comhttps://www.fda.gov/media/152166/download  Fact Sheet for Healthcare Providers: SeriousBroker.ithttps://www.fda.gov/media/152162/download  This test is not yet approved or cleared by the [place] FDA and has been authorized for detection and/or diagnosis of SARS-CoV-2 by FDA under an Emergency Use Authorization (EUA). This EUA will remain in effect (meaning this test can be used) for the duration of the COVID-19 declaration under Section 564(b)(1) of the Act, 21 U.S.C. section 360bbb-3(b)(1), unless the authorization is terminated or revoked.  Performed at [facility], [address] [place], [place] [address]   Surgical PCR screen     Status: None   Collection Time: 06/01/21  6:31 PM   Specimen: Nasal Mucosa; Nasal Swab  Result Value Ref Range Status   MRSA, PCR NEGATIVE NEGATIVE Final   Staphylococcus aureus NEGATIVE NEGATIVE Final    Comment: (NOTE) The Xpert SA Assay (FDA approved for NASAL specimens in patients 85 years of age and older), is one component of a comprehensive surveillance program. It is not intended to diagnose infection nor to guide or monitor treatment. Performed at [facility], [address] [place], [place] [address]       Radiology Studies: No results found.  Scheduled Meds:  aspirin  81 mg Oral BID   atorvastatin  10 mg Oral Daily   Chlorhexidine Gluconate Cloth  6 each Topical Daily   docusate sodium  100 mg Oral BID   donepezil  5 mg Oral QHS    levothyroxine  100 mcg Oral Once per day on Sun Fri Sat   levothyroxine  50 mcg Oral Once per day on Mon Tue Wed Thu   memantine  5 mg Oral Daily   Pirfenidone  801 mg Oral TID   tamsulosin  0.4 mg Oral Daily   Continuous Infusions:  sodium chloride 75 mL/hr at 06/04/21 1759   lactated ringers Stopped (06/03/21 1950)   methocarbamol (ROBAXIN) IV       LOS: 6 days  Total time spent: 30 minutes  Dorcas Carrow, MD [facility] Pager On Amion  If 7PM-7AM, please contact night-coverage 06/06/2021, 11:45 AM

## 2021-06-06 NOTE — Plan of Care (Signed)
  Problem: Pain Management: Goal: Pain level will decrease Outcome: Progressing   Problem: Health Behavior/Discharge Planning: Goal: Ability to manage health-related needs will improve Outcome: Progressing   Problem: Activity: Goal: Risk for activity intolerance will decrease Outcome: Progressing   Problem: Pain Managment: Goal: General experience of comfort will improve Outcome: Progressing

## 2021-06-06 NOTE — TOC Progression Note (Signed)
Transition of Care [facility]) - Progression Note    Patient Details  Name: Catherine Terrell MRN: [id number] Date of Birth: 03-Oct-1933  [facility]) CM/SW Contact  Amada Jupiter, LCSW Phone Number: 06/06/2021, 3:18 PM  Clinical Narrative:    Spoke again with son today to inform that I was able to discuss travel concerns with Dr. Ranell Patrick PA today and they are OK with flight travel (would plan TED hose and aspirin be given prior) if that is the best plan for pt's care.  Asked that son discuss this with his brother and see if they are able to coordinate pt's return to [place] with possible SNF placement there.  Son to work with PT this evening to see if her participating is better with son present.  Will follow up with all tomorrow.   Expected Discharge Plan: Home/Self Care Barriers to Discharge: Financial Resources, Inadequate or no insurance, Continued Medical Work up  Expected Discharge Plan and Services Expected Discharge Plan: Home/Self Care In-house Referral: Clinical Social Work                                             Social Determinants of Health (SDOH) Interventions    Readmission Risk Interventions No flowsheet data found.

## 2021-06-06 NOTE — Plan of Care (Signed)
  Problem: Coping: Goal: Level of anxiety will decrease Outcome: Progressing   Problem: Pain Managment: Goal: General experience of comfort will improve Outcome: Progressing   

## 2021-06-06 NOTE — Progress Notes (Signed)
Physical Therapy Treatment Patient Details Name: Catherine Terrell MRN: [id number] DOB: [birth year] Today's Date: 06/06/2021    History of Present Illness 87 yo female adm after fall resulting in L IT hip fx. PMH: bil TKAs (done in [place]), dementia    PT Comments    Patient making slow progress, son present this session and improved interaction for bed mobility. Pt took extra time to initiate and continues to require Max Assist for supine>sit transfer. Attempted sit<>stand with assist from pt's son however pt limited by pain and fatigue and no initiation to rise from her. Pt will benefit from SNF placement due to significant assist level needed. Pt was independent PTA. She will benefit from continued skilled PT in acute setting to advance mobility as much as possible.    Follow Up Recommendations  SNF (no ins.)     Equipment Recommendations  Rolling walker with 5" wheels;Wheelchair (measurements PT);Wheelchair cushion (measurements PT);Hospital bed (youth)    Recommendations for Other Services       Precautions / Restrictions Precautions Precautions: Fall Restrictions Weight Bearing Restrictions: Yes LLE Weight Bearing: Partial weight bearing LLE Partial Weight Bearing Percentage or Pounds: 50    Mobility  Bed Mobility Overal bed mobility: Needs Assistance Bed Mobility: Supine to Sit;Sit to Supine     Supine to sit: Max assist;HOB elevated Sit to supine: Max assist;+2 for safety/equipment;+2 for physical assistance   General bed mobility comments: Max Assist with cues to hold bed rail and bring LE's off EOB. No initiation to move Lt LE by pt and minimal initiation to use Rt LE and UE to pull at start but impoved with time. Pt required Max assist to maintain seated balance initially and progressed to Min gaurd/assist. Total Assist to control lowering trunk and raising bil LE's onto bed to return to supine.    Transfers                 General transfer comment: Attempted  to initiate rise form EOB however pt limited by pain and no initiation to rise from pt. pt quickly fatigued as well and required Max +2 assist to return to supine.  Ambulation/Gait                 Stairs             Wheelchair Mobility    Modified Rankin (Stroke Patients Only)       Balance Overall balance assessment: Needs assistance Sitting-balance support: Feet supported Sitting balance-Leahy Scale: Poor Sitting balance - Comments: Pt required Max assist to maintain seated balance initially and progressed to Min gaurd/assist. Pt with posterior and Lt lean. 1 LOB poseriorly.                                    Cognition Arousal/Alertness: Awake/alert Behavior During Therapy: WFL for tasks assessed/performed;Anxious Overall Cognitive Status: History of cognitive impairments - at baseline                                 General Comments: limited due to communication barrier. pt is [facility] and son was present to act as Nurse, learning disability. pt able to speak short phrases in english but mixed words such as "I want to sit down" insteady of lay down when sitting EOB.      Exercises      General Comments  Pertinent Vitals/Pain Pain Assessment: Faces Faces Pain Scale: Hurts even more Pain Location: L hip/thigh Pain Descriptors / Indicators: Grimacing;Moaning Pain Intervention(s): Limited activity within patient's tolerance;Monitored during session;Repositioned;Premedicated before session    Home Living                      Prior Function            PT Goals (current goals can now be found in the care plan section) Acute Rehab PT Goals Patient Stated Goal: unable to state PT Goal Formulation: Patient unable to participate in goal setting Time For Goal Achievement: 06/10/21 Potential to Achieve Goals: Fair Progress towards PT goals: Progressing toward goals    Frequency    Min 3X/week      PT Plan Current plan  remains appropriate    Co-evaluation              AM-PAC PT "6 Clicks" Mobility   Outcome Measure  Help needed turning from your back to your side while in a flat bed without using bedrails?: Total Help needed moving from lying on your back to sitting on the side of a flat bed without using bedrails?: Total Help needed moving to and from a bed to a chair (including a wheelchair)?: Total Help needed standing up from a chair using your arms (e.g., wheelchair or bedside chair)?: Total Help needed to walk in hospital room?: Total Help needed climbing 3-5 steps with a railing? : Total 6 Click Score: 6    End of Session Equipment Utilized During Treatment: Gait belt Activity Tolerance: Patient limited by pain;Patient limited by fatigue Patient left: in bed;with call bell/phone within reach;with bed alarm set;with family/visitor present Nurse Communication: Mobility status PT Visit Diagnosis: Other abnormalities of gait and mobility (R26.89)     Time: 1093-2355 PT Time Calculation (min) (ACUTE ONLY): 33 min  Charges:  $Therapeutic Activity: 23-37 mins                     Wynn Maudlin, DPT Acute Rehabilitation Services Office [phone] Pager [phone]    Catherine Terrell 06/06/2021, 7:26 PM

## 2021-06-07 DIAGNOSIS — E039 Hypothyroidism, unspecified: Secondary | ICD-10-CM | POA: Diagnosis not present

## 2021-06-07 DIAGNOSIS — S72002A Fracture of unspecified part of neck of left femur, initial encounter for closed fracture: Secondary | ICD-10-CM | POA: Diagnosis not present

## 2021-06-07 MED ORDER — SENNOSIDES-DOCUSATE SODIUM 8.6-50 MG PO TABS
1.0000 | ORAL_TABLET | Freq: Every day | ORAL | Status: DC
  Administered 2021-06-08 – 2021-06-09 (×2): 1 via ORAL
  Filled 2021-06-07 (×2): qty 1

## 2021-06-07 MED ORDER — POLYETHYLENE GLYCOL 3350 17 G PO PACK
17.0000 g | PACK | Freq: Every day | ORAL | Status: DC
  Administered 2021-06-08 – 2021-06-10 (×3): 17 g via ORAL
  Filled 2021-06-07 (×3): qty 1

## 2021-06-07 NOTE — Plan of Care (Signed)
  Problem: Pain Management: Goal: Pain level will decrease 06/07/2021 0154 by Virgilio Belling, RN Outcome: Progressing 06/07/2021 0153 by Virgilio Belling, RN Outcome: Progressing   Problem: Coping: Goal: Level of anxiety will decrease 06/07/2021 0154 by Virgilio Belling, RN Outcome: Progressing 06/07/2021 0153 by Virgilio Belling, RN Outcome: Progressing   Problem: Safety: Goal: Ability to remain free from injury will improve 06/07/2021 0154 by Virgilio Belling, RN Outcome: Progressing 06/07/2021 0153 by Virgilio Belling, RN Outcome: Progressing   Problem: Skin Integrity: Goal: Risk for impaired skin integrity will decrease Outcome: Progressing

## 2021-06-07 NOTE — Plan of Care (Signed)
  Problem: Activity: Goal: Ability to ambulate and perform ADLs will improve Outcome: Progressing   Problem: Pain Management: Goal: Pain level will decrease Outcome: Progressing   

## 2021-06-07 NOTE — Plan of Care (Signed)
  Problem: Education: Goal: Verbalization of understanding the information provided (i.e., activity precautions, restrictions, etc) will improve Outcome: Progressing   Problem: Clinical Measurements: Goal: Postoperative complications will be avoided or minimized Outcome: Progressing   Problem: Pain Management: Goal: Pain level will decrease Outcome: Progressing   Problem: Coping: Goal: Level of anxiety will decrease Outcome: Progressing   Problem: Safety: Goal: Ability to remain free from injury will improve Outcome: Progressing

## 2021-06-07 NOTE — Progress Notes (Signed)
Physical Therapy Treatment Patient Details Name: Catherine Terrell MRN: [id number] DOB: Aug 22, 1933 Today's Date: 06/07/2021    History of Present Illness 87 yo female adm after fall resulting in L IT hip fx. PMH: bil TKAs (done in [place]), dementia    PT Comments    Pt making some progress today. Communication remains difficult d/t HOH and language barrier. Will continue to work with pt as much as possible. Pt son will be here (hopefully) over the wknd. Pt is not repsonsive to iPad translator   Follow Up Recommendations  Other (comment);SNF (no ins)     Equipment Recommendations  Rolling walker with 5" wheels;Wheelchair (measurements PT);Wheelchair cushion (measurements PT);Hospital bed;3in1 (PT)    Recommendations for Other Services       Precautions / Restrictions Restrictions Weight Bearing Restrictions: Yes LLE Weight Bearing: Partial weight bearing LLE Partial Weight Bearing Percentage or Pounds: 50%    Mobility  Bed Mobility Overal bed mobility: Needs Assistance Bed Mobility: Supine to Sit     Supine to sit: Mod assist;Max assist;+2 for physical assistance;+2 for safety/equipment     General bed mobility comments: assist with LEs and trunk, visual and tactile cues pt able to participate more today, less indications of severe pain with hip flexion/movement    Transfers Overall transfer level: Needs assistance Equipment used: Ambulation equipment used Transfers: Sit to/from Stand Sit to Stand: Max assist;+2 physical assistance;+2 safety/equipment;From elevated surface         General transfer comment: 3 attempts to stand, incr pt effort on last attempt. visual cues to initiate movement. pt able to assist with anterior-superior wt shift after LLE repositioned and WB for transition to chair using Stedy  Ambulation/Gait                 Stairs             Wheelchair Mobility    Modified Rankin (Stroke Patients Only)       Balance Overall balance  assessment: Needs assistance Sitting-balance support: Feet supported Sitting balance-Leahy Scale: Poor Sitting balance - Comments: assist to maintain sitting, posterior LOB, progressed from max assist to min assist with vuisual cues to remian upright     Standing balance-Leahy Scale: Poor                              Cognition Arousal/Alertness: Awake/alert Behavior During Therapy: WFL for tasks assessed/performed;Anxious Overall Cognitive Status: History of cognitive impairments - at baseline                                 General Comments: limited due to communication barrier. attempted to use ipad translator, pt has dificulty hearing interpreter, speaks English at times. states "I understand some"      Exercises General Exercises - Lower Extremity Heel Slides: AAROM;Left;5 reps    General Comments        Pertinent Vitals/Pain Pain Assessment: Faces Faces Pain Scale: Hurts little more Pain Location: L hip/thigh Pain Descriptors / Indicators: Grimacing;Moaning Pain Intervention(s): Limited activity within patient's tolerance;Monitored during session;Premedicated before session;Repositioned    Home Living                      Prior Function            PT Goals (current goals can now be found in the care plan section) Acute Rehab  PT Goals Patient Stated Goal: unable to state PT Goal Formulation: Patient unable to participate in goal setting Time For Goal Achievement: 06/10/21 Potential to Achieve Goals: Fair Progress towards PT goals: Progressing toward goals    Frequency    Min 3X/week      PT Plan Current plan remains appropriate    Co-evaluation              AM-PAC PT "6 Clicks" Mobility   Outcome Measure  Help needed turning from your back to your side while in a flat bed without using bedrails?: Total Help needed moving from lying on your back to sitting on the side of a flat bed without using bedrails?:  Total Help needed moving to and from a bed to a chair (including a wheelchair)?: Total Help needed standing up from a chair using your arms (e.g., wheelchair or bedside chair)?: Total Help needed to walk in hospital room?: Total Help needed climbing 3-5 steps with a railing? : Total 6 Click Score: 6    End of Session Equipment Utilized During Treatment: Gait belt Activity Tolerance: Patient limited by pain;Patient limited by fatigue Patient left: in chair;with call bell/phone within reach;with chair alarm set Nurse Communication: Mobility status PT Visit Diagnosis: Other abnormalities of gait and mobility (R26.89)     Time: 2774-1287 PT Time Calculation (min) (ACUTE ONLY): 28 min  Charges:  $Therapeutic Activity: 23-37 mins                     Delice Bison, PT  Acute Rehab Dept ([facility]) [phone] Pager [phone]  06/07/2021    [facility] 06/07/2021, 12:00 PM

## 2021-06-07 NOTE — Progress Notes (Signed)
PROGRESS NOTE    Catherine Terrell  KNL:[id number] DOB: 27-Oct-1932 DOA: 05/31/2021 PCP: System, Provider Not In    Brief Narrative:  87 y.o. female with medical history significant for ILD, hypothyroidism, HLD, cognitive impairment who presented to ER via EMS after fall at home. Pt presented with L hip fracture.  Admitted for surgical stabilization. Patient traveling from [place] to visit her son and fell in the bathroom.  Apparently independent and traveled alone from [place] to [place].  Assessment & Plan:   Principal Problem:   Closed left hip fracture (HCC) Active Problems:   Interstitial lung disease (HCC)   Hypothyroidism   MCI (mild cognitive impairment)  Principal Problem: Closed traumatic left hip fracture: Closed left hip fracture  Status post ORIF with IM nail intertrochanteric left 8/28 Adequate pain medications, mostly controlled with oral Vicodin and Tylenol with Robaxin.  Use laxatives to avoid constipation. Aspirin 80 mg twice daily for DVT prophylaxis. Partial weightbearing left leg.  Continue PT OT.  Difficult disposition with no adequate support at home.   Active Problems:   Interstitial lung disease  -Stable on room air.  -Seems to be stable at this time -Continued on pirfenidone.     Hypothyroidism -Continue with levothyroxine as tolerated   Acute metabolic encephalopathy in a patient with underlying MCI (mild cognitive impairment) -Pt is on Aricept and Namenda -Presently stable.  -Patient developed significant lethargy and disorientation probably related to anesthesia and sedation.  Also with language barrier and hard of hearing. -No focal deficits.  Acute blood loss anemia Required 2 units of PRBC.  Stabilized.  Bladder outlet obstruction -Recently noted to have evidence of bladder outlet obstruction, requiring serial in and out caths. -Flomax started on 06/03/2021 -Urinating adequately.  DVT prophylaxis: SCD's Code Status: Full Family  Communication: None.  Status is: Inpatient  Remains inpatient appropriate because:Inpatient level of care appropriate due to severity of illness  Dispo: The patient is from: Home              Anticipated d/c is to: SNF              Patient currently is not medically stable to d/c.   Difficult to place patient yes.  Patient does not have insurance coverage for [facility], does not have safe disposition home.   Consultants:  Orthopedic Surgery  Procedures:  Open reduction internal fixation with a trochanteric intramedullary nail to proximal left femur  Antimicrobials: Anti-infectives (From admission, onward)    Start     Dose/Rate Route Frequency Ordered Stop   06/02/21 1830  ceFAZolin (ANCEF) IVPB 2g/100 mL premix        2 g 200 mL/hr over 30 Minutes Intravenous Every 6 hours 06/02/21 1454 06/03/21 0117   06/02/21 0830  ceFAZolin (ANCEF) IVPB 2g/100 mL premix        2 g 200 mL/hr over 30 Minutes Intravenous On call to O.R. 06/02/21 0744 06/02/21 1228   06/02/21 0600  ceFAZolin (ANCEF) IVPB 2g/100 mL premix  Status:  Discontinued        2 g 200 mL/hr over 30 Minutes Intravenous On call to O.R. 06/01/21 1333 06/02/21 0748       Subjective: Patient seen and examined.  Mostly sleepy.  When she wakes up, she tries to keep up some communication however mostly difficult to keep up communication even with [place] language.  Patient disoriented.  Objective: Vitals:   06/06/21 0726 06/06/21 2308 06/07/21 0711 06/07/21 1319  BP: (!) 164/79 137/72 Marland Kitchen)  154/78 130/85  Pulse: 93 100 100 (!) 103  Resp: 16 16 16 18   Temp: 97.8 F (36.6 C) 98.3 F (36.8 C) 98 F (36.7 C) 98.3 F (36.8 C)  TempSrc: Oral   Oral  SpO2: 100% 98% 99% 100%  Weight:      Height:        Intake/Output Summary (Last 24 hours) at 06/07/2021 1417 Last data filed at 06/07/2021 0600 Gross per 24 hour  Intake 420 ml  Output 100 ml  Net 320 ml   Filed Weights   05/31/21 2309 06/02/21 0233  Weight: 46.8 kg  46.8 kg    Examination:  General: Looks comfortable.  Frail and debilitated.  On room air.  Sleepy and lethargic. Cardiovascular: S1-S2 normal.  No added sounds. Respiratory: Bilateral clear.  No added sounds. Gastrointestinal: Soft and nontender. Ext: No cyanosis or edema.  Left hip lateral thigh incision clean and dry. Neuro: Alert but not oriented.     Data Reviewed: I have personally reviewed following labs and imaging studies  CBC: Recent Labs  Lab 05/31/21 1956 06/01/21 0335 06/03/21 0539 06/04/21 0320 06/04/21 2012 06/05/21 0325  WBC 13.4* 9.9 12.4* 9.1  --  11.5*  NEUTROABS 11.4*  --  9.4*  --   --   --   HGB 10.4* 10.4* 7.4* 5.7* 10.8* 11.6*  HCT 30.9* 30.8* 22.5* 17.5* 32.5* 33.7*  MCV 100.3* 97.8 102.3* 101.7*  --  91.8  PLT 378 355 302 262  --  295   Basic Metabolic Panel: Recent Labs  Lab 05/31/21 1956 06/01/21 0335 06/03/21 0330 06/04/21 0320 06/05/21 0325  NA 128* 130* 129* 131* 133*  K 5.9* 4.1 4.8 4.4 3.8  CL 97* 99 100 103 99  CO2 25 23 23 24 25   GLUCOSE 117* 117* 114* 99 120*  BUN 15 14 28* 24* 15  CREATININE 0.90 0.73 1.02* 0.84 0.50  CALCIUM 8.5* 8.4* 8.0* 7.7* 8.3*   GFR: Estimated Creatinine Clearance: 32.3 mL/min (by C-G formula based on SCr of 0.5 mg/dL). Liver Function Tests: Recent Labs  Lab 06/05/21 0325  AST 107*  ALT 45*  ALKPHOS 45  BILITOT 0.8  PROT 5.9*  ALBUMIN 2.5*   No results for input(s): LIPASE, AMYLASE in the last 168 hours. No results for input(s): AMMONIA in the last 168 hours. Coagulation Profile: No results for input(s): INR, PROTIME in the last 168 hours. Cardiac Enzymes: No results for input(s): CKTOTAL, CKMB, CKMBINDEX, TROPONINI in the last 168 hours. BNP (last 3 results) No results for input(s): PROBNP in the last 8760 hours. HbA1C: No results for input(s): HGBA1C in the last 72 hours. CBG: No results for input(s): GLUCAP in the last 168 hours. Lipid Profile: No results for input(s): CHOL,  HDL, LDLCALC, TRIG, CHOLHDL, LDLDIRECT in the last 72 hours. Thyroid Function Tests: No results for input(s): TSH, T4TOTAL, FREET4, T3FREE, THYROIDAB in the last 72 hours. Anemia Panel: No results for input(s): VITAMINB12, FOLATE, FERRITIN, TIBC, IRON, RETICCTPCT in the last 72 hours. Sepsis Labs: No results for input(s): PROCALCITON, LATICACIDVEN in the last 168 hours.  Recent Results (from the past 240 hour(s))  Resp Panel by RT-PCR (Flu A&B, Covid) Nasopharyngeal Swab     Status: None   Collection Time: 05/31/21  8:24 PM   Specimen: Nasopharyngeal Swab; Nasopharyngeal(NP) swabs in vial transport medium  Result Value Ref Range Status   SARS Coronavirus 2 by RT PCR NEGATIVE NEGATIVE Final    Comment: (NOTE) SARS-CoV-2 target nucleic acids are  NOT DETECTED.  The SARS-CoV-2 RNA is generally detectable in upper respiratory specimens during the acute phase of infection. The lowest concentration of SARS-CoV-2 viral copies this assay can detect is 138 copies/mL. A negative result does not preclude SARS-Cov-2 infection and should not be used as the sole basis for treatment or other patient management decisions. A negative result may occur with  improper specimen collection/handling, submission of specimen other than nasopharyngeal swab, presence of viral mutation(s) within the areas targeted by this assay, and inadequate number of viral copies(<138 copies/mL). A negative result must be combined with clinical observations, patient history, and epidemiological information. The expected result is Negative.  Fact Sheet for Patients:  BloggerCourse.comhttps://www.fda.gov/media/152166/download  Fact Sheet for Healthcare Providers:  SeriousBroker.ithttps://www.fda.gov/media/152162/download  This test is no t yet approved or cleared by the [place] FDA and  has been authorized for detection and/or diagnosis of SARS-CoV-2 by FDA under an Emergency Use Authorization (EUA). This EUA will remain  in effect (meaning this  test can be used) for the duration of the COVID-19 declaration under Section 564(b)(1) of the Act, 21 U.S.C.section 360bbb-3(b)(1), unless the authorization is terminated  or revoked sooner.       Influenza A by PCR NEGATIVE NEGATIVE Final   Influenza B by PCR NEGATIVE NEGATIVE Final    Comment: (NOTE) The Xpert Xpress SARS-CoV-2/FLU/RSV plus assay is intended as an aid in the diagnosis of influenza from Nasopharyngeal swab specimens and should not be used as a sole basis for treatment. Nasal washings and aspirates are unacceptable for Xpert Xpress SARS-CoV-2/FLU/RSV testing.  Fact Sheet for Patients: BloggerCourse.comhttps://www.fda.gov/media/152166/download  Fact Sheet for Healthcare Providers: SeriousBroker.ithttps://www.fda.gov/media/152162/download  This test is not yet approved or cleared by the [place] FDA and has been authorized for detection and/or diagnosis of SARS-CoV-2 by FDA under an Emergency Use Authorization (EUA). This EUA will remain in effect (meaning this test can be used) for the duration of the COVID-19 declaration under Section 564(b)(1) of the Act, 21 U.S.C. section 360bbb-3(b)(1), unless the authorization is terminated or revoked.  Performed at [facility], [address] [place], [place] [address]   Surgical PCR screen     Status: None   Collection Time: 06/01/21  6:31 PM   Specimen: Nasal Mucosa; Nasal Swab  Result Value Ref Range Status   MRSA, PCR NEGATIVE NEGATIVE Final   Staphylococcus aureus NEGATIVE NEGATIVE Final    Comment: (NOTE) The Xpert SA Assay (FDA approved for NASAL specimens in patients 85 years of age and older), is one component of a comprehensive surveillance program. It is not intended to diagnose infection nor to guide or monitor treatment. Performed at [facility], [address] [place], [place] [address]       Radiology Studies: No results found.  Scheduled Meds:  aspirin  81 mg Oral BID    atorvastatin  10 mg Oral Daily   Chlorhexidine Gluconate Cloth  6 each Topical Daily   docusate sodium  100 mg Oral BID   donepezil  5 mg Oral QHS   levothyroxine  100 mcg Oral Once per day on Sun Fri Sat   levothyroxine  50 mcg Oral Once per day on Mon Tue Wed Thu   memantine  5 mg Oral Daily   Pirfenidone  801 mg Oral TID   polyethylene glycol  17 g Oral Daily   [START ON 06/08/2021] senna-docusate  1 tablet Oral QHS   tamsulosin  0.4 mg Oral Daily   Continuous Infusions:  sodium chloride 75 mL/hr at 06/04/21 1759   methocarbamol (ROBAXIN) IV       LOS: 7 days   Total time spent: 30 minutes  Dorcas Carrow, MD [facility] Pager On Amion  If 7PM-7AM, please contact night-coverage 06/07/2021, 2:17 PM

## 2021-06-08 DIAGNOSIS — E039 Hypothyroidism, unspecified: Secondary | ICD-10-CM | POA: Diagnosis not present

## 2021-06-08 DIAGNOSIS — S72002A Fracture of unspecified part of neck of left femur, initial encounter for closed fracture: Secondary | ICD-10-CM | POA: Diagnosis not present

## 2021-06-08 MED ORDER — BISACODYL 10 MG RE SUPP
10.0000 mg | Freq: Once | RECTAL | Status: AC
  Administered 2021-06-08: 10 mg via RECTAL
  Filled 2021-06-08: qty 1

## 2021-06-08 NOTE — Plan of Care (Signed)
  Problem: Pain Management: Goal: Pain level will decrease Outcome: Progressing   

## 2021-06-08 NOTE — Plan of Care (Signed)
  Problem: Education: Goal: Verbalization of understanding the information provided (i.e., activity precautions, restrictions, etc) will improve Outcome: Not Progressing   Problem: Activity: Goal: Ability to ambulate and perform ADLs will improve Outcome: Not Progressing   Problem: Pain Management: Goal: Pain level will decrease Outcome: Not Progressing

## 2021-06-08 NOTE — Progress Notes (Signed)
PROGRESS NOTE    Catherine Terrell  ZOX:[id number] DOB: 1932/12/01 DOA: 05/31/2021 PCP: System, Provider Not In    Brief Narrative:  87 y.o. female with medical history significant for ILD, hypothyroidism, HLD, cognitive impairment who presented to ER via EMS after fall at home. Pt presented with L hip fracture.  Admitted for surgical stabilization. Patient traveling from [place] to visit her son and fell in the bathroom.  Apparently independent and traveled alone from [place] to [place].  Postop course complicated with development of encephalopathy, persistent confusion and delirium that is improving now.  Assessment & Plan:   Principal Problem:   Closed left hip fracture (HCC) Active Problems:   Interstitial lung disease (HCC)   Hypothyroidism   MCI (mild cognitive impairment)  Principal Problem: Closed traumatic left hip fracture:  Status post ORIF with IM nail intertrochanteric left 8/28 Adequate pain medications, mostly controlled with oral Vicodin and Tylenol with Robaxin.  Use laxatives to avoid constipation. Aspirin 80 mg twice daily for DVT prophylaxis. Partial weightbearing left leg.  Continue PT OT.  Difficult disposition with no adequate support at home.   Active Problems:   Interstitial lung disease  -Stable on room air.  -Seems to be stable at this time -Continued on pirfenidone.     Hypothyroidism -Continue with levothyroxine as tolerated   Acute metabolic encephalopathy in a patient with underlying MCI (mild cognitive impairment) -Pt is on Aricept and Namenda -Presently stable.  Mental status improving. -Patient developed significant lethargy and disorientation probably related to anesthesia and sedation.  Also with language barrier and hard of hearing. -No focal deficits.  Acute blood loss anemia Required 2 units of PRBC.  Stabilized.  Bladder outlet obstruction -Recently noted to have evidence of bladder outlet obstruction, requiring serial in  and out caths. -Flomax started on 06/03/2021 -Urinating adequately.  DVT prophylaxis: SCD's Code Status: Full Family Communication: Son on the phone.  Status is: Inpatient  Remains inpatient appropriate because:Inpatient level of care appropriate due to severity of illness  Dispo: The patient is from: Home              Anticipated d/c is to: SNF              Patient currently is not medically stable to d/c.   Difficult to place patient yes.  [facility] , does not have safe disposition home.   Consultants:  Orthopedic Surgery  Procedures:  Open reduction internal fixation with a trochanteric intramedullary nail to proximal left femur  Antimicrobials: Anti-infectives (From admission, onward)    Start     Dose/Rate Route Frequency Ordered Stop   06/02/21 1830  ceFAZolin (ANCEF) IVPB 2g/100 mL premix        2 g 200 mL/hr over 30 Minutes Intravenous Every 6 hours 06/02/21 1454 06/03/21 0117   06/02/21 0830  ceFAZolin (ANCEF) IVPB 2g/100 mL premix        2 g 200 mL/hr over 30 Minutes Intravenous On call to O.R. 06/02/21 0744 06/02/21 1228   06/02/21 0600  ceFAZolin (ANCEF) IVPB 2g/100 mL premix  Status:  Discontinued        2 g 200 mL/hr over 30 Minutes Intravenous On call to O.R. 06/01/21 1333 06/02/21 0748       Subjective: Patient seen and examined today.  Today, she was able to keep up minimal conversation when spoken in [place] language.  She was very pleasant and able to keep up with some conversation.  She tells me that she is from [place].  She is worried about not having bowel movement.  She still does not know why she is in the hospital, however she is able to be oriented.  Patient tells me she is afraid to walk as she fell in the bathroom.  Called and discussed with patient's son.  I encouraged him to bring food from home as well as stay with his mom so that she can work better with physical  therapy.  Objective: Vitals:   06/07/21 0711 06/07/21 1319 06/07/21 2040 06/08/21 0524  BP: (!) 154/78 130/85 116/64 (!) 170/92  Pulse: 100 (!) 103 (!) 110 (!) 110  Resp: 16 18 18 20   Temp: 98 F (36.7 C) 98.3 F (36.8 C) 98.2 F (36.8 C) 98.5 F (36.9 C)  TempSrc:  Oral Axillary Axillary  SpO2: 99% 100% 98% 98%  Weight:      Height:        Intake/Output Summary (Last 24 hours) at 06/08/2021 1131 Last data filed at 06/08/2021 1100 Gross per 24 hour  Intake 1582.35 ml  Output 500 ml  Net 1082.35 ml   Filed Weights   05/31/21 2309 06/02/21 0233  Weight: 46.8 kg 46.8 kg    Examination:  General: Looks comfortable.  Frail and debilitated.  On room air.   She is alert and awake but not oriented. Cardiovascular: S1-S2 normal.  No added sounds. Respiratory: Bilateral clear.  No added sounds. Gastrointestinal: Soft and nontender. Ext: No cyanosis or edema.  Left hip lateral thigh incision clean and dry. Neuro: Alert but not oriented.  No focal deficits.     Data Reviewed: I have personally reviewed following labs and imaging studies  CBC: Recent Labs  Lab 06/03/21 0539 06/04/21 0320 06/04/21 2012 06/05/21 0325  WBC 12.4* 9.1  --  11.5*  NEUTROABS 9.4*  --   --   --   HGB 7.4* 5.7* 10.8* 11.6*  HCT 22.5* 17.5* 32.5* 33.7*  MCV 102.3* 101.7*  --  91.8  PLT 302 262  --  295   Basic Metabolic Panel: Recent Labs  Lab 06/03/21 0330 06/04/21 0320 06/05/21 0325  NA 129* 131* 133*  K 4.8 4.4 3.8  CL 100 103 99  CO2 23 24 25   GLUCOSE 114* 99 120*  BUN 28* 24* 15  CREATININE 1.02* 0.84 0.50  CALCIUM 8.0* 7.7* 8.3*   GFR: Estimated Creatinine Clearance: 32.3 mL/min (by C-G formula based on SCr of 0.5 mg/dL). Liver Function Tests: Recent Labs  Lab 06/05/21 0325  AST 107*  ALT 45*  ALKPHOS 45  BILITOT 0.8  PROT 5.9*  ALBUMIN 2.5*   No results for input(s): LIPASE, AMYLASE in the last 168 hours. No results for input(s): AMMONIA in the last 168  hours. Coagulation Profile: No results for input(s): INR, PROTIME in the last 168 hours. Cardiac Enzymes: No results for input(s): CKTOTAL, CKMB, CKMBINDEX, TROPONINI in the last 168 hours. BNP (last 3 results) No results for input(s): PROBNP in the last 8760 hours. HbA1C: No results for input(s): HGBA1C in the last 72 hours. CBG: No results for input(s): GLUCAP in the last 168 hours. Lipid Profile: No results for input(s): CHOL, HDL, LDLCALC, TRIG, CHOLHDL, LDLDIRECT in the last 72 hours. Thyroid Function Tests: No results for input(s): TSH, T4TOTAL, FREET4, T3FREE, THYROIDAB in the last 72 hours. Anemia Panel: No results for input(s): VITAMINB12, FOLATE, FERRITIN, TIBC, IRON, RETICCTPCT in the last 72 hours. Sepsis Labs: No results for input(s):  PROCALCITON, LATICACIDVEN in the last 168 hours.  Recent Results (from the past 240 hour(s))  Resp Panel by RT-PCR (Flu A&B, Covid) Nasopharyngeal Swab     Status: None   Collection Time: 05/31/21  8:24 PM   Specimen: Nasopharyngeal Swab; Nasopharyngeal(NP) swabs in vial transport medium  Result Value Ref Range Status   SARS Coronavirus 2 by RT PCR NEGATIVE NEGATIVE Final    Comment: (NOTE) SARS-CoV-2 target nucleic acids are NOT DETECTED.  The SARS-CoV-2 RNA is generally detectable in upper respiratory specimens during the acute phase of infection. The lowest concentration of SARS-CoV-2 viral copies this assay can detect is 138 copies/mL. A negative result does not preclude SARS-Cov-2 infection and should not be used as the sole basis for treatment or other patient management decisions. A negative result may occur with  improper specimen collection/handling, submission of specimen other than nasopharyngeal swab, presence of viral mutation(s) within the areas targeted by this assay, and inadequate number of viral copies(<138 copies/mL). A negative result must be combined with clinical observations, patient history, and  epidemiological information. The expected result is Negative.  Fact Sheet for Patients:  BloggerCourse.com  Fact Sheet for Healthcare Providers:  SeriousBroker.it  This test is no t yet approved or cleared by the [place] FDA and  has been authorized for detection and/or diagnosis of SARS-CoV-2 by FDA under an Emergency Use Authorization (EUA). This EUA will remain  in effect (meaning this test can be used) for the duration of the COVID-19 declaration under Section 564(b)(1) of the Act, 21 U.S.C.section 360bbb-3(b)(1), unless the authorization is terminated  or revoked sooner.       Influenza A by PCR NEGATIVE NEGATIVE Final   Influenza B by PCR NEGATIVE NEGATIVE Final    Comment: (NOTE) The Xpert Xpress SARS-CoV-2/FLU/RSV plus assay is intended as an aid in the diagnosis of influenza from Nasopharyngeal swab specimens and should not be used as a sole basis for treatment. Nasal washings and aspirates are unacceptable for Xpert Xpress SARS-CoV-2/FLU/RSV testing.  Fact Sheet for Patients: BloggerCourse.com  Fact Sheet for Healthcare Providers: SeriousBroker.it  This test is not yet approved or cleared by the [place] FDA and has been authorized for detection and/or diagnosis of SARS-CoV-2 by FDA under an Emergency Use Authorization (EUA). This EUA will remain in effect (meaning this test can be used) for the duration of the COVID-19 declaration under Section 564(b)(1) of the Act, 21 U.S.C. section 360bbb-3(b)(1), unless the authorization is terminated or revoked.  Performed at [facility], [address], [place], [place] [address]   Surgical PCR screen     Status: None   Collection Time: 06/01/21  6:31 PM   Specimen: Nasal Mucosa; Nasal Swab  Result Value Ref Range Status   MRSA, PCR NEGATIVE NEGATIVE Final   Staphylococcus aureus NEGATIVE  NEGATIVE Final    Comment: (NOTE) The Xpert SA Assay (FDA approved for NASAL specimens in patients 47 years of age and older), is one component of a comprehensive surveillance program. It is not intended to diagnose infection nor to guide or monitor treatment. Performed at [facility], [address] [place], [place] [address]       Radiology Studies: No results found.  Scheduled Meds:  aspirin  81 mg Oral BID   atorvastatin  10 mg Oral Daily   Chlorhexidine Gluconate Cloth  6 each Topical Daily   docusate sodium  100 mg Oral BID   donepezil  5 mg Oral QHS  levothyroxine  100 mcg Oral Once per day on Sun Fri Sat   levothyroxine  50 mcg Oral Once per day on Mon Tue Wed Thu   memantine  5 mg Oral Daily   Pirfenidone  801 mg Oral TID   polyethylene glycol  17 g Oral Daily   senna-docusate  1 tablet Oral QHS   tamsulosin  0.4 mg Oral Daily   Continuous Infusions:  methocarbamol (ROBAXIN) IV       LOS: 8 days   Total time spent: 30 minutes  Dorcas Carrow, MD [facility] Pager On Amion  If 7PM-7AM, please contact night-coverage 06/08/2021, 11:31 AM

## 2021-06-08 NOTE — Progress Notes (Signed)
Physical Therapy Treatment Patient Details Name: Catherine Terrell MRN: [id number] DOB: 1933-05-10 Today's Date: 06/08/2021    History of Present Illness 87 yo female adm after fall resulting in L IT hip fx. PMH: bil TKAs (done in [place]), dementia    PT Comments    Incr pt participation and activity tolerance this afternoon, son present for session. Pt able to transfer performing repeated sit<>stands, bed to [facility], BSC to chair, however still requiring +2 assist.  Pt still fearful of falling and reluctant to PWB LLE. Will continue to follow and  work with pt (with son present) as much as possible.  Follow Up Recommendations  Other (comment);SNF (no ins)     Equipment Recommendations  Rolling walker with 5" wheels;Wheelchair (measurements PT);Wheelchair cushion (measurements PT);Hospital bed;3in1 (PT)    Recommendations for Other Services       Precautions / Restrictions Precautions Precautions: Fall Restrictions Weight Bearing Restrictions: No LLE Weight Bearing: Weight bearing as tolerated    Mobility  Bed Mobility Overal bed mobility: Needs Assistance Bed Mobility: Supine to Sit     Supine to sit: Mod assist;+2 for physical assistance;+2 for safety/equipment     General bed mobility comments: assist with LEs and trunk, visual and tactile cues pt able to participate more today, less indications of severe pain with hip flexion/movement    Transfers Overall transfer level: Needs assistance   Transfers: Sit to/from Stand;Stand Pivot Transfers Sit to Stand: Max assist;+2 physical assistance;+2 safety/equipment;Mod assist Stand pivot transfers: Mod assist;+2 physical assistance;+2 safety/equipment       General transfer comment: multi-modal cues and son translating. pt able to assist with anterior-superior wt shift after LLE repositioned in neutral and knee flexion; sit<>stand x4, stand pivot with +2 assist, PT and tech blocking knees to avoid knee buckling and incr trunk/hip  extension. improved WBing LLE with transfers  Ambulation/Gait             General Gait Details: pivotal steps only   Optometrist    Modified Rankin (Stroke Patients Only)       Balance Overall balance assessment: Needs assistance Sitting-balance support: Feet supported Sitting balance-Leahy Scale: Fair Sitting balance - Comments: initially required mod assist, progressed to close supervision with incr time     Standing balance-Leahy Scale: Zero Standing balance comment: zero to poor, pt heavily reliant on UEs and external assist                            Cognition Arousal/Alertness: Awake/alert Behavior During Therapy: [facility] for tasks assessed/performed;Anxious Overall Cognitive Status: History of cognitive impairments - at baseline                                 General Comments: limited due to communication barrier. attempted to use ipad translator, pt has dificulty hearing interpreter, speaks English at times. states "I understand some"      Exercises General Exercises - Lower Extremity Ankle Circles/Pumps: AROM;Both;10 reps Heel Slides: AAROM;Left;5 reps    General Comments        Pertinent Vitals/Pain Pain Assessment: 0-10 Faces Pain Scale: Hurts little more Pain Location: L hip/thigh Pain Descriptors / Indicators: Grimacing;Moaning    Home Living  Prior Function            PT Goals (current goals can now be found in the care plan section) Acute Rehab PT Goals Patient Stated Goal: unable to state PT Goal Formulation: Patient unable to participate in goal setting Time For Goal Achievement: 06/10/21 Potential to Achieve Goals: Fair    Frequency    Min 3X/week      PT Plan Current plan remains appropriate    Co-evaluation              AM-PAC PT "6 Clicks" Mobility   Outcome Measure  Help needed turning from your back to your side while in a  flat bed without using bedrails?: Total Help needed moving from lying on your back to sitting on the side of a flat bed without using bedrails?: Total Help needed moving to and from a bed to a chair (including a wheelchair)?: Total Help needed standing up from a chair using your arms (e.g., wheelchair or bedside chair)?: Total Help needed to walk in hospital room?: Total Help needed climbing 3-5 steps with a railing? : Total 6 Click Score: 6    End of Session Equipment Utilized During Treatment: Gait belt Activity Tolerance: Patient limited by pain;Patient limited by fatigue Patient left: in chair;with call bell/phone within reach;with chair alarm set Nurse Communication: Mobility status PT Visit Diagnosis: Other abnormalities of gait and mobility (R26.89)     Time:  -     Charges:  $Therapeutic Activity: 38-52 mins                     Delice Bison, PT  Acute Rehab Dept [facility]) [phone] Pager [phone]  06/08/2021    [facility] 06/08/2021, 3:37 PM

## 2021-06-09 DIAGNOSIS — S72002A Fracture of unspecified part of neck of left femur, initial encounter for closed fracture: Secondary | ICD-10-CM | POA: Diagnosis not present

## 2021-06-09 DIAGNOSIS — E039 Hypothyroidism, unspecified: Secondary | ICD-10-CM | POA: Diagnosis not present

## 2021-06-09 NOTE — Plan of Care (Signed)
  Problem: Pain Management: Goal: Pain level will decrease 06/09/2021 2234 by Virgilio Belling, RN Outcome: Progressing 06/09/2021 2232 by Virgilio Belling, RN Outcome: Progressing   Problem: Safety: Goal: Ability to remain free from injury will improve 06/09/2021 2234 by Virgilio Belling, RN Outcome: Progressing 06/09/2021 2232 by Virgilio Belling, RN Outcome: Progressing

## 2021-06-09 NOTE — Progress Notes (Signed)
PROGRESS NOTE    Catherine Terrell  OAC:[id number] DOB: November 05, 1932 DOA: 05/31/2021 PCP: System, Provider Not In    Brief Narrative:  87 y.o. female with medical history significant for ILD, hypothyroidism, HLD, cognitive impairment who presented to ER via EMS after fall at home. Pt presented with L hip fracture.  Admitted for surgical stabilization. Patient traveling from [place] to visit her son and fell in the bathroom.  Apparently independent and traveled alone from [place] to [place].  Postop course complicated with development of encephalopathy, persistent confusion and delirium that is improving now. 9/4.  Patient is more interactive.  Assessment & Plan:   Principal Problem:   Closed left hip fracture (HCC) Active Problems:   Interstitial lung disease (HCC)   Hypothyroidism   MCI (mild cognitive impairment)  Principal Problem: Closed traumatic left hip fracture:  Status post ORIF with IM nail intertrochanteric left 8/28 Adequate pain medications, mostly controlled with oral Vicodin and Tylenol with Robaxin.  Use laxatives to avoid constipation. Aspirin 80 mg twice daily for DVT prophylaxis. Partial weightbearing left leg.  Continue PT OT.  Difficult disposition with no adequate support at home.   Active Problems:   Interstitial lung disease  -Stable on room air.  -Seems to be stable at this time -Continued on pirfenidone.     Hypothyroidism -Continue with levothyroxine as tolerated   Acute metabolic encephalopathy in a patient with underlying MCI (mild cognitive impairment) -Pt is on Aricept and Namenda -Presently stable.  Mental status improving. -Patient developed significant lethargy and disorientation probably related to anesthesia and sedation.  Also with language barrier and hard of hearing. -No focal deficits.  Acute blood loss anemia Required 2 units of PRBC.  Stabilized.  Bladder outlet obstruction -Recently noted to have evidence of bladder outlet  obstruction, requiring serial in and out caths. -Flomax started on 06/03/2021 -Urinating adequately.  DVT prophylaxis: SCD's Code Status: Full Family Communication: Son on the phone 9/3.  Status is: Inpatient  Remains inpatient appropriate because:Inpatient level of care appropriate due to severity of illness  Dispo: The patient is from: Home              Anticipated d/c is to: SNF              Patient currently is not medically stable to d/c.   Difficult to place patient yes.  Patient does not have insurance coverage for [facility], does not have safe disposition home.   Consultants:  Orthopedic Surgery  Procedures:  Open reduction internal fixation with a trochanteric intramedullary nail to proximal left femur  Antimicrobials: Anti-infectives (From admission, onward)    Start     Dose/Rate Route Frequency Ordered Stop   06/02/21 1830  ceFAZolin (ANCEF) IVPB 2g/100 mL premix        2 g 200 mL/hr over 30 Minutes Intravenous Every 6 hours 06/02/21 1454 06/03/21 0117   06/02/21 0830  ceFAZolin (ANCEF) IVPB 2g/100 mL premix        2 g 200 mL/hr over 30 Minutes Intravenous On call to O.R. 06/02/21 0744 06/02/21 1228   06/02/21 0600  ceFAZolin (ANCEF) IVPB 2g/100 mL premix  Status:  Discontinued        2 g 200 mL/hr over 30 Minutes Intravenous On call to O.R. 06/01/21 1333 06/02/21 0748       Subjective: Patient seen and examined.  Alert awake but sleepy in the morning.  She is able to keep up with with conversation but is still not  oriented fully.  No overnight events.  Objective: Vitals:   06/08/21 0524 06/08/21 1502 06/08/21 2119 06/09/21 0618  BP: (!) 170/92 121/62 (!) 154/83 (!) 174/80  Pulse: (!) 110 (!) 104 85 77  Resp: 20 16 16 16   Temp: 98.5 F (36.9 C) 97.7 F (36.5 C) (!) 97.5 F (36.4 C) 97.8 F (36.6 C)  TempSrc: Axillary Oral Oral Oral  SpO2: 98% 98% 100% 98%  Weight:      Height:        Intake/Output Summary (Last 24 hours) at 06/09/2021  1122 Last data filed at 06/09/2021 1100 Gross per 24 hour  Intake 678.55 ml  Output 1050 ml  Net -371.45 ml   Filed Weights   05/31/21 2309 06/02/21 0233  Weight: 46.8 kg 46.8 kg    Examination:  General: Looks comfortable.  Frail and debilitated.  On room air.   She is alert and awake but not oriented. Cardiovascular: S1-S2 normal.  No added sounds. Respiratory: Bilateral clear.  No added sounds. Gastrointestinal: Soft and nontender. Ext: No cyanosis or edema.  Left hip lateral thigh incision clean and dry. Neuro: Alert but not oriented.  No focal deficits.     Data Reviewed: I have personally reviewed following labs and imaging studies  CBC: Recent Labs  Lab 06/03/21 0539 06/04/21 0320 06/04/21 2012 06/05/21 0325  WBC 12.4* 9.1  --  11.5*  NEUTROABS 9.4*  --   --   --   HGB 7.4* 5.7* 10.8* 11.6*  HCT 22.5* 17.5* 32.5* 33.7*  MCV 102.3* 101.7*  --  91.8  PLT 302 262  --  295   Basic Metabolic Panel: Recent Labs  Lab 06/03/21 0330 06/04/21 0320 06/05/21 0325  NA 129* 131* 133*  K 4.8 4.4 3.8  CL 100 103 99  CO2 23 24 25   GLUCOSE 114* 99 120*  BUN 28* 24* 15  CREATININE 1.02* 0.84 0.50  CALCIUM 8.0* 7.7* 8.3*   GFR: Estimated Creatinine Clearance: 32.3 mL/min (by C-G formula based on SCr of 0.5 mg/dL). Liver Function Tests: Recent Labs  Lab 06/05/21 0325  AST 107*  ALT 45*  ALKPHOS 45  BILITOT 0.8  PROT 5.9*  ALBUMIN 2.5*   No results for input(s): LIPASE, AMYLASE in the last 168 hours. No results for input(s): AMMONIA in the last 168 hours. Coagulation Profile: No results for input(s): INR, PROTIME in the last 168 hours. Cardiac Enzymes: No results for input(s): CKTOTAL, CKMB, CKMBINDEX, TROPONINI in the last 168 hours. BNP (last 3 results) No results for input(s): PROBNP in the last 8760 hours. HbA1C: No results for input(s): HGBA1C in the last 72 hours. CBG: No results for input(s): GLUCAP in the last 168 hours. Lipid Profile: No  results for input(s): CHOL, HDL, LDLCALC, TRIG, CHOLHDL, LDLDIRECT in the last 72 hours. Thyroid Function Tests: No results for input(s): TSH, T4TOTAL, FREET4, T3FREE, THYROIDAB in the last 72 hours. Anemia Panel: No results for input(s): VITAMINB12, FOLATE, FERRITIN, TIBC, IRON, RETICCTPCT in the last 72 hours. Sepsis Labs: No results for input(s): PROCALCITON, LATICACIDVEN in the last 168 hours.  Recent Results (from the past 240 hour(s))  Resp Panel by RT-PCR (Flu A&B, Covid) Nasopharyngeal Swab     Status: None   Collection Time: 05/31/21  8:24 PM   Specimen: Nasopharyngeal Swab; Nasopharyngeal(NP) swabs in vial transport medium  Result Value Ref Range Status   SARS Coronavirus 2 by RT PCR NEGATIVE NEGATIVE Final    Comment: (NOTE) SARS-CoV-2 target nucleic acids are  NOT DETECTED.  The SARS-CoV-2 RNA is generally detectable in upper respiratory specimens during the acute phase of infection. The lowest concentration of SARS-CoV-2 viral copies this assay can detect is 138 copies/mL. A negative result does not preclude SARS-Cov-2 infection and should not be used as the sole basis for treatment or other patient management decisions. A negative result may occur with  improper specimen collection/handling, submission of specimen other than nasopharyngeal swab, presence of viral mutation(s) within the areas targeted by this assay, and inadequate number of viral copies(<138 copies/mL). A negative result must be combined with clinical observations, patient history, and epidemiological information. The expected result is Negative.  Fact Sheet for Patients:  BloggerCourse.com  Fact Sheet for Healthcare Providers:  SeriousBroker.it  This test is no t yet approved or cleared by the [place] FDA and  has been authorized for detection and/or diagnosis of SARS-CoV-2 by FDA under an Emergency Use Authorization (EUA). This EUA will remain   in effect (meaning this test can be used) for the duration of the COVID-19 declaration under Section 564(b)(1) of the Act, 21 U.S.C.section 360bbb-3(b)(1), unless the authorization is terminated  or revoked sooner.       Influenza A by PCR NEGATIVE NEGATIVE Final   Influenza B by PCR NEGATIVE NEGATIVE Final    Comment: (NOTE) The Xpert Xpress SARS-CoV-2/FLU/RSV plus assay is intended as an aid in the diagnosis of influenza from Nasopharyngeal swab specimens and should not be used as a sole basis for treatment. Nasal washings and aspirates are unacceptable for Xpert Xpress SARS-CoV-2/FLU/RSV testing.  Fact Sheet for Patients: BloggerCourse.com  Fact Sheet for Healthcare Providers: SeriousBroker.it  This test is not yet approved or cleared by the [place] FDA and has been authorized for detection and/or diagnosis of SARS-CoV-2 by FDA under an Emergency Use Authorization (EUA). This EUA will remain in effect (meaning this test can be used) for the duration of the COVID-19 declaration under Section 564(b)(1) of the Act, 21 U.S.C. section 360bbb-3(b)(1), unless the authorization is terminated or revoked.  Performed at [facility], [address], [place], [place] [address]   Surgical PCR screen     Status: None   Collection Time: 06/01/21  6:31 PM   Specimen: Nasal Mucosa; Nasal Swab  Result Value Ref Range Status   MRSA, PCR NEGATIVE NEGATIVE Final   Staphylococcus aureus NEGATIVE NEGATIVE Final    Comment: (NOTE) The Xpert SA Assay (FDA approved for NASAL specimens in patients 72 years of age and older), is one component of a comprehensive surveillance program. It is not intended to diagnose infection nor to guide or monitor treatment. Performed at [facility], [address], [place], [place] [address]       Radiology Studies: No results found.  Scheduled Meds:  aspirin   81 mg Oral BID   atorvastatin  10 mg Oral Daily   docusate sodium  100 mg Oral BID   donepezil  5 mg Oral QHS   levothyroxine  100 mcg Oral Once per day on Sun Fri Sat   levothyroxine  50 mcg Oral Once per day on Mon Tue Wed Thu   memantine  5 mg Oral Daily   Pirfenidone  801 mg Oral TID   polyethylene glycol  17 g Oral Daily   senna-docusate  1 tablet Oral QHS   tamsulosin  0.4 mg Oral Daily   Continuous Infusions:  methocarbamol (ROBAXIN) IV       LOS: 9 days  Total time spent: 30 minutes  Dorcas CarrowKuber Avelynn Sellin, MD [facility] Pager On Amion  If 7PM-7AM, please contact night-coverage 06/09/2021, 11:22 AM

## 2021-06-09 NOTE — Progress Notes (Signed)
Physical Therapy Treatment Patient Details Name: Catherine Terrell MRN: [id number] DOB: [birth year] Today's Date: 06/09/2021    History of Present Illness 87 yo female adm after fall resulting in L IT hip fx. PMH: bil TKAs (done in [place]), dementia    PT Comments    Pt reluctant to participate today, has taken out her hearing aides and per son she only wants to sleep/rest. With encouragement pt agrees to sit EOB and work on sit to stand transfers which are slowly improving.  Discussed possible plan/goals with son. Will continue to work towards pt being able to transfer with assist of 1 person, discussed [facility] being an option for home as well.  Son appreciative of PT efforts Continue to follow.    Follow Up Recommendations  Other (comment) (HH vs SNF (no ins in [place]))     Equipment Recommendations  Rolling walker with 5" wheels;Wheelchair (measurements PT);Wheelchair cushion (measurements PT);Hospital bed;3in1 (PT)    Recommendations for Other Services       Precautions / Restrictions Precautions Precautions: Fall Restrictions Weight Bearing Restrictions: Yes LLE Weight Bearing: Partial weight bearing LLE Partial Weight Bearing Percentage or Pounds: 50%    Mobility  Bed Mobility Overal bed mobility: Needs Assistance Bed Mobility: Supine to Sit;Rolling Rolling: Max assist   Supine to sit: Mod assist;Max assist;HOB elevated Sit to supine: Max assist;+2 for safety/equipment;+2 for physical assistance   General bed mobility comments: assist with LEs and trunk, visual and tactile cues for  pt  to participate with supine to sit.  less grimacing with pain with hip flexion/movement. pt folds her arms, closes eyes and declines to participate with sit to supien transfer    Transfers Overall transfer level: Needs assistance Equipment used: Rolling walker (2 wheeled) Transfers: Sit to/from Stand Sit to Stand: Mod assist;Max assist;+2 physical assistance;+2 safety/equipment          General transfer comment: multi-modal cues for hand placement, to power up with RLE and incr hip/trunk extension.( son translating). pt able to assist with anterior-superior wt shift after LLE repositioned in neutral and knee flexion; sit<>stand x2 trials, incr pt effort on 2nd trial. pt declined 3rd trial d/t being very "sleepy"  Ambulation/Gait                 Stairs             Wheelchair Mobility    Modified Rankin (Stroke Patients Only)       Balance             Standing balance-Leahy Scale: Zero Standing balance comment: zero to poor, pt heavily reliant on UEs and external assist                            Cognition Arousal/Alertness: Awake/alert Behavior During Therapy: [facility] for tasks assessed/performed;Anxious Overall Cognitive Status: History of cognitive impairments - at baseline                                 General Comments: pt speaking more English at times today, son present acting as Nurse, learning disability however, even  he cannot understand her at times      Exercises General Exercises - Lower Extremity Heel Slides: AAROM;Left;10 reps Hip ABduction/ADduction: AAROM;Left;10 reps    General Comments        Pertinent Vitals/Pain Pain Assessment: Faces Faces Pain Scale: Hurts a little bit Pain Location:  L hip/thigh Pain Descriptors / Indicators: Grimacing Pain Intervention(s): Limited activity within patient's tolerance;Monitored during session;Premedicated before session;Repositioned    Home Living                      Prior Function            PT Goals (current goals can now be found in the care plan section) Acute Rehab PT Goals Patient Stated Goal: unable to state PT Goal Formulation: Patient unable to participate in goal setting Time For Goal Achievement: 06/10/21 Potential to Achieve Goals: Fair Progress towards PT goals: Progressing toward goals    Frequency    Min 3X/week      PT Plan  Current plan remains appropriate    Co-evaluation              AM-PAC PT "6 Clicks" Mobility   Outcome Measure  Help needed turning from your back to your side while in a flat bed without using bedrails?: Total Help needed moving from lying on your back to sitting on the side of a flat bed without using bedrails?: Total Help needed moving to and from a bed to a chair (including a wheelchair)?: Total Help needed standing up from a chair using your arms (e.g., wheelchair or bedside chair)?: Total Help needed to walk in hospital room?: Total Help needed climbing 3-5 steps with a railing? : Total 6 Click Score: 6    End of Session Equipment Utilized During Treatment: Gait belt Activity Tolerance: Patient limited by fatigue Patient left: in bed;with call bell/phone within reach;with bed alarm set;with family/visitor present Nurse Communication: Mobility status PT Visit Diagnosis: Other abnormalities of gait and mobility (R26.89)     Time: 2376-2831 PT Time Calculation (min) (ACUTE ONLY): 39 min  Charges:  $Therapeutic Activity: 38-52 mins                     Delice Bison, PT  Acute Rehab Dept ([facility]) [phone] Pager [phone]  06/09/2021    [facility] 06/09/2021, 2:48 PM

## 2021-06-09 NOTE — Plan of Care (Signed)
  Problem: Pain Management: Goal: Pain level will decrease Outcome: Progressing   Problem: Clinical Measurements: Goal: Will remain free from infection Outcome: Progressing   Problem: Education: Goal: Knowledge of General Education information will improve Description: Including pain rating scale, medication(s)/side effects and non-pharmacologic comfort measures Outcome: Progressing

## 2021-06-10 DIAGNOSIS — E039 Hypothyroidism, unspecified: Secondary | ICD-10-CM | POA: Diagnosis not present

## 2021-06-10 DIAGNOSIS — S72002A Fracture of unspecified part of neck of left femur, initial encounter for closed fracture: Secondary | ICD-10-CM | POA: Diagnosis not present

## 2021-06-10 NOTE — Progress Notes (Signed)
PROGRESS NOTE    Catherine Terrell  ZDG:[id number] DOB: March 09, 1933 DOA: 05/31/2021 PCP: System, Provider Not In    Brief Narrative:  87 y.o. female with medical history significant for ILD, hypothyroidism, HLD, cognitive impairment who presented to ER via EMS after fall at home. Pt presented with L hip fracture.  Admitted for surgical stabilization. Patient traveling from [place] to visit her son and fell in the bathroom.  Apparently independent and traveled alone from [place] to [place].  Postop course complicated with development of encephalopathy, persistent confusion and delirium that is improving now. 9/4.  Patient is more interactive and working with physical therapy.  Still unsafe to go home.  Assessment & Plan:   Principal Problem:   Closed left hip fracture (HCC) Active Problems:   Interstitial lung disease (HCC)   Hypothyroidism   MCI (mild cognitive impairment)  Principal Problem: Closed traumatic left hip fracture:  Status post ORIF with IM nail intertrochanteric left 8/28 Adequate pain medications, mostly controlled with oral Vicodin and Tylenol with Robaxin.  Use laxatives to avoid constipation. Aspirin 80 mg twice daily for DVT prophylaxis. Partial weightbearing left leg.  Continue PT OT.  Difficult disposition with no adequate support at home.   Active Problems:   Interstitial lung disease  -Stable on room air.  -Seems to be stable at this time -Continued on pirfenidone.     Hypothyroidism -Continue with levothyroxine as tolerated   Acute metabolic encephalopathy in a patient with underlying MCI (mild cognitive impairment) -Pt is on Aricept and Namenda -Presently stable.  Mental status improving. -Patient developed significant lethargy and disorientation probably related to anesthesia and sedation.  Also with language barrier and hard of hearing. -No focal deficits. -Mental status mostly improved.  Acute blood loss anemia Required 2 units of PRBC.   Stabilized.  Bladder outlet obstruction -Recently noted to have evidence of bladder outlet obstruction, requiring serial in and out caths. -Flomax started on 06/03/2021 -Urinating adequately.  DVT prophylaxis: SCD's Code Status: Full Family Communication: None.  Son unable to pick up.  Status is: Inpatient  Remains inpatient appropriate because:Inpatient level of care appropriate due to severity of illness  Dispo: The patient is from: Home              Anticipated d/c is to: SNF versus home with home health.              Patient currently is not medically stable to d/c.   Difficult to place patient yes.  [facility], does not have safe disposition home.   Consultants:  Orthopedic Surgery  Procedures:  Open reduction internal fixation with a trochanteric intramedullary nail to proximal left femur  Antimicrobials: Anti-infectives (From admission, onward)    Start     Dose/Rate Route Frequency Ordered Stop   06/02/21 1830  ceFAZolin (ANCEF) IVPB 2g/100 mL premix        2 g 200 mL/hr over 30 Minutes Intravenous Every 6 hours 06/02/21 1454 06/03/21 0117   06/02/21 0830  ceFAZolin (ANCEF) IVPB 2g/100 mL premix        2 g 200 mL/hr over 30 Minutes Intravenous On call to O.R. 06/02/21 0744 06/02/21 1228   06/02/21 0600  ceFAZolin (ANCEF) IVPB 2g/100 mL premix  Status:  Discontinued        2 g 200 mL/hr over 30 Minutes Intravenous On call to O.R. 06/01/21 1333 06/02/21 0748       Subjective: Patient seen and examined.  Denies any complaints.  She tells me to go get her a new set of batteries for her hearing aid.  She is persistence on it.  Laxative resulted in bowel movement and no more problems.  Objective: Vitals:   06/09/21 0618 06/09/21 1341 06/09/21 2228 06/10/21 0531  BP: (!) 174/80 128/72 (!) 145/84 (!) 147/67  Pulse: 77 (!) 106 92 68  Resp: 16 17 (!) 21 18  Temp: 97.8 F (36.6 C) 98.1 F (36.7 C) 98.9 F (37.2 C) 98  F (36.7 C)  TempSrc: Oral  Axillary Axillary  SpO2: 98% 99% 97% 97%  Weight:      Height:        Intake/Output Summary (Last 24 hours) at 06/10/2021 1105 Last data filed at 06/10/2021 1047 Gross per 24 hour  Intake 330 ml  Output 500 ml  Net -170 ml   Filed Weights   05/31/21 2309 06/02/21 0233  Weight: 46.8 kg 46.8 kg    Examination:  General: Looks comfortable.  Frail and debilitated.  On room air.   She is alert and awake but not oriented. Cardiovascular: S1-S2 normal.  No added sounds. Respiratory: Bilateral clear.  No added sounds. Gastrointestinal: Soft and nontender. Ext: No cyanosis or edema.  Left hip lateral thigh incision clean and dry. Neuro: Alert but not oriented.  No focal deficits.     Data Reviewed: I have personally reviewed following labs and imaging studies  CBC: Recent Labs  Lab 06/04/21 0320 06/04/21 2012 06/05/21 0325  WBC 9.1  --  11.5*  HGB 5.7* 10.8* 11.6*  HCT 17.5* 32.5* 33.7*  MCV 101.7*  --  91.8  PLT 262  --  295   Basic Metabolic Panel: Recent Labs  Lab 06/04/21 0320 06/05/21 0325  NA 131* 133*  K 4.4 3.8  CL 103 99  CO2 24 25  GLUCOSE 99 120*  BUN 24* 15  CREATININE 0.84 0.50  CALCIUM 7.7* 8.3*   GFR: Estimated Creatinine Clearance: 32.3 mL/min (by C-G formula based on SCr of 0.5 mg/dL). Liver Function Tests: Recent Labs  Lab 06/05/21 0325  AST 107*  ALT 45*  ALKPHOS 45  BILITOT 0.8  PROT 5.9*  ALBUMIN 2.5*   No results for input(s): LIPASE, AMYLASE in the last 168 hours. No results for input(s): AMMONIA in the last 168 hours. Coagulation Profile: No results for input(s): INR, PROTIME in the last 168 hours. Cardiac Enzymes: No results for input(s): CKTOTAL, CKMB, CKMBINDEX, TROPONINI in the last 168 hours. BNP (last 3 results) No results for input(s): PROBNP in the last 8760 hours. HbA1C: No results for input(s): HGBA1C in the last 72 hours. CBG: No results for input(s): GLUCAP in the last 168  hours. Lipid Profile: No results for input(s): CHOL, HDL, LDLCALC, TRIG, CHOLHDL, LDLDIRECT in the last 72 hours. Thyroid Function Tests: No results for input(s): TSH, T4TOTAL, FREET4, T3FREE, THYROIDAB in the last 72 hours. Anemia Panel: No results for input(s): VITAMINB12, FOLATE, FERRITIN, TIBC, IRON, RETICCTPCT in the last 72 hours. Sepsis Labs: No results for input(s): PROCALCITON, LATICACIDVEN in the last 168 hours.  Recent Results (from the past 240 hour(s))  Resp Panel by RT-PCR (Flu A&B, Covid) Nasopharyngeal Swab     Status: None   Collection Time: 05/31/21  8:24 PM   Specimen: Nasopharyngeal Swab; Nasopharyngeal(NP) swabs in vial transport medium  Result Value Ref Range Status   SARS Coronavirus 2 by RT PCR NEGATIVE NEGATIVE Final    Comment: (NOTE) SARS-CoV-2 target nucleic acids are  NOT DETECTED.  The SARS-CoV-2 RNA is generally detectable in upper respiratory specimens during the acute phase of infection. The lowest concentration of SARS-CoV-2 viral copies this assay can detect is 138 copies/mL. A negative result does not preclude SARS-Cov-2 infection and should not be used as the sole basis for treatment or other patient management decisions. A negative result may occur with  improper specimen collection/handling, submission of specimen other than nasopharyngeal swab, presence of viral mutation(s) within the areas targeted by this assay, and inadequate number of viral copies(<138 copies/mL). A negative result must be combined with clinical observations, patient history, and epidemiological information. The expected result is Negative.  Fact Sheet for Patients:  BloggerCourse.com  Fact Sheet for Healthcare Providers:  SeriousBroker.it  This test is no t yet approved or cleared by the [place] FDA and  has been authorized for detection and/or diagnosis of SARS-CoV-2 by FDA under an Emergency Use Authorization  (EUA). This EUA will remain  in effect (meaning this test can be used) for the duration of the COVID-19 declaration under Section 564(b)(1) of the Act, 21 U.S.C.section 360bbb-3(b)(1), unless the authorization is terminated  or revoked sooner.       Influenza A by PCR NEGATIVE NEGATIVE Final   Influenza B by PCR NEGATIVE NEGATIVE Final    Comment: (NOTE) The Xpert Xpress SARS-CoV-2/FLU/RSV plus assay is intended as an aid in the diagnosis of influenza from Nasopharyngeal swab specimens and should not be used as a sole basis for treatment. Nasal washings and aspirates are unacceptable for Xpert Xpress SARS-CoV-2/FLU/RSV testing.  Fact Sheet for Patients: BloggerCourse.com  Fact Sheet for Healthcare Providers: SeriousBroker.it  This test is not yet approved or cleared by the [place] FDA and has been authorized for detection and/or diagnosis of SARS-CoV-2 by FDA under an Emergency Use Authorization (EUA). This EUA will remain in effect (meaning this test can be used) for the duration of the COVID-19 declaration under Section 564(b)(1) of the Act, 21 U.S.C. section 360bbb-3(b)(1), unless the authorization is terminated or revoked.  Performed at [facility], [address] [place], [place] [address]   Surgical PCR screen     Status: None   Collection Time: 06/01/21  6:31 PM   Specimen: Nasal Mucosa; Nasal Swab  Result Value Ref Range Status   MRSA, PCR NEGATIVE NEGATIVE Final   Staphylococcus aureus NEGATIVE NEGATIVE Final    Comment: (NOTE) The Xpert SA Assay (FDA approved for NASAL specimens in patients 56 years of age and older), is one component of a comprehensive surveillance program. It is not intended to diagnose infection nor to guide or monitor treatment. Performed at [facility], [address], [place], [place] [address]       Radiology Studies: No results  found.  Scheduled Meds:  aspirin  81 mg Oral BID   atorvastatin  10 mg Oral Daily   docusate sodium  100 mg Oral BID   donepezil  5 mg Oral QHS   levothyroxine  100 mcg Oral Once per day on Sun Fri Sat   levothyroxine  50 mcg Oral Once per day on Mon Tue Wed Thu   memantine  5 mg Oral Daily   Pirfenidone  801 mg Oral TID   polyethylene glycol  17 g Oral Daily   senna-docusate  1 tablet Oral QHS   tamsulosin  0.4 mg Oral Daily   Continuous Infusions:  methocarbamol (ROBAXIN) IV       LOS: 10 days  Total time spent: 30 minutes  Dorcas Carrow, MD [facility] Pager On Amion  If 7PM-7AM, please contact night-coverage 06/10/2021, 11:05 AM

## 2021-06-10 NOTE — Progress Notes (Signed)
Orthopedics Progress Note  Subjective: Patient seems comfortable. Does not speak english  Objective:  Vitals:   06/09/21 2228 06/10/21 0531  BP: (!) 145/84 (!) 147/67  Pulse: 92 68  Resp: (!) 21 18  Temp: 98.9 F (37.2 C) 98 F (36.7 C)  SpO2: 97% 97%    General: Awake and alert , follows commands Musculoskeletal: Left hip dressing CDI, compartments supple and no left hip swelling, distally NVI Neurovascularly intact  Lab Results  Component Value Date   WBC 11.5 (H) 06/05/2021   HGB 11.6 (L) 06/05/2021   HCT 33.7 (L) 06/05/2021   MCV 91.8 06/05/2021   PLT 295 06/05/2021       Component Value Date/Time   NA 133 (L) 06/05/2021 0325   K 3.8 06/05/2021 0325   CL 99 06/05/2021 0325   CO2 25 06/05/2021 0325   GLUCOSE 120 (H) 06/05/2021 0325   BUN 15 06/05/2021 0325   CREATININE 0.50 06/05/2021 0325   CALCIUM 8.3 (L) 06/05/2021 0325   GFRNONAA >60 06/05/2021 0325    No results found for: INR, PROTIME  Assessment/Plan: s/p Procedure(s): INTRAMEDULLARY (IM) NAIL INTERTROCHANTRIC Stable from ortho standpoint, outpatient follow up with ortho  Viviann Spare R. Ranell Patrick, MD 06/10/2021 7:57 AM

## 2021-06-10 NOTE — Progress Notes (Addendum)
Physical Therapy Treatment Patient Details Name: Catherine Terrell MRN: [id number] DOB: 15-Mar-1933 Today's Date: 06/10/2021    History of Present Illness 87 yo female adm after fall resulting in L IT hip fx. PMH: bil TKAs (done in [place]), dementia    PT Comments    Pt progressing today, able to amb short distance x2 with min to mod assist +2. Much improved pt effort today, decr assist overall. Son present for session. Pt alert and pain controlled with tylenol only today. Will continue to see pt in acute setting, continue PT plan of care   Follow Up Recommendations  Other (comment) (HH vs SNF (no ins in [place]))     Equipment Recommendations  Rolling walker with 5" wheels;Wheelchair (measurements PT);Wheelchair cushion (measurements PT);Hospital bed;3in1 (PT)    Recommendations for Other Services       Precautions / Restrictions Precautions Precautions: Fall Restrictions Weight Bearing Restrictions: Yes LLE Weight Bearing: Partial weight bearing LLE Partial Weight Bearing Percentage or Pounds: 50%    Mobility  Bed Mobility Overal bed mobility: Needs Assistance Bed Mobility: Supine to Sit     Supine to sit: HOB elevated;Mod assist;Min assist     General bed mobility comments: assist with LEs and trunk, visual and tactile cues for sequencing  with supine to sit.    Transfers Overall transfer level: Needs assistance Equipment used: Rolling walker (2 wheeled) Transfers: Sit to/from Stand Sit to Stand: Mod assist;+2 physical assistance;Min assist;+2 safety/equipment Stand pivot transfers: Min assist;Mod assist;+2 physical assistance;+2 safety/equipment       General transfer comment: multi-modal cues for hand placement, to power up with RLE and incr hip/trunk extension.(son translating). pt able to assist with anterior-superior wt shift after LLE repositioned in neutral and knee flexion; repeated sit<>stand tranfers. incr pt effort today. 2 assist for balance, RW position and  safety for stand pivot Bed <>BSC, chair<>BSC  Ambulation/Gait Ambulation/Gait assistance: Min assist;+2 physical assistance;+2 safety/equipment;Mod assist Gait Distance (Feet): 15 Feet (10' more) Assistive device: Rolling walker (2 wheeled) Gait Pattern/deviations: Step-to pattern;Trunk flexed;Narrow base of support   General Gait Details: verbal and visual cues for incr BOS, step sequence, posture and RW position from self. Able to progress to min assist of one last 6' ( son is aware of PWB, pt is not yet able to comply with sequence consistently, however she is not placing full wt on LLE d/t pain)    Stair             Wheelchair Mobility    Modified Rankin (Stroke Patients Only)       Balance             Standing balance-Leahy Scale: Poor Standing balance comment: poor, pt heavily reliant on UEs and external assist                            Cognition Arousal/Alertness: Awake/alert Behavior During Therapy: [facility] for tasks assessed/performed;Anxious Overall Cognitive Status: History of cognitive impairments - at baseline                                 General Comments: pt speaking more English at times today, son present acting as Nurse, learning disability however, even  he cannot understand her at times      Exercises General Exercises - Lower Extremity Ankle Circles/Pumps: AROM;Both;10 reps Hip ABduction/ADduction: AAROM;Left;10 reps    General Comments  Pertinent Vitals/Pain Pain Assessment: 0-10 Faces Pain Scale: Hurts a little bit Pain Location: L hip/thigh Pain Descriptors / Indicators: Grimacing Pain Intervention(s): Monitored during session    Home Living                      Prior Function            PT Goals (current goals can now be found in the care plan section) Acute Rehab PT Goals Patient Stated Goal: unable to state PT Goal Formulation: Patient unable to participate in goal setting Time For Goal  Achievement: 06/10/21 Potential to Achieve Goals: Fair Progress towards PT goals: Progressing toward goals    Frequency    Min 3X/week      PT Plan Current plan remains appropriate    Co-evaluation              AM-PAC PT "6 Clicks" Mobility   Outcome Measure  Help needed turning from your back to your side while in a flat bed without using bedrails?: A Lot Help needed moving from lying on your back to sitting on the side of a flat bed without using bedrails?: A Lot Help needed moving to and from a bed to a chair (including a wheelchair)?: A Lot Help needed standing up from a chair using your arms (e.g., wheelchair or bedside chair)?: A Lot Help needed to walk in hospital room?: A Lot Help needed climbing 3-5 steps with a railing? : A Lot 6 Click Score: 12    End of Session Equipment Utilized During Treatment: Gait belt Activity Tolerance: Patient limited by fatigue Patient left: with call bell/phone within reach;with family/visitor present;in chair;with chair alarm set Nurse Communication: Mobility status PT Visit Diagnosis: Other abnormalities of gait and mobility (R26.89)     Time: 7622-6333 PT Time Calculation (min) (ACUTE ONLY): 46 min  Charges:  $Gait Training: 23-37 mins $Therapeutic Activity: 8-22 mins                     Delice Bison, PT  Acute Rehab Dept ([facility]) [phone] Pager [phone]  06/10/2021    [facility] 06/10/2021, 3:59 PM

## 2021-06-11 DIAGNOSIS — S72002A Fracture of unspecified part of neck of left femur, initial encounter for closed fracture: Secondary | ICD-10-CM | POA: Diagnosis not present

## 2021-06-11 DIAGNOSIS — E039 Hypothyroidism, unspecified: Secondary | ICD-10-CM | POA: Diagnosis not present

## 2021-06-11 NOTE — Progress Notes (Signed)
Physical Therapy Treatment Patient Details Name: Catherine Terrell MRN: [id number] DOB: 29-Apr-1933 Today's Date: 06/11/2021    History of Present Illness 87 yo female adm after fall resulting in L IT hip fx. PMH: bil TKAs (done in [place]), dementia    PT Comments    Patient continues to make steady progress with therapy and is demonstrating increased motivation. At start of session pt eagerly asking to start getting up and walk. However pt does fatigue quickly and requires frequent rest breaks. Mod+2 assist for power up form EOB and recliner. Pt ambulate 2 short bouts (6' and 12') with RW and Min+2 assist. Step length and width improved on second bout. Acute PT will continue to progress pt as able, frequency increased as pt has limited discharge options for rehab and Acute therapies need to be maximized. OT ordered.     Follow Up Recommendations  Other (comment) (HH vs SNF (no ins in [place]))     Equipment Recommendations  Rolling walker with 5" wheels;Wheelchair (measurements PT);Wheelchair cushion (measurements PT);Hospital bed;3in1 (PT)    Recommendations for Other Services       Precautions / Restrictions Precautions Precautions: Fall Restrictions Weight Bearing Restrictions: Yes LLE Weight Bearing: Partial weight bearing LLE Partial Weight Bearing Percentage or Pounds: 50%    Mobility  Bed Mobility Overal bed mobility: Needs Assistance Bed Mobility: Supine to Sit     Supine to sit: HOB elevated;Mod assist;Min assist     General bed mobility comments: tactile cues to initiate LE's off EOB but pt more participatory and with cues from son began moving Rt LE off EOB and pulling up with bed rail, min assist for LE's, Mod assist to bring trunk upright.    Transfers Overall transfer level: Needs assistance Equipment used: Rolling walker (2 wheeled) Transfers: Sit to/from Stand Sit to Stand: Mod assist;+2 physical assistance;Min assist;+2 safety/equipment         General  transfer comment: multimodal cues for technique/hand palcement, pt attempting use of bil UE on RW and front cross bar (verbal/translating cues form son) pt able to press up with single UE and Mod+2 assist from EOB. pt completed 2x from EOB. Additional 3 sit<>stands at EOS from recliner with bil UE use on armrest. Lt LE reting internalyl rotated and manual cues to facilitate neutral hip position.  Ambulation/Gait Ambulation/Gait assistance: Min assist;+2 physical assistance;+2 safety/equipment;Mod assist Gait Distance (Feet): 18 Feet (6,12) Assistive device: Rolling walker (2 wheeled) Gait Pattern/deviations: Step-to pattern;Trunk flexed;Narrow base of support     General Gait Details: Assist to manage walker position and multimodal cues for upright posture to reduce anterior head position and posterior lean at hips. pt steps on second bout with improved BOS and incresaed step length.   Stairs             Wheelchair Mobility    Modified Rankin (Stroke Patients Only)       Balance             Standing balance-Leahy Scale: Poor Standing balance comment: poor, pt heavily reliant on UEs and external assist                            Cognition Arousal/Alertness: Awake/alert Behavior During Therapy: WFL for tasks assessed/performed;Anxious Overall Cognitive Status: History of cognitive impairments - at baseline  General Comments: phone interpreter at start to assist pt off bedpan with RN, son present for mobiltiy OOB acting as translator however      Exercises General Exercises - Lower Extremity Ankle Circles/Pumps: AROM;Both;20 reps;Seated Quad Sets: AROM;Both;15 reps;Seated (tactile cue to complete)    General Comments        Pertinent Vitals/Pain Pain Assessment: Faces Faces Pain Scale: Hurts a little bit Pain Location: L hip/thigh Pain Descriptors / Indicators: Grimacing Pain Intervention(s): Limited  activity within patient's tolerance;Monitored during session;Repositioned;Premedicated before session    Home Living                      Prior Function            PT Goals (current goals can now be found in the care plan section) Acute Rehab PT Goals Patient Stated Goal: unable to state PT Goal Formulation: Patient unable to participate in goal setting Time For Goal Achievement: 06/10/21 Potential to Achieve Goals: Fair Progress towards PT goals: Progressing toward goals    Frequency    Min 4X/week (frequency updated due to difficult placement.)      PT Plan Current plan remains appropriate    Co-evaluation              AM-PAC PT "6 Clicks" Mobility   Outcome Measure  Help needed turning from your back to your side while in a flat bed without using bedrails?: A Lot Help needed moving from lying on your back to sitting on the side of a flat bed without using bedrails?: A Lot Help needed moving to and from a bed to a chair (including a wheelchair)?: A Lot Help needed standing up from a chair using your arms (e.g., wheelchair or bedside chair)?: A Lot Help needed to walk in hospital room?: A Lot Help needed climbing 3-5 steps with a railing? : A Lot 6 Click Score: 12    End of Session Equipment Utilized During Treatment: Gait belt Activity Tolerance: Patient limited by fatigue Patient left: with call bell/phone within reach;with family/visitor present;in chair;with chair alarm set Nurse Communication: Mobility status PT Visit Diagnosis: Other abnormalities of gait and mobility (R26.89)     Time: 3419-3790 PT Time Calculation (min) (ACUTE ONLY): 48 min  Charges:  $Gait Training: 23-37 mins $Therapeutic Activity: 8-22 mins                     Catherine Terrell, DPT Acute Rehabilitation Services Office [phone] Pager [phone]    Catherine Terrell 06/11/2021, 7:27 PM

## 2021-06-11 NOTE — Progress Notes (Signed)
PROGRESS NOTE    Catherine Terrell  UTM:[id number] DOB: 30-Jun-1933 DOA: 05/31/2021 PCP: System, Provider Not In    Brief Narrative:  87 y.o. female with medical history significant for ILD, hypothyroidism, HLD, cognitive impairment who presented to ER via EMS after fall at home. Pt presented with L hip fracture.  Admitted for surgical stabilization. Patient traveling from [place] to visit her son and fell in the bathroom.  Apparently independent and traveled alone from [place] to [place].  Postop course complicated with development of encephalopathy, persistent confusion and delirium that is improving now. Since 9/4   Patient is more interactive and working with physical therapy.  Still unsafe to go home as needing 2 person assist.  Patient has Medicaid for [place] no insurance for [employer].  Her son is not able to take her home.  She is not able to qualify to go to SNF because of not having insurance coverage.   Assessment & Plan:   Principal Problem:   Closed left hip fracture (HCC) Active Problems:   Interstitial lung disease (HCC)   Hypothyroidism   MCI (mild cognitive impairment)  Principal Problem: Closed traumatic left hip fracture:  Status post ORIF with IM nail intertrochanteric left 8/28 Adequate pain medications, mostly controlled with oral Vicodin and Tylenol with Robaxin.  Use laxatives to avoid constipation. Aspirin 80 mg twice daily for DVT prophylaxis. Partial weightbearing left leg.  Continue PT OT.  Difficult disposition with no adequate support at home.   Active Problems:   Interstitial lung disease  -Stable on room air.  -Seems to be stable at this time -Continued on pirfenidone.     Hypothyroidism -Continue with levothyroxine as tolerated   Acute metabolic encephalopathy in a patient with underlying MCI (mild cognitive impairment) -Pt is on Aricept and Namenda -Presently stable.  Mental status improving and almost back to baseline  now. -Patient developed significant lethargy and disorientation probably related to anesthesia and sedation.  Also with language barrier and hard of hearing. -No focal deficits. -Mental status mostly improved.  Acute blood loss anemia Required 2 units of PRBC.  Stabilized.  Bladder outlet obstruction -Recently noted to have evidence of bladder outlet obstruction, requiring serial in and out caths. -Flomax started on 06/03/2021 -Urinating adequately.  Patient remains in the hospital because of unsafe disposition.  Discussed and updated details to patient's son.  Patient will work with physical therapy in the hospital, once she becomes more ambulatory and at least able to be helped by her son, he can take her home with some private caregivers.  DVT prophylaxis: SCD's, aspirin. Code Status: Full Family Communication: Patient's son on the phone.  Status is: Inpatient  Remains inpatient appropriate because:Inpatient level of care appropriate due to severity of illness  Dispo: The patient is from: Home              Anticipated d/c is to: SNF versus home with home health.              Patient currently is not medically stable to d/c.   Difficult to place patient yes.  Patient does not have insurance coverage for [facility], does not have safe disposition home.   Consultants:  Orthopedic Surgery  Procedures:  Open reduction internal fixation with a trochanteric intramedullary nail to proximal left femur  Antimicrobials: Anti-infectives (From admission, onward)    Start     Dose/Rate Route Frequency Ordered Stop   06/02/21 1830  ceFAZolin (ANCEF) IVPB 2g/100 mL premix  2 g 200 mL/hr over 30 Minutes Intravenous Every 6 hours 06/02/21 1454 06/03/21 0117   06/02/21 0830  ceFAZolin (ANCEF) IVPB 2g/100 mL premix        2 g 200 mL/hr over 30 Minutes Intravenous On call to O.R. 06/02/21 0744 06/02/21 1228   06/02/21 0600  ceFAZolin (ANCEF) IVPB 2g/100 mL premix  Status:   Discontinued        2 g 200 mL/hr over 30 Minutes Intravenous On call to O.R. 06/01/21 1333 06/02/21 0748       Subjective: Patient seen and examined.  No overnight events.  Today she is alert awake and mostly demanding.  She was complaining about nursing staff not getting her medication and breakfast in time.  Denies any pain.  Objective: Vitals:   06/10/21 0531 06/10/21 1439 06/10/21 2047 06/11/21 0547  BP: (!) 147/67 137/68 119/80 134/78  Pulse: 68 70 88 99  Resp: 18 20 16 18   Temp: 98 F (36.7 C) 98.2 F (36.8 C) (!) 97.4 F (36.3 C) 98.2 F (36.8 C)  TempSrc: Axillary Axillary Oral Oral  SpO2: 97% 97% 100% 98%  Weight:      Height:        Intake/Output Summary (Last 24 hours) at 06/11/2021 1055 Last data filed at 06/11/2021 1000 Gross per 24 hour  Intake 670 ml  Output 1500 ml  Net -830 ml   Filed Weights   05/31/21 2309 06/02/21 0233  Weight: 46.8 kg 46.8 kg    Examination:  General: Looks comfortable.  Frail and debilitated.  On room air.   She is alert and awake and oriented x1.  Not oriented to place and time. Cardiovascular: S1-S2 normal.  No added sounds. Respiratory: Bilateral clear.  No added sounds. Gastrointestinal: Soft and nontender. Ext: No cyanosis or edema.  Left hip lateral thigh incision clean and dry. Neuro: Alert but not oriented.  No focal deficits.     Data Reviewed: I have personally reviewed following labs and imaging studies  CBC: Recent Labs  Lab 06/04/21 2012 06/05/21 0325  WBC  --  11.5*  HGB 10.8* 11.6*  HCT 32.5* 33.7*  MCV  --  91.8  PLT  --  295   Basic Metabolic Panel: Recent Labs  Lab 06/05/21 0325  NA 133*  K 3.8  CL 99  CO2 25  GLUCOSE 120*  BUN 15  CREATININE 0.50  CALCIUM 8.3*   GFR: Estimated Creatinine Clearance: 32.3 mL/min (by C-G formula based on SCr of 0.5 mg/dL). Liver Function Tests: Recent Labs  Lab 06/05/21 0325  AST 107*  ALT 45*  ALKPHOS 45  BILITOT 0.8  PROT 5.9*  ALBUMIN 2.5*    No results for input(s): LIPASE, AMYLASE in the last 168 hours. No results for input(s): AMMONIA in the last 168 hours. Coagulation Profile: No results for input(s): INR, PROTIME in the last 168 hours. Cardiac Enzymes: No results for input(s): CKTOTAL, CKMB, CKMBINDEX, TROPONINI in the last 168 hours. BNP (last 3 results) No results for input(s): PROBNP in the last 8760 hours. HbA1C: No results for input(s): HGBA1C in the last 72 hours. CBG: No results for input(s): GLUCAP in the last 168 hours. Lipid Profile: No results for input(s): CHOL, HDL, LDLCALC, TRIG, CHOLHDL, LDLDIRECT in the last 72 hours. Thyroid Function Tests: No results for input(s): TSH, T4TOTAL, FREET4, T3FREE, THYROIDAB in the last 72 hours. Anemia Panel: No results for input(s): VITAMINB12, FOLATE, FERRITIN, TIBC, IRON, RETICCTPCT in the last 72 hours. Sepsis Labs:  No results for input(s): PROCALCITON, LATICACIDVEN in the last 168 hours.  Recent Results (from the past 240 hour(s))  Surgical PCR screen     Status: None   Collection Time: 06/01/21  6:31 PM   Specimen: Nasal Mucosa; Nasal Swab  Result Value Ref Range Status   MRSA, PCR NEGATIVE NEGATIVE Final   Staphylococcus aureus NEGATIVE NEGATIVE Final    Comment: (NOTE) The Xpert SA Assay (FDA approved for NASAL specimens in patients 38 years of age and older), is one component of a comprehensive surveillance program. It is not intended to diagnose infection nor to guide or monitor treatment. Performed at [facility], [address] [place], [place] [address]       Radiology Studies: No results found.  Scheduled Meds:  aspirin  81 mg Oral BID   atorvastatin  10 mg Oral Daily   donepezil  5 mg Oral QHS   levothyroxine  100 mcg Oral Once per day on Sun Fri Sat   levothyroxine  50 mcg Oral Once per day on Mon Tue Wed Thu   memantine  5 mg Oral Daily   Pirfenidone  801 mg Oral TID   tamsulosin  0.4 mg Oral Daily    Continuous Infusions:  methocarbamol (ROBAXIN) IV       LOS: 11 days   Total time spent: 30 minutes  Dorcas Carrow, MD [facility] Pager On Amion  If 7PM-7AM, please contact night-coverage 06/11/2021, 10:55 AM

## 2021-06-11 NOTE — TOC Progression Note (Signed)
Transition of Care [facility]) - Progression Note    Patient Details  Name: Catherine Terrell MRN: [id number] Date of Birth: 08/01/1933  Transition of Care [facility]) CM/SW Contact  Amada Jupiter, LCSW Phone Number: 06/11/2021, 4:21 PM  Clinical Narrative:    Contacted by pt's son today who reports that he does not feel that he or his mother are any closer to being able to manage travel back to [place].  He feels they may be ready in "3 or 4 weeks... a month...".  In addition, he does not feel it is safe for him to bring her to his home because he will be working during the day and she would be alone which is "not safe."  He adds that he cannot afford privately paying for assistance or SNF.  He asks that I "do what you would do with other patient's in this situation."  Attempted to explain to son that pt cannot remain in the hospital and that we would ask any family to continue to work on arrangements to take the pt home. Have alerted [facility] supervisor of this situation.  We will reach out to [facility] to see if they could consider short term admission (but not hopeful).  Will, also, discuss with son the options with Medicaid change to [place] (if they are willing to plan to remain in [place].)  Very difficult situation.   Expected Discharge Plan: Home/Self Care Barriers to Discharge: Financial Resources, Inadequate or no insurance, Continued Medical Work up  Expected Discharge Plan and Services Expected Discharge Plan: Home/Self Care In-house Referral: Clinical Social Work                                             Social Determinants of Health (SDOH) Interventions    Readmission Risk Interventions No flowsheet data found.

## 2021-06-12 MED ORDER — SODIUM CHLORIDE 0.9 % IV BOLUS
250.0000 mL | Freq: Once | INTRAVENOUS | Status: AC
  Administered 2021-06-12: 250 mL via INTRAVENOUS

## 2021-06-12 NOTE — Evaluation (Signed)
Occupational Therapy Evaluation Patient Details Name: Catherine Terrell MRN: [id number] DOB: [birth year] Today's Date: 06/12/2021    History of Present Illness 87 yo female adm after fall resulting in L IT hip fx. PMH: bil TKAs (done in [place]), dementia   Clinical Impression   Unable to obtain history from patient due to history of dementia, prefers language Hindi and has intermittent difficulty understanding phone interpreter with hearing aids. Patient repeatedly stating "I can't sit up by myself" needing reassurance that therapy is there to help. Utilize bed pad to transition patient to sitting edge of bed +2 assist. Patient able to sit upright at edge of bed with fair sitting balance. With +2 hand held assist patient able to stand pivot to recliner chair and set up for lunch. Acute OT to follow to progress mobility and minimize caregiver burden in order to facilitate D/C to venue listed below.     Follow Up Recommendations  Other (comment) (SNF vs HH; no [place] insurance)    Equipment Recommendations  3 in 1 bedside commode       Precautions / Restrictions Precautions Precautions: Fall Restrictions Weight Bearing Restrictions: Yes LLE Weight Bearing: Partial weight bearing LLE Partial Weight Bearing Percentage or Pounds: 50%\      Mobility Bed Mobility Overal bed mobility: Needs Assistance Bed Mobility: Supine to Sit     Supine to sit: Total assist;+2 for physical assistance;+2 for safety/equipment     General bed mobility comments: patient not following cues to initiate bed mobility telling interpreter she can't do it on her own therefore utilize bed pad to transition patient to edge of bed    Transfers Overall transfer level: Needs assistance Equipment used: 2 person hand held assist Transfers: Stand Pivot Transfers   Stand pivot transfers: Mod assist;+2 physical assistance;+2 safety/equipment            Balance Overall balance assessment: Needs  assistance Sitting-balance support: Feet supported Sitting balance-Leahy Scale: Fair     Standing balance support: Bilateral upper extremity supported Standing balance-Leahy Scale: Poor Standing balance comment: poor, pt heavily reliant on UEs and external assist                           ADL either performed or assessed with clinical judgement   ADL Overall ADL's : Needs assistance/impaired Eating/Feeding: Set up;Sitting Eating/Feeding Details (indicate cue type and reason): eating banana at end of session Grooming: Set up;Sitting   Upper Body Bathing: Minimal assistance;Sitting   Lower Body Bathing: Maximal assistance;Bed level;Sitting/lateral leans   Upper Body Dressing : Sitting;Minimal assistance   Lower Body Dressing: Total assistance;Bed level;Sitting/lateral leans   Toilet Transfer: Moderate assistance;+2 for physical assistance;+2 for safety/equipment Toilet Transfer Details (indicate cue type and reason): stand pivot to recliner chair with x2 hand held assistance Toileting- Clothing Manipulation and Hygiene: Total assistance       Functional mobility during ADLs: Moderate assistance;+2 for safety/equipment;+2 for physical assistance General ADL Comments: difficulty with directions due to language barrier despite interpreter keeps repeating "I can't sit up myself" providing reassurance therapy there to assist      Pertinent Vitals/Pain Pain Assessment: Faces Faces Pain Scale: Hurts a little bit Pain Location: L hip/thigh Pain Descriptors / Indicators: Grimacing Pain Intervention(s): Monitored during session     Hand Dominance  (unable to specify)   Extremity/Trunk Assessment Upper Extremity Assessment Upper Extremity Assessment: Difficult to assess due to impaired cognition;Generalized weakness   Lower Extremity Assessment Lower Extremity  Assessment: Defer to PT evaluation       Communication Communication Communication: Prefers language  other than English;HOH   Cognition Arousal/Alertness: Awake/alert Behavior During Therapy: [facility] for tasks assessed/performed;Anxious Overall Cognitive Status: Difficult to assess                                 General Comments: per chart review patient has history of dementia, difficulty hearing intrepreter at times even with hearing aids in              Home Living Family/patient expects to be discharged to:: Other (Comment) Living Arrangements: Children                               Additional Comments: son      Prior Functioning/Environment          Comments: unable to determine from pt, no family present. appears she was ambulatory at baseline. utilized phone interpreter and patient hearing aids however intermittent difficulty hearing interpreter        OT Problem List: Pain;Decreased strength;Decreased activity tolerance;Impaired balance (sitting and/or standing);Decreased safety awareness      OT Treatment/Interventions: Self-care/ADL training;Balance training;Patient/family education;Therapeutic activities    OT Goals(Current goals can be found in the care plan section) Acute Rehab OT Goals Patient Stated Goal: "I want warm milk" OT Goal Formulation: With patient Time For Goal Achievement: 06/26/21 Potential to Achieve Goals: Fair  OT Frequency: Min 2X/week    AM-PAC OT "6 Clicks" Daily Activity     Outcome Measure Help from another person eating meals?: A Little Help from another person taking care of personal grooming?: A Little Help from another person toileting, which includes using toliet, bedpan, or urinal?: Total Help from another person bathing (including washing, rinsing, drying)?: A Lot Help from another person to put on and taking off regular upper body clothing?: A Little Help from another person to put on and taking off regular lower body clothing?: Total 6 Click Score: 13   End of Session Nurse Communication:  Mobility status  Activity Tolerance: Patient tolerated treatment well Patient left: in chair;with call bell/phone within reach;with chair alarm set  OT Visit Diagnosis: History of falling (Z91.81);Other abnormalities of gait and mobility (R26.89);Unsteadiness on feet (R26.81)                Time: 9485-4627 OT Time Calculation (min): 20 min Charges:  OT General Charges $OT Visit: 1 Visit OT Evaluation $OT Eval Moderate Complexity: 1 Mod  Marlyce Huge OT OT pager: [phone]  Carmelia Roller 06/12/2021, 2:18 PM

## 2021-06-12 NOTE — Progress Notes (Signed)
PROGRESS NOTE    Catherine Terrell  KKX:[id number] DOB: January 23, 1933 DOA: 05/31/2021 PCP: System, Provider Not In    Brief Narrative:  Catherine Terrell was admitted to the hospital with a working diagnosis of left hip fracture.  87 year old female past medical history for interstitial lung disease, hypothyroidism, and dyslipidemia who presented after mechanical fall.  Apparently she lost her balance while trying to stand up to go to the bathroom.  She will fell and landed on her left side.  Positive head trauma but no loss of consciousness.  After the fall she had severe pain at her left hip and she was brought to the hospital for further evaluation.  On her initial physical examination her blood pressure was 180/71, heart rate 64, respiratory rate 18, temperature 98.5, oxygen saturation 99% patient was only oriented to self, lungs are clear to auscultation, heart S1-S2, present, rhythmic, soft abdomen, no lower extremity edema.  Left leg was shortened and externally rotated.  Pelvic radiograph with comminuted impacted intertrochanteric left hip fracture with varus angulation.   Patient was seen by orthopedics and underwent open reduction internal fixation, nail intratrochanteric, 8/28.  Postoperatively patient developed encephalopathy, confusion and delirium. Patient very weak and deconditioned.  Requires high of nursing care. Difficult placement due to lack of insurance in [place].  Assessment & Plan:   Principal Problem:   Closed left hip fracture (HCC) Active Problems:   Interstitial lung disease (HCC)   Hypothyroidism   MCI (mild cognitive impairment)   Left hip fracture. Patient is sp ORIF.  Continue pain control and DVT prophylaxis.  Pending placement at SNF.   2. ILD. Has been stable with no signs of exacerbation, continue with pirfenidone Continue oxymetry monitoring.   3. Hypothyroid. Continue with levothyroxine  4. Acute metabolic encephalopathy/ mild cognitive  impairment. No agitation. Positive language barrier. Continue with namenda and aricept.   5. Acute post operative anemia. Blood loss anemia. Sp 2 units PRBC. Her cell count has been stable. Likely she has chronic anemia as well.   6. Urinary retention. Initially required in and out catheterization.  Now with no signs of further urinary retention.   7. Dyslipidemia. Continue with atorvastatin.   8. NEW hypotension.  Blood pressure 86/50 with HR 123, patient with very poor oral intake Will add 250 cc bolus and will continue to follow.   Status is: Inpatient  Remains inpatient appropriate because:Inpatient level of care appropriate due to severity of illness  Dispo: The patient is from: Home              Anticipated d/c is to: SNF              Patient currently is medically stable to d/c.   Difficult to place patient No  DVT prophylaxis: Enoxaparin    Code Status:     full Family Communication:  No family at the bedside      Consultants:  Orthopedics   Procedures:   ORIF       Subjective: Patient with no nausea or vomiting, no apparent pain or dyspnea.   Objective: Vitals:   06/11/21 1344 06/11/21 2107 06/12/21 0545 06/12/21 1352  BP: 122/74 (!) 138/58 (!) 148/78 (!) 86/50  Pulse: 96 80 74 (!) 123  Resp: 18 16 16 18   Temp: 98.6 F (37 C) 98.9 F (37.2 C) 97.8 F (36.6 C) 97.8 F (36.6 C)  TempSrc: Oral Oral Oral Oral  SpO2: 98% 99% 97% 98%  Weight:  Height:        Intake/Output Summary (Last 24 hours) at 06/12/2021 1359 Last data filed at 06/12/2021 0600 Gross per 24 hour  Intake 160 ml  Output 0 ml  Net 160 ml   Filed Weights   05/31/21 2309 06/02/21 0233  Weight: 46.8 kg 46.8 kg    Examination:   General: Not in pain or dyspnea,  Neurology: Awake and alert, non focal  E ENT: no pallor, no icterus, oral mucosa moist Cardiovascular: No JVD. S1-S2 present, rhythmic, no gallops, rubs, or murmurs. No lower extremity edema. Pulmonary: positive  breath sounds bilaterally, adequate air movement, no wheezing, rhonchi or rales. Gastrointestinal. Abdomen soft and non tender Skin. No rashes Musculoskeletal: no joint deformities     Data Reviewed: I have personally reviewed following labs and imaging studies  CBC: No results for input(s): WBC, NEUTROABS, HGB, HCT, MCV, PLT in the last 168 hours. Basic Metabolic Panel: No results for input(s): NA, K, CL, CO2, GLUCOSE, BUN, CREATININE, CALCIUM, MG, PHOS in the last 168 hours. GFR: Estimated Creatinine Clearance: 32.3 mL/min (by C-G formula based on SCr of 0.5 mg/dL). Liver Function Tests: No results for input(s): AST, ALT, ALKPHOS, BILITOT, PROT, ALBUMIN in the last 168 hours. No results for input(s): LIPASE, AMYLASE in the last 168 hours. No results for input(s): AMMONIA in the last 168 hours. Coagulation Profile: No results for input(s): INR, PROTIME in the last 168 hours. Cardiac Enzymes: No results for input(s): CKTOTAL, CKMB, CKMBINDEX, TROPONINI in the last 168 hours. BNP (last 3 results) No results for input(s): PROBNP in the last 8760 hours. HbA1C: No results for input(s): HGBA1C in the last 72 hours. CBG: No results for input(s): GLUCAP in the last 168 hours. Lipid Profile: No results for input(s): CHOL, HDL, LDLCALC, TRIG, CHOLHDL, LDLDIRECT in the last 72 hours. Thyroid Function Tests: No results for input(s): TSH, T4TOTAL, FREET4, T3FREE, THYROIDAB in the last 72 hours. Anemia Panel: No results for input(s): VITAMINB12, FOLATE, FERRITIN, TIBC, IRON, RETICCTPCT in the last 72 hours.    Radiology Studies: I have reviewed all of the imaging during this hospital visit personally     Scheduled Meds:  aspirin  81 mg Oral BID   atorvastatin  10 mg Oral Daily   donepezil  5 mg Oral QHS   levothyroxine  100 mcg Oral Once per day on Sun Fri Sat   levothyroxine  50 mcg Oral Once per day on Mon Tue Wed Thu   memantine  5 mg Oral Daily   Pirfenidone  801 mg Oral  TID   tamsulosin  0.4 mg Oral Daily   Continuous Infusions:  methocarbamol (ROBAXIN) IV       LOS: 12 days        [name] Annett Gula, MD

## 2021-06-12 NOTE — [place] FL2 (Signed)
[place] MEDICAID FL2 LEVEL OF CARE SCREENING TOOL     IDENTIFICATION  Patient Name: Catherine Terrell Birthdate: 1933-01-27 Sex: female Admission Date (Current Location): 05/31/2021  [facility] and [place] Number:  Producer, television/film/video and Address:  [facility],  [address] [place], [place] [address]      Provider Number: [id number]  Attending Physician Name and Address:  Coralie Keens  Relative Name and Phone Number:  son, Jacquiline Zurcher [phone]    Current Level of Care: Hospital Recommended Level of Care: Skilled Nursing Facility Prior Approval Number:    Date Approved/Denied:   PASRR Number:    Discharge Plan: SNF    Current Diagnoses: Patient Active Problem List   Diagnosis Date Noted   Closed left hip fracture (HCC) 05/31/2021   Interstitial lung disease (HCC) 05/31/2021   Hypothyroidism 05/31/2021   MCI (mild cognitive impairment) 05/31/2021    Orientation RESPIRATION BLADDER Height & Weight     Self, Situation  Normal Incontinent Weight: 103 lb 2.8 oz (46.8 kg) Height:  4\' 8"  (142.2 cm)  BEHAVIORAL SYMPTOMS/MOOD NEUROLOGICAL BOWEL NUTRITION STATUS      Incontinent    AMBULATORY STATUS COMMUNICATION OF NEEDS Skin   Limited Assist Verbally Surgical wounds                       Personal Care Assistance Level of Assistance  Bathing, Dressing Bathing Assistance: Limited assistance   Dressing Assistance: Limited assistance     Functional Limitations Info             SPECIAL CARE FACTORS FREQUENCY  PT (By licensed PT), OT (By licensed OT)     PT Frequency: 5x/wk OT Frequency: 5x/wk            Contractures Contractures Info: Not present    Additional Factors Info  Code Status, Psychotropic Code Status Info: Full   Psychotropic Info: see MAR         Current Medications (06/12/2021):  This is the current hospital active medication list Current Facility-Administered Medications  Medication Dose Route Frequency  Provider Last Rate Last Admin   acetaminophen (TYLENOL) tablet 325-650 mg  325-650 mg Oral Q6H PRN 08/12/2021, PA-C   650 mg at 06/11/21 1758   aspirin chewable tablet 81 mg  81 mg Oral BID 08/11/21, PA-C   81 mg at 06/12/21 1042   atorvastatin (LIPITOR) tablet 10 mg  10 mg Oral Daily 08/12/21, PA-C   10 mg at 06/12/21 1041   donepezil (ARICEPT) tablet 5 mg  5 mg Oral QHS 08/12/21, PA-C   5 mg at 06/11/21 2106   hydrALAZINE (APRESOLINE) injection 5 mg  5 mg Intravenous Q8H PRN 2107, PA-C       HYDROcodone-acetaminophen (NORCO) 7.5-325 MG per tablet 1-2 tablet  1-2 tablet Oral Q4H PRN 02-03-1977, PA-C   1 tablet at 06/05/21 2154   HYDROcodone-acetaminophen (NORCO/VICODIN) 5-325 MG per tablet 1-2 tablet  1-2 tablet Oral Q4H PRN 2155, PA-C   1 tablet at 06/10/21 2145   labetalol (NORMODYNE) injection 5 mg  5 mg Intravenous Q2H PRN 2146, MD   5 mg at 06/02/21 1747   levothyroxine (SYNTHROID) tablet 100 mcg  100 mcg Oral Once per day on Sun Fri Sat 06[birth year]-1974, PA-C   100 mcg at 06/09/21 08/09/21   levothyroxine (SYNTHROID) tablet 50 mcg  50 mcg Oral Once  per day on Mon Tue Wed Thu Cassandria Anger, PA-C   50 mcg at 06/12/21 0545   memantine (NAMENDA) tablet 5 mg  5 mg Oral Daily Cassandria Anger, PA-C   5 mg at 06/12/21 1041   menthol-cetylpyridinium (CEPACOL) lozenge 3 mg  1 lozenge Oral PRN Cassandria Anger, PA-C       Or   phenol (CHLORASEPTIC) mouth spray 1 spray  1 spray Mouth/Throat PRN Cassandria Anger, PA-C       methocarbamol (ROBAXIN) tablet 500 mg  500 mg Oral Q6H PRN Rosalene Billings R, PA-C   500 mg at 06/12/21 1041   Or   methocarbamol (ROBAXIN) 500 mg in dextrose 5 % 50 mL IVPB  500 mg Intravenous Q6H PRN Cassandria Anger, PA-C       metoCLOPramide (REGLAN) tablet 5-10 mg  5-10 mg Oral Q8H PRN Cassandria Anger, PA-C       Or   metoCLOPramide (REGLAN) injection 5-10 mg  5-10 mg Intravenous Q8H PRN  Cassandria Anger, PA-C       morphine 2 MG/ML injection 0.5-1 mg  0.5-1 mg Intravenous Q2H PRN Cassandria Anger, PA-C       ondansetron [facility]) tablet 4 mg  4 mg Oral Q6H PRN Cassandria Anger, PA-C       Or   ondansetron [facility]) injection 4 mg  4 mg Intravenous Q6H PRN Cassandria Anger, PA-C       Pirfenidone TABS 801 mg  801 mg Oral TID Cassandria Anger, PA-C   801 mg at 06/11/21 2106   Suvorexant TABS 5 mg  5 mg Oral QHS PRN Cassandria Anger, PA-C       tamsulosin [facility]) capsule 0.4 mg  0.4 mg Oral Daily Jerald Kief, MD   0.4 mg at 06/12/21 1042     Discharge Medications: Please see discharge summary for a list of discharge medications.  Relevant Imaging Results:  Relevant Lab Results:   Additional Information ss# [id number]  Amada Jupiter, LCSW

## 2021-06-12 NOTE — Progress Notes (Signed)
Physical Therapy Treatment Patient Details Name: Catherine Terrell MRN: [id number] DOB: [birth year] Today's Date: 06/12/2021    History of Present Illness 87 yo female adm after fall resulting in L IT hip fx. PMH: bil TKAs (done in [place]), dementia    PT Comments    Patient is making slow but steady progress with acute therapies. She was able to complete rise from EOB and [facility] with Mod Assist +1 today after improved use of UE's for power up. Pt with posterior lean initially with rise and manual facilitation of anterior weight shift required to improve balance prior to stepping bed<>BSC. Repeated sit<>stands completed at EOB for LE strengthening and motor education to improve recall on safe transfer technique. EOS pt sitting EOB pt's son at bedside and pt eating dinner. RN/NT aware. Acute PT will continue to progress pt as able.    Follow Up Recommendations   (SNF vs HH, no ins in [place])     Equipment Recommendations  Rolling walker with 5" wheels;Wheelchair (measurements PT);Wheelchair cushion (measurements PT);Hospital bed;3in1 (PT)    Recommendations for Other Services       Precautions / Restrictions Precautions Precautions: Fall Restrictions Weight Bearing Restrictions: No LLE Weight Bearing: Partial weight bearing LLE Partial Weight Bearing Percentage or Pounds: 50%    Mobility  Bed Mobility Overal bed mobility: Needs Assistance Bed Mobility: Supine to Sit     Supine to sit: Mod assist;HOB elevated;Max assist     General bed mobility comments: pt requried multimodal cues to initaite reaching for bed rail and moving LE's off EOB. Max assist to bring LE over to EOB and repeated assis/cue to keep hands on be rail. Mod assist to fully raise trunk once pt began to initiate and pull up. pt was frustrated with son interpretting and this limited her initiating mobility.    Transfers Overall transfer level: Needs assistance Equipment used: 2 person hand held assist Transfers: Stand  Pivot Transfers Sit to Stand: Mod assist;+2 safety/equipment Stand pivot transfers: Mod assist;+2 safety/equipment;Min assist       General transfer comment: Pt required +2 mod assist for initial rise from EOB and Mod assist to manage RW and step to [facility]. Visual/tactile cues useful for hand placement to power up with Bil UE from [facility]. Pt rose with Mod Assist of 1 and then Mod assist to take small steps back to bed. Patient completed 4 stands from EOB with Mod +1 assist and repeated tactile/visual cues from therapist and intermittent verbal cues from son. EOS pt sitting EOB pt's son at bedside and pt eating dinner. RN/NT aware.  Ambulation/Gait                 Stairs             Wheelchair Mobility    Modified Rankin (Stroke Patients Only)       Balance Overall balance assessment: Needs assistance Sitting-balance support: Feet supported Sitting balance-Leahy Scale: Fair     Standing balance support: Bilateral upper extremity supported Standing balance-Leahy Scale: Poor Standing balance comment: poor, pt heavily reliant on UEs and external assist                            Cognition Arousal/Alertness: Awake/alert Behavior During Therapy: WFL for tasks assessed/performed;Anxious Overall Cognitive Status: Difficult to assess  General Comments: per chart review patient has history of dementia, difficulty hearing intrepreter at times even with hearing aids in      Exercises      General Comments        Pertinent Vitals/Pain Pain Assessment: Faces Faces Pain Scale: Hurts a little bit Pain Location: Lt hip/thigh Pain Descriptors / Indicators: Grimacing Pain Intervention(s): Monitored during session;Limited activity within patient's tolerance;Ice applied;Repositioned    Home Living Family/patient expects to be discharged to:: Other (Comment) Living Arrangements: Children             Additional  Comments: son    Prior Function        Comments: unable to determine from pt, no family present. appears she was ambulatory at baseline. utilized phone interpreter and patient hearing aids however intermittent difficulty hearing interpreter   PT Goals (current goals can now be found in the care plan section) Acute Rehab PT Goals Patient Stated Goal: "I want to eat"..."I need the bathroom" via son interpreting PT Goal Formulation: Patient unable to participate in goal setting Time For Goal Achievement: 06/26/21 (extended by 2 weeks) Potential to Achieve Goals: Fair Progress towards PT goals: Progressing toward goals (slowly but progressing)    Frequency    Min 4X/week      PT Plan Current plan remains appropriate    Co-evaluation              AM-PAC PT "6 Clicks" Mobility   Outcome Measure  Help needed turning from your back to your side while in a flat bed without using bedrails?: A Lot Help needed moving from lying on your back to sitting on the side of a flat bed without using bedrails?: A Lot Help needed moving to and from a bed to a chair (including a wheelchair)?: A Lot Help needed standing up from a chair using your arms (e.g., wheelchair or bedside chair)?: A Lot Help needed to walk in hospital room?: A Lot Help needed climbing 3-5 steps with a railing? : A Lot 6 Click Score: 12    End of Session Equipment Utilized During Treatment: Gait belt Activity Tolerance: Patient tolerated treatment well Patient left: in bed;with call bell/phone within reach;with family/visitor present Nurse Communication: Mobility status PT Visit Diagnosis: Other abnormalities of gait and mobility (R26.89)     Time: 6283-6629 PT Time Calculation (min) (ACUTE ONLY): 29 min  Charges:  $Therapeutic Activity: 23-37 mins                     Wynn Maudlin, DPT Acute Rehabilitation Services Office [phone] Pager [phone]    Catherine Terrell 06/12/2021, 7:45 PM

## 2021-06-13 DIAGNOSIS — J849 Interstitial pulmonary disease, unspecified: Secondary | ICD-10-CM | POA: Diagnosis not present

## 2021-06-13 DIAGNOSIS — E039 Hypothyroidism, unspecified: Secondary | ICD-10-CM | POA: Diagnosis not present

## 2021-06-13 DIAGNOSIS — G3184 Mild cognitive impairment, so stated: Secondary | ICD-10-CM

## 2021-06-13 DIAGNOSIS — S72002A Fracture of unspecified part of neck of left femur, initial encounter for closed fracture: Secondary | ICD-10-CM | POA: Diagnosis not present

## 2021-06-13 NOTE — Progress Notes (Signed)
Patient called chaplain into room from hallway because she wanted her bedside table cleared.  She was very appreciative and was asking for something that chaplain did not understand.  Chaplain used the language line provided in the room, but patient was not able to communicate what she was asking.  Chaplain provided ministry of presence and alerted her bedside nurse that she may be in need of something.  Chaplain Dyanne Carrel, Bcc Pager, [phone] 6:33 PM

## 2021-06-13 NOTE — Progress Notes (Signed)
PROGRESS NOTE    Catherine Terrell  SEG:[id number] DOB: [birth year] DOA: 05/31/2021 PCP: System, Provider Not In    Brief Narrative:  Mrs. Wentland was admitted to the hospital with a working diagnosis of left hip fracture.   87 year old female past medical history for interstitial lung disease, hypothyroidism, and dyslipidemia who presented after mechanical fall.  Apparently she lost her balance while trying to stand up to go to the bathroom.  She will fell and landed on her left side.  Positive head trauma but no loss of consciousness.  After the fall she had severe pain at her left hip and she was brought to the hospital for further evaluation.  On her initial physical examination her blood pressure was 180/71, heart rate 64, respiratory rate 18, temperature 98.5, oxygen saturation 99% patient was only oriented to self, lungs are clear to auscultation, heart S1-S2, present, rhythmic, soft abdomen, no lower extremity edema.  Left leg was shortened and externally rotated.   Pelvic radiograph with comminuted impacted intertrochanteric left hip fracture with varus angulation.    Patient was seen by orthopedics and underwent open reduction internal fixation, nail intratrochanteric, 8/28.   Postoperatively patient developed encephalopathy, confusion and delirium. Patient very weak and deconditioned.  Requires high of nursing care. Difficult placement due to lack of insurance in [place].   Assessment & Plan:   Principal Problem:   Closed left hip fracture (HCC) Active Problems:   Interstitial lung disease (HCC)   Hypothyroidism   MCI (mild cognitive impairment)   Left hip fracture. Patient is sp ORIF.  Pain control and DVT prophylaxis.  Difficult placement, no insurance cover and her family can not take her back home, because lack of supervision.    2. ILD. Continue with pirfenidone. No exacerbation No dyspnea,    3. Hypothyroid. On levothyroxine   4. Acute metabolic  encephalopathy/ mild cognitive impairment.  Positive language barrier. Tolerating well namenda and aricept.    5. Acute post operative anemia. Blood loss anemia. Sp 2 units PRBC. Follow cell count as outpatient.    6. Urinary retention. Initially required in and out catheterization.  Now with no signs of further urinary retention.  Currently with no cathter.    7. Dyslipidemia. On atorvastatin.    8. NEW hypotension.  blood pressure has improved after IV fluids bolus, continue close monitoring. Patient with poor oral intake.   Status is: Inpatient  Remains inpatient appropriate because:Inpatient level of care appropriate due to severity of illness  Dispo: The patient is from: Home              Anticipated d/c is to: SNF              Patient currently is not medically stable to d/c.   Difficult to place patient No   DVT prophylaxis: Enoxaparin   Code Status:    full  Family Communication:   No family at the bedside       Consultants:  Orthopedics   Procedures:  ORIF left hip    Subjective: Patient with no nausea or vomiting, no chest pain or dyspnea.   Objective: Vitals:   06/12/21 2101 06/13/21 0143 06/13/21 0517 06/13/21 1023  BP: (!) 146/61 (!) 145/73 (!) 152/77 138/73  Pulse: 84 79 74 75  Resp: 16 16 16 20   Temp: 98 F (36.7 C) 98.5 F (36.9 C) 97.8 F (36.6 C) 97.6 F (36.4 C)  TempSrc: Oral Oral Oral Oral  SpO2: 99% 98% 98% 98%  Weight:      Height:        Intake/Output Summary (Last 24 hours) at 06/13/2021 1411 Last data filed at 06/12/2021 1601 Gross per 24 hour  Intake 8.33 ml  Output --  Net 8.33 ml   Filed Weights   05/31/21 2309 06/02/21 0233  Weight: 46.8 kg 46.8 kg    Examination:   General: Not in pain or dyspnea, .deconditioned  Neurology: Awake and alert, non focal  E ENT: no pallor, no icterus, oral mucosa moist Cardiovascular: No JVD. S1-S2 present, rhythmic, no gallops, rubs, or murmurs. No lower extremity edema. Pulmonary:  positive breath sounds bilaterally, adequate air movement, no wheezing, rhonchi or rales. Gastrointestinal. Abdomen soft and non tender Skin. No rashes Musculoskeletal: no joint deformities     Data Reviewed: I have personally reviewed following labs and imaging studies  CBC: No results for input(s): WBC, NEUTROABS, HGB, HCT, MCV, PLT in the last 168 hours. Basic Metabolic Panel: No results for input(s): NA, K, CL, CO2, GLUCOSE, BUN, CREATININE, CALCIUM, MG, PHOS in the last 168 hours. GFR: Estimated Creatinine Clearance: 32.3 mL/min (by C-G formula based on SCr of 0.5 mg/dL). Liver Function Tests: No results for input(s): AST, ALT, ALKPHOS, BILITOT, PROT, ALBUMIN in the last 168 hours. No results for input(s): LIPASE, AMYLASE in the last 168 hours. No results for input(s): AMMONIA in the last 168 hours. Coagulation Profile: No results for input(s): INR, PROTIME in the last 168 hours. Cardiac Enzymes: No results for input(s): CKTOTAL, CKMB, CKMBINDEX, TROPONINI in the last 168 hours. BNP (last 3 results) No results for input(s): PROBNP in the last 8760 hours. HbA1C: No results for input(s): HGBA1C in the last 72 hours. CBG: No results for input(s): GLUCAP in the last 168 hours. Lipid Profile: No results for input(s): CHOL, HDL, LDLCALC, TRIG, CHOLHDL, LDLDIRECT in the last 72 hours. Thyroid Function Tests: No results for input(s): TSH, T4TOTAL, FREET4, T3FREE, THYROIDAB in the last 72 hours. Anemia Panel: No results for input(s): VITAMINB12, FOLATE, FERRITIN, TIBC, IRON, RETICCTPCT in the last 72 hours.    Radiology Studies: I have reviewed all of the imaging during this hospital visit personally     Scheduled Meds:  aspirin  81 mg Oral BID   atorvastatin  10 mg Oral Daily   donepezil  5 mg Oral QHS   levothyroxine  100 mcg Oral Once per day on Sun Fri Sat   levothyroxine  50 mcg Oral Once per day on Mon Tue Wed Thu   memantine  5 mg Oral Daily   Pirfenidone  801  mg Oral TID   tamsulosin  0.4 mg Oral Daily   Continuous Infusions:  methocarbamol (ROBAXIN) IV       LOS: 13 days        Tramar Brueckner Annett Gula, MD

## 2021-06-14 DIAGNOSIS — G3184 Mild cognitive impairment, so stated: Secondary | ICD-10-CM | POA: Diagnosis not present

## 2021-06-14 DIAGNOSIS — E039 Hypothyroidism, unspecified: Secondary | ICD-10-CM | POA: Diagnosis not present

## 2021-06-14 DIAGNOSIS — S72002A Fracture of unspecified part of neck of left femur, initial encounter for closed fracture: Secondary | ICD-10-CM | POA: Diagnosis not present

## 2021-06-14 DIAGNOSIS — J849 Interstitial pulmonary disease, unspecified: Secondary | ICD-10-CM | POA: Diagnosis not present

## 2021-06-14 NOTE — Progress Notes (Signed)
Physical Therapy Treatment Patient Details Name: Catherine Terrell MRN: [id number] DOB: 1933/03/03 Today's Date: 06/14/2021    History of Present Illness 87 yo female adm after fall resulting in L IT hip fx. PMH: bil TKAs (done in [place]), dementia    PT Comments    Patient making good progress with acute PT. Ambulated 2 short bouts of ~10, 20 ' with RW and Mod assist. Pt is demonstrated improved initiation for supine<>sit and sit<>stand transfers. Pt able to recall safe hand placement for power up ~60% of the time. Will continue to progress as able.     Follow Up Recommendations   (SNF vs HH, no ins in [place])     Equipment Recommendations  Rolling walker with 5" wheels;Wheelchair (measurements PT);Wheelchair cushion (measurements PT);Hospital bed;3in1 (PT)    Recommendations for Other Services       Precautions / Restrictions Precautions Precautions: Fall Restrictions LLE Weight Bearing: Partial weight bearing LLE Partial Weight Bearing Percentage or Pounds: 50%    Mobility  Bed Mobility Overal bed mobility: Needs Assistance Bed Mobility: Supine to Sit     Supine to sit: Mod assist;HOB elevated     General bed mobility comments: pt with increased initiation to reach for bed rail and to walk bil LE's off EOB. Mod assist with bed pad to fully scoot to edge.    Transfers Overall transfer level: Needs assistance Equipment used: 2 person hand held assist Transfers: Stand Pivot Transfers Sit to Stand: Mod assist Stand pivot transfers: Mod assist       General transfer comment: Mod +2 to rise from EOB, pt with good recall for hand placement with power up ~60% of time. Rise form EOB and mod assist to manage walker with steps to [facility]. pt unable to void. Pt unsafe with return to sit on recliner and tends to plop and sit before she is all the way in front of the chair.  Ambulation/Gait Ambulation/Gait assistance: Mod assist Gait Distance (Feet): 30 Feet (10,20) Assistive device:  Rolling walker (2 wheeled) Gait Pattern/deviations: Step-to pattern;Trunk flexed;Narrow base of support Gait velocity: decr   General Gait Details: Mod assist to manage RW proximity especially with turning as pt tends to move walker too far ahead. Pt ambualted short bout and then longer bout in room. trunk became more flexed as she fatigued.   Stairs             Wheelchair Mobility    Modified Rankin (Stroke Patients Only)       Balance Overall balance assessment: Needs assistance Sitting-balance support: Feet supported Sitting balance-Leahy Scale: Fair     Standing balance support: Bilateral upper extremity supported Standing balance-Leahy Scale: Poor Standing balance comment: poor, pt heavily reliant on UEs and external assist                            Cognition Arousal/Alertness: Awake/alert Behavior During Therapy: WFL for tasks assessed/performed;Anxious Overall Cognitive Status: Difficult to assess                                 General Comments: per chart review patient has history of dementia, difficulty hearing intrepreter at times even with hearing aids in      Exercises Other Exercises Other Exercises: 5x sit<>stand from recliner, min assist and manual assist to improve Lt LE position for rise. pt using bil UE on armrests for  power up.    General Comments        Pertinent Vitals/Pain Pain Assessment: Faces Faces Pain Scale: Hurts little more Pain Location: Lt hip/thigh Pain Descriptors / Indicators: Grimacing Pain Intervention(s): Limited activity within patient's tolerance;Monitored during session;Repositioned    Home Living                      Prior Function            PT Goals (current goals can now be found in the care plan section) Acute Rehab PT Goals Patient Stated Goal: "I want to eat"..."I need the bathroom" via son interpreting PT Goal Formulation: Patient unable to participate in goal  setting Time For Goal Achievement: 06/26/21 (extended by 2 weeks) Potential to Achieve Goals: Fair Progress towards PT goals: Progressing toward goals    Frequency    Min 4X/week      PT Plan Current plan remains appropriate    Co-evaluation              AM-PAC PT "6 Clicks" Mobility   Outcome Measure  Help needed turning from your back to your side while in a flat bed without using bedrails?: A Lot Help needed moving from lying on your back to sitting on the side of a flat bed without using bedrails?: A Lot Help needed moving to and from a bed to a chair (including a wheelchair)?: A Lot Help needed standing up from a chair using your arms (e.g., wheelchair or bedside chair)?: A Lot Help needed to walk in hospital room?: A Lot Help needed climbing 3-5 steps with a railing? : A Lot 6 Click Score: 12    End of Session Equipment Utilized During Treatment: Gait belt Activity Tolerance: Patient tolerated treatment well Patient left: in bed;with call bell/phone within reach;with family/visitor present Nurse Communication: Mobility status PT Visit Diagnosis: Other abnormalities of gait and mobility (R26.89)     Time: 1840-1905 PT Time Calculation (min) (ACUTE ONLY): 25 min  Charges:  $Gait Training: 8-22 mins $Therapeutic Exercise: 8-22 mins                     Wynn Maudlin, DPT Acute Rehabilitation Services Office [phone] Pager [phone]    Anitra Lauth 06/14/2021, 7:17 PM

## 2021-06-14 NOTE — Progress Notes (Signed)
PROGRESS NOTE    Catherine Terrell  LOV:[id number] DOB: 28-Aug-1933 DOA: 05/31/2021 PCP: System, Provider Not In    Brief Narrative:  Catherine Terrell was admitted to the hospital with a working diagnosis of left hip fracture.   87 year old female past medical history for interstitial lung disease, hypothyroidism, and dyslipidemia who presented after mechanical fall.  Apparently she lost her balance while trying to stand up to go to the bathroom.  She will fell and landed on her left side.  Positive head trauma but no loss of consciousness.  After the fall she had severe pain at her left hip and she was brought to the hospital for further evaluation.  On her initial physical examination her blood pressure was 180/71, heart rate 64, respiratory rate 18, temperature 98.5, oxygen saturation 99% patient was only oriented to self, lungs are clear to auscultation, heart S1-S2, present, rhythmic, soft abdomen, no lower extremity edema.  Left leg was shortened and externally rotated.   Pelvic radiograph with comminuted impacted intertrochanteric left hip fracture with varus angulation.    Patient was seen by orthopedics and underwent open reduction internal fixation, nail intratrochanteric, 8/28.   Postoperatively patient developed encephalopathy, confusion and delirium. Patient very weak and deconditioned.  Requires high of nursing care. Difficult placement due to lack of insurance in [place].   Assessment & Plan:   Principal Problem:   Closed left hip fracture (HCC) Active Problems:   Interstitial lung disease (HCC)   Hypothyroidism   MCI (mild cognitive impairment)   Left hip fracture. Patient is sp ORIF.  Difficult placement, no insurance cover and her family can not take her back home, because lack of supervision.   Continue to work with physical therapy, needs to improve her strength and balance in order to be discharged home.    2. ILD. On pirfenidone. No clinical signs of  exacerbation  3. Hypothyroid. Continue with levothyroxine   4. Acute metabolic encephalopathy/ mild cognitive impairment.  Continue with namenda and aricept.    5. Acute post operative anemia. Blood loss anemia. Sp 2 units PRBC..    6. Urinary retention. Initially required in and out catheterization.  Now with no signs of further urinary retention.  Currently with no cathter.    7. Dyslipidemia. Continue with atorvastatin.    8. NEW hypotension.  clinically resolved.   Status is: Inpatient  Remains inpatient appropriate because:Unsafe d/c plan and    Dispo: The patient is from: Home              Anticipated d/c is to: Home              Patient currently is not medically stable to d/c.   Difficult to place patient No   DVT prophylaxis: Enoxaparin   Code Status:    full  Family Communication:  I spoke over the phone with the patient's son about patient's  condition, plan of care, prognosis and all questions were addressed.      Consultants:  Orthopedics   Procedures:   ORIF   Subjective: Patient with no nausea or vomiting, no dyspnea or chest pain, continue to be very weak and deconditioned   Objective: Vitals:   06/13/21 0517 06/13/21 1023 06/13/21 2120 06/14/21 0555  BP: (!) 152/77 138/73 (!) 162/59 (!) 150/68  Pulse: 74 75 69 65  Resp: 16 20 18 16   Temp: 97.8 F (36.6 C) 97.6 F (36.4 C) 97.8 F (36.6 C) 98.2 F (36.8 C)  TempSrc: Oral  Oral Oral Oral  SpO2: 98% 98% 97% 100%  Weight:      Height:        Intake/Output Summary (Last 24 hours) at 06/14/2021 1457 Last data filed at 06/14/2021 1000 Gross per 24 hour  Intake 420 ml  Output 800 ml  Net -380 ml   Filed Weights   05/31/21 2309 06/02/21 0233  Weight: 46.8 kg 46.8 kg    Examination:   General: Not in pain or dyspnea, deconditioned  Neurology: Awake and alert, non focal  E ENT: no pallor, no icterus, oral mucosa moist Cardiovascular: No JVD. S1-S2 present, rhythmic, no gallops, rubs, or  murmurs. No lower extremity edema. Pulmonary: vesicular breath sounds bilaterally, adequate air movement, no wheezing, rhonchi or rales. Gastrointestinal. Abdomen soft and non tender Skin. No rashes Musculoskeletal: no joint deformities   Data Reviewed: I have personally reviewed following labs and imaging studies  CBC: No results for input(s): WBC, NEUTROABS, HGB, HCT, MCV, PLT in the last 168 hours. Basic Metabolic Panel: No results for input(s): NA, K, CL, CO2, GLUCOSE, BUN, CREATININE, CALCIUM, MG, PHOS in the last 168 hours. GFR: Estimated Creatinine Clearance: 32.3 mL/min (by C-G formula based on SCr of 0.5 mg/dL). Liver Function Tests: No results for input(s): AST, ALT, ALKPHOS, BILITOT, PROT, ALBUMIN in the last 168 hours. No results for input(s): LIPASE, AMYLASE in the last 168 hours. No results for input(s): AMMONIA in the last 168 hours. Coagulation Profile: No results for input(s): INR, PROTIME in the last 168 hours. Cardiac Enzymes: No results for input(s): CKTOTAL, CKMB, CKMBINDEX, TROPONINI in the last 168 hours. BNP (last 3 results) No results for input(s): PROBNP in the last 8760 hours. HbA1C: No results for input(s): HGBA1C in the last 72 hours. CBG: No results for input(s): GLUCAP in the last 168 hours. Lipid Profile: No results for input(s): CHOL, HDL, LDLCALC, TRIG, CHOLHDL, LDLDIRECT in the last 72 hours. Thyroid Function Tests: No results for input(s): TSH, T4TOTAL, FREET4, T3FREE, THYROIDAB in the last 72 hours. Anemia Panel: No results for input(s): VITAMINB12, FOLATE, FERRITIN, TIBC, IRON, RETICCTPCT in the last 72 hours.    Radiology Studies: I have reviewed all of the imaging during this hospital visit personally     Scheduled Meds:  aspirin  81 mg Oral BID   atorvastatin  10 mg Oral Daily   donepezil  5 mg Oral QHS   levothyroxine  100 mcg Oral Once per day on Sun Fri Sat   levothyroxine  50 mcg Oral Once per day on Mon Tue Wed Thu    memantine  5 mg Oral Daily   Pirfenidone  801 mg Oral TID   tamsulosin  0.4 mg Oral Daily   Continuous Infusions:  methocarbamol (ROBAXIN) IV       LOS: 14 days        Catherine Terrell Annett Gula, MD

## 2021-06-14 NOTE — Progress Notes (Addendum)
Unable to use the interpreter Ipad to converse with the patient. Interpreter stated " Patient seems confused".  Called out to son Ameet Giel([phone])He was able to communicate patients concerns about PT, pain meds and home meds sent from pharmacy.   Bed exit alarm maintained. Phone and call light placed within reach. Rounding completed per Unit protocol and MD orders.

## 2021-06-14 NOTE — Plan of Care (Signed)
  Problem: Education: Goal: Verbalization of understanding the information provided (i.e., activity precautions, restrictions, etc) will improve Outcome: Progressing   Problem: Activity: Goal: Ability to ambulate and perform ADLs will improve Outcome: Progressing   Problem: Clinical Measurements: Goal: Postoperative complications will be avoided or minimized Outcome: Progressing   Problem: Self-Concept: Goal: Ability to maintain and perform role responsibilities to the fullest extent possible will improve Outcome: Progressing   Problem: Health Behavior/Discharge Planning: Goal: Ability to manage health-related needs will improve Outcome: Progressing   Problem: Activity: Goal: Risk for activity intolerance will decrease Outcome: Progressing   Problem: Coping: Goal: Level of anxiety will decrease Outcome: Progressing   Problem: Safety: Goal: Ability to remain free from injury will improve Outcome: Progressing   Problem: Safety: Goal: Non-violent Restraint(s) Outcome: Progressing   Problem: Safety: Goal: Non-violent Restraint(s) Outcome: Progressing

## 2021-06-14 NOTE — TOC Progression Note (Signed)
Transition of Care [facility]) - Progression Note    Patient Details  Name: Catherine Terrell MRN: [id number] Date of Birth: November 16, 1932  Transition of Care [facility]) CM/SW Contact  Amada Jupiter, LCSW Phone Number: 06/14/2021, 10:43 AM  Clinical Narrative:    Follow up phone call with son today to review options again.  One consideration is whether to have pt apply for [facility] Medicaid but this would ONLY be a plan if pt intends to remain in [place].   Son reports, however, that pt is "150% going back to [place] and we have a plane booked for October 4th."   Next possibility, pt/ son could possibly pay to purchase Part A of Medicare.  Have requested that son contact Medicare directly to determine if this is an option for his mother.  If this is an option, then we could possibly have a coverage for SNF here. Will continue to follow.   Expected Discharge Plan: Home/Self Care Barriers to Discharge: Financial Resources, Inadequate or no insurance, Continued Medical Work up  Expected Discharge Plan and Services Expected Discharge Plan: Home/Self Care In-house Referral: Clinical Social Work                                             Social Determinants of Health (SDOH) Interventions    Readmission Risk Interventions No flowsheet data found.

## 2021-06-15 DIAGNOSIS — E039 Hypothyroidism, unspecified: Secondary | ICD-10-CM | POA: Diagnosis not present

## 2021-06-15 DIAGNOSIS — J849 Interstitial pulmonary disease, unspecified: Secondary | ICD-10-CM | POA: Diagnosis not present

## 2021-06-15 DIAGNOSIS — G3184 Mild cognitive impairment, so stated: Secondary | ICD-10-CM | POA: Diagnosis not present

## 2021-06-15 DIAGNOSIS — S72002A Fracture of unspecified part of neck of left femur, initial encounter for closed fracture: Secondary | ICD-10-CM | POA: Diagnosis not present

## 2021-06-15 MED ORDER — ACETAMINOPHEN 325 MG PO TABS
650.0000 mg | ORAL_TABLET | Freq: Four times a day (QID) | ORAL | Status: DC | PRN
  Administered 2021-06-16 – 2021-07-04 (×7): 650 mg via ORAL
  Filled 2021-06-15 (×7): qty 2

## 2021-06-15 MED ORDER — HYDROCODONE-ACETAMINOPHEN 5-325 MG PO TABS
1.0000 | ORAL_TABLET | Freq: Four times a day (QID) | ORAL | Status: DC | PRN
  Administered 2021-06-15 – 2021-07-01 (×2): 1 via ORAL
  Filled 2021-06-15 (×2): qty 1

## 2021-06-15 NOTE — Progress Notes (Signed)
PT Cancellation Note  Patient Details Name: Catherine Terrell MRN: [id number] DOB: 1933-02-12   Cancelled Treatment:    Reason Eval/Treat Not Completed: Other (comment) (pt sleeping soundly, RN requested pt not be awoken as she was awake and agitated during much of the night. Will follow.)  Tamala Ser PT 06/15/2021  Acute Rehabilitation Services Pager [phone] Office [phone]

## 2021-06-15 NOTE — Progress Notes (Signed)
PROGRESS NOTE    Catherine Terrell  EXB:[id number] DOB: [birth year] DOA: 05/31/2021 PCP: System, Provider Not In    Brief Narrative:  Catherine Terrell was admitted to the hospital with a working diagnosis of left hip fracture.   87 year old female past medical history for interstitial lung disease, hypothyroidism, and dyslipidemia who presented after mechanical fall.  Apparently she lost her balance while trying to stand up to go to the bathroom.  She will fell and landed on her left side.  Positive head trauma but no loss of consciousness.  After the fall she had severe pain at her left hip and she was brought to the hospital for further evaluation.  On her initial physical examination her blood pressure was 180/71, heart rate 64, respiratory rate 18, temperature 98.5, oxygen saturation 99% patient was only oriented to self, lungs are clear to auscultation, heart S1-S2, present, rhythmic, soft abdomen, no lower extremity edema.  Left leg was shortened and externally rotated.   Pelvic radiograph with comminuted impacted intertrochanteric left hip fracture with varus angulation.    Patient was seen by orthopedics and underwent open reduction internal fixation, nail intratrochanteric, 8/28.   Postoperatively patient developed encephalopathy, confusion and delirium. Patient very weak and deconditioned.  Requires high of nursing care. Difficult placement due to lack of insurance in [place].     Assessment & Plan:   Principal Problem:   Closed left hip fracture (HCC) Active Problems:   Interstitial lung disease (HCC)   Hypothyroidism   MCI (mild cognitive impairment)     Left hip fracture. Patient is sp ORIF.  Difficult placement, no insurance cover and her family can not take her back home, because lack of supervision.    Patient very weak and deconditioned, continue nutritional support and PT/OT Her son at the bedside    2. ILD. Continue with pirfenidone.  No clinical signs of  exacerbation   3. Hypothyroid. On levothyroxine   4. Acute metabolic encephalopathy/ mild cognitive impairment.  On namenda and aricept.  No agitation.   5. Acute post operative anemia. Blood loss anemia. Sp 2 units PRBC..    6. Urinary retention. Initially required in and out catheterization.  Now with no signs of further urinary retention.  Currently with no cathter.    7. Dyslipidemia. On  atorvastatin.    8. NEW hypotension.  clinically resolved.      Status is: Inpatient  Remains inpatient appropriate because:Unsafe d/c plan  Dispo: The patient is from: Home              Anticipated d/c is to: Home              Patient currently is medically stable to d/c.   Difficult to place patient No   DVT prophylaxis: Enoxaparin   Code Status:    full  Family Communication:   I spoke with patient's son at the bedside, we talked in detail about patient's condition, plan of care and prognosis and all questions were addressed.   Subjective: Patient with no nausea or vomiting, no chest pain or dyspnea, her son is at the bedside. Continue to be very weak and deconditioned   Objective: Vitals:   06/13/21 1023 06/13/21 2120 06/14/21 0555 06/15/21 0644  BP: 138/73 (!) 162/59 (!) 150/68 (!) 167/75  Pulse: 75 69 65 62  Resp: 20 18 16 16   Temp: 97.6 F (36.4 C) 97.8 F (36.6 C) 98.2 F (36.8 C) 97.6 F (36.4 C)  TempSrc: Oral Oral  Oral Oral  SpO2: 98% 97% 100% 99%  Weight:      Height:        Intake/Output Summary (Last 24 hours) at 06/15/2021 1309 Last data filed at 06/15/2021 0600 Gross per 24 hour  Intake 240 ml  Output 150 ml  Net 90 ml   Filed Weights   05/31/21 2309 06/02/21 0233  Weight: 46.8 kg 46.8 kg    Examination:   General: Not in pain or dyspnea  Neurology: Awake and alert, non focal  E ENT: mild pallor, no icterus, oral mucosa moist Cardiovascular: No JVD. S1-S2 present, rhythmic, no gallops, rubs, or murmurs. No lower extremity edema. Pulmonary:  positive breath sounds bilaterally, adequate air movement, no wheezing, rhonchi or rales. Gastrointestinal. Abdomen soft and non tender Skin. No rashes Musculoskeletal: no joint deformities     Data Reviewed: I have personally reviewed following labs and imaging studies  CBC: No results for input(s): WBC, NEUTROABS, HGB, HCT, MCV, PLT in the last 168 hours. Basic Metabolic Panel: No results for input(s): NA, K, CL, CO2, GLUCOSE, BUN, CREATININE, CALCIUM, MG, PHOS in the last 168 hours. GFR: Estimated Creatinine Clearance: 32.3 mL/min (by C-G formula based on SCr of 0.5 mg/dL). Liver Function Tests: No results for input(s): AST, ALT, ALKPHOS, BILITOT, PROT, ALBUMIN in the last 168 hours. No results for input(s): LIPASE, AMYLASE in the last 168 hours. No results for input(s): AMMONIA in the last 168 hours. Coagulation Profile: No results for input(s): INR, PROTIME in the last 168 hours. Cardiac Enzymes: No results for input(s): CKTOTAL, CKMB, CKMBINDEX, TROPONINI in the last 168 hours. BNP (last 3 results) No results for input(s): PROBNP in the last 8760 hours. HbA1C: No results for input(s): HGBA1C in the last 72 hours. CBG: No results for input(s): GLUCAP in the last 168 hours. Lipid Profile: No results for input(s): CHOL, HDL, LDLCALC, TRIG, CHOLHDL, LDLDIRECT in the last 72 hours. Thyroid Function Tests: No results for input(s): TSH, T4TOTAL, FREET4, T3FREE, THYROIDAB in the last 72 hours. Anemia Panel: No results for input(s): VITAMINB12, FOLATE, FERRITIN, TIBC, IRON, RETICCTPCT in the last 72 hours.    Radiology Studies: I have reviewed all of the imaging during this hospital visit personally     Scheduled Meds:  aspirin  81 mg Oral BID   atorvastatin  10 mg Oral Daily   donepezil  5 mg Oral QHS   levothyroxine  100 mcg Oral Once per day on Sun Fri Sat   levothyroxine  50 mcg Oral Once per day on Mon Tue Wed Thu   memantine  5 mg Oral Daily   Pirfenidone  801  mg Oral TID   tamsulosin  0.4 mg Oral Daily   Continuous Infusions:  methocarbamol (ROBAXIN) IV       LOS: 15 days        Swetha Rayle Annett Gula, MD

## 2021-06-15 NOTE — Progress Notes (Signed)
Difficulties with communication. Interpreter stated that the pt was confused. Pt not communicating with the interpreter.  Interpreter was able to identify that the pt needed help with going to the bathroom and pain management. Pt now appears comfortable and resting with the call bell and phone in reach.

## 2021-06-16 DIAGNOSIS — G3184 Mild cognitive impairment, so stated: Secondary | ICD-10-CM | POA: Diagnosis not present

## 2021-06-16 DIAGNOSIS — S72002A Fracture of unspecified part of neck of left femur, initial encounter for closed fracture: Secondary | ICD-10-CM | POA: Diagnosis not present

## 2021-06-16 DIAGNOSIS — J849 Interstitial pulmonary disease, unspecified: Secondary | ICD-10-CM | POA: Diagnosis not present

## 2021-06-16 DIAGNOSIS — E039 Hypothyroidism, unspecified: Secondary | ICD-10-CM | POA: Diagnosis not present

## 2021-06-16 NOTE — Progress Notes (Signed)
PROGRESS NOTE    Catherine Terrell  NAT:[id number] DOB: 1933-02-13 DOA: 05/31/2021 PCP: System, Provider Not In    Brief Narrative:  Catherine Terrell was admitted to the hospital with a working diagnosis of left hip fracture.   87 year old female past medical history for interstitial lung disease, hypothyroidism, and dyslipidemia who presented after mechanical fall.  Apparently she lost her balance while trying to stand up to go to the bathroom.  She will fell and landed on her left side.  Positive head trauma but no loss of consciousness.  After the fall she had severe pain at her left hip and she was brought to the hospital for further evaluation.  On her initial physical examination her blood pressure was 180/71, heart rate 64, respiratory rate 18, temperature 98.5, oxygen saturation 99% patient was only oriented to self, lungs are clear to auscultation, heart S1-S2, present, rhythmic, soft abdomen, no lower extremity edema.  Left leg was shortened and externally rotated.   Pelvic radiograph with comminuted impacted intertrochanteric left hip fracture with varus angulation.    Patient was seen by orthopedics and underwent open reduction internal fixation, nail intratrochanteric, 8/28.   Postoperatively patient developed encephalopathy, confusion and delirium. Patient very weak and deconditioned.  Requires high of nursing care. Difficult placement due to lack of insurance in [place].   Assessment & Plan:   Principal Problem:   Closed left hip fracture (HCC) Active Problems:   Interstitial lung disease (HCC)   Hypothyroidism   MCI (mild cognitive impairment)   Left hip fracture. Patient is sp ORIF.  Difficult placement, no insurance cover and her family can not take her back home, because lack of supervision.    Continue very weak and deconditioned, not able to go home with no supervision.    2. ILD. On  pirfenidone.  No signs of clinical exacerbation   3. Hypothyroid. Continue  with levothyroxine   4. Acute metabolic encephalopathy/ mild cognitive impairment.  Continue with namenda and aricept.  No agitation.    5. Acute post operative anemia. Blood loss anemia. Sp 2 units PRBC..    6. Urinary retention. Initially required in and out catheterization.  Now with no signs of further urinary retention.  No cathter.    7. Dyslipidemia. continue with  atorvastatin.    8. NEW hypotension.  clinically resolved  Status is: Inpatient  Remains inpatient appropriate because:Inpatient level of care appropriate due to severity of illness  Dispo: The patient is from: Home              Anticipated d/c is to: Home              Patient currently is not medically stable to d/c.   Difficult to place patient No   DVT prophylaxis: enoxaparin   Code Status:    full  Family Communication:  No family at the bedside     Consultants:  Orthopedics   Procedures:  ORIF left hip      Subjective: Patient with no nausea or vomiting, no chest pain or dyspnea, continue to have poor oral intake,,   Objective: Vitals:   06/15/21 0644 06/15/21 1415 06/15/21 2133 06/16/21 0622  BP: (!) 167/75 (!) 130/58 (!) 156/67 (!) 133/59  Pulse: 62 64 69 73  Resp: 16  16 16   Temp: 97.6 F (36.4 C)  98.1 F (36.7 C) 98.6 F (37 C)  TempSrc: Oral  Oral Oral  SpO2: 99% 97% 98% 99%  Weight:  Height:        Intake/Output Summary (Last 24 hours) at 06/16/2021 1510 Last data filed at 06/16/2021 1400 Gross per 24 hour  Intake 1080 ml  Output 2100 ml  Net -1020 ml   Filed Weights   05/31/21 2309 06/02/21 0233  Weight: 46.8 kg 46.8 kg    Examination:   General: Not in pain or dyspnea.  Neurology: Awake and alert, non focal  E ENT: no pallor, no icterus, oral mucosa moist Cardiovascular: No JVD. S1-S2 present, rhythmic, no gallops, rubs, or murmurs. No lower extremity edema. Pulmonary: positive breath sounds bilaterally, adequate air movement, no wheezing, rhonchi or  rales. Gastrointestinal. Abdomen soft and non tender Skin. No rashes Musculoskeletal: no joint deformities     Data Reviewed: I have personally reviewed following labs and imaging studies  CBC: No results for input(s): WBC, NEUTROABS, HGB, HCT, MCV, PLT in the last 168 hours. Basic Metabolic Panel: No results for input(s): NA, K, CL, CO2, GLUCOSE, BUN, CREATININE, CALCIUM, MG, PHOS in the last 168 hours. GFR: Estimated Creatinine Clearance: 32.3 mL/min (by C-G formula based on SCr of 0.5 mg/dL). Liver Function Tests: No results for input(s): AST, ALT, ALKPHOS, BILITOT, PROT, ALBUMIN in the last 168 hours. No results for input(s): LIPASE, AMYLASE in the last 168 hours. No results for input(s): AMMONIA in the last 168 hours. Coagulation Profile: No results for input(s): INR, PROTIME in the last 168 hours. Cardiac Enzymes: No results for input(s): CKTOTAL, CKMB, CKMBINDEX, TROPONINI in the last 168 hours. BNP (last 3 results) No results for input(s): PROBNP in the last 8760 hours. HbA1C: No results for input(s): HGBA1C in the last 72 hours. CBG: No results for input(s): GLUCAP in the last 168 hours. Lipid Profile: No results for input(s): CHOL, HDL, LDLCALC, TRIG, CHOLHDL, LDLDIRECT in the last 72 hours. Thyroid Function Tests: No results for input(s): TSH, T4TOTAL, FREET4, T3FREE, THYROIDAB in the last 72 hours. Anemia Panel: No results for input(s): VITAMINB12, FOLATE, FERRITIN, TIBC, IRON, RETICCTPCT in the last 72 hours.    Radiology Studies: I have reviewed all of the imaging during this hospital visit personally     Scheduled Meds:  aspirin  81 mg Oral BID   atorvastatin  10 mg Oral Daily   donepezil  5 mg Oral QHS   levothyroxine  100 mcg Oral Once per day on Sun Fri Sat   levothyroxine  50 mcg Oral Once per day on Mon Tue Wed Thu   memantine  5 mg Oral Daily   Pirfenidone  801 mg Oral TID   Continuous Infusions:   LOS: 16 days        Catherine Terrell Annett Gula, MD

## 2021-06-16 NOTE — Progress Notes (Signed)
Physical Therapy Treatment Patient Details Name: Catherine Terrell MRN: [id number] DOB: Mar 11, 1933 Today's Date: 06/16/2021    History of Present Illness 87 yo female adm 05/31/21 after fall resulting in L IT hip fx. Pt s/p s/p ORIF IM Nail 06/02/21.  PMH: bil TKAs (done in [place]), dementia, interstitial lung disease.    PT Comments    Pt agreeable to OOB to chair with PT.  Telephone interpreter assisting in communication with pt. Pt remains anxious, fearful of falling, and appreciative of help.  She remains mod assist for most mobility and would certainly be safer with two person assist to progress safe gait.  Exercises completed and pt left resting in chair.  RN aware she is up. PT will continue to follow acutely for safe mobility progression.   Follow Up Recommendations  SNF;Other (comment) (vs HH)     Equipment Recommendations  Rolling walker with 5" wheels;Wheelchair (measurements PT);Wheelchair cushion (measurements PT);Hospital bed;3in1 (PT)    Recommendations for Other Services       Precautions / Restrictions Precautions Precautions: Fall Restrictions Weight Bearing Restrictions: Yes LLE Weight Bearing: Partial weight bearing LLE Partial Weight Bearing Percentage or Pounds: 50    Mobility  Bed Mobility Overal bed mobility: Needs Assistance Bed Mobility: Supine to Sit     Supine to sit: Mod assist;HOB elevated     General bed mobility comments: Pt coming up to EOB better today, but still needs mod assist to scoot out to EOB and lift trunk the last 1/2 way up to sitting.    Transfers Overall transfer level: Needs assistance Equipment used: Rolling walker (2 wheeled) Transfers: Sit to/from [employer] Sit to Stand: Mod assist;From elevated surface Stand pivot transfers: Mod assist;From elevated surface       General transfer comment: Mod assist to support trunk and steady RW to come to standing EOB and take 3-4 pivotal steps to the  chair.  Ambulation/Gait             General Gait Details: Did not progress gait as it was just me (one preson) and two are needed for safe gait progression.   Stairs             Wheelchair Mobility    Modified Rankin (Stroke Patients Only)       Balance Overall balance assessment: Needs assistance Sitting-balance support: Feet supported;Bilateral upper extremity supported Sitting balance-Leahy Scale: Poor Sitting balance - Comments: mod assist progressing to min assist EOB with bil UE supported, fearful of scooting to the edge and posterior preference until both feet were touching the floor. Postural control: Posterior lean Standing balance support: Bilateral upper extremity supported Standing balance-Leahy Scale: Poor Standing balance comment: needs external support (mod) from PT and both hands on RW.                            Cognition Arousal/Alertness: Awake/alert Behavior During Therapy: Restless;Anxious Overall Cognitive Status: History of cognitive impairments - at baseline                                        Exercises General Exercises - Lower Extremity Ankle Circles/Pumps: AAROM;Both;20 reps Long Arc Quad: AAROM;Both;10 reps Heel Slides: AAROM;Both;10 reps Hip ABduction/ADduction: AAROM;Both;10 reps    General Comments General comments (skin integrity, edema, etc.): O2 sats 100% and HR 85 bpm despite anxious breathing  pattern, I think this is related to fear and anxiety.      Pertinent Vitals/Pain Pain Assessment: Faces Faces Pain Scale: Hurts even more Pain Location: Lt hip/thigh Pain Descriptors / Indicators: Grimacing Pain Intervention(s): Limited activity within patient's tolerance;Monitored during session;Repositioned    Home Living                      Prior Function            PT Goals (current goals can now be found in the care plan section) Progress towards PT goals: Progressing toward  goals    Frequency    Min 4X/week      PT Plan Current plan remains appropriate    Co-evaluation              AM-PAC PT "6 Clicks" Mobility   Outcome Measure  Help needed turning from your back to your side while in a flat bed without using bedrails?: A Lot Help needed moving from lying on your back to sitting on the side of a flat bed without using bedrails?: A Lot Help needed moving to and from a bed to a chair (including a wheelchair)?: A Lot Help needed standing up from a chair using your arms (e.g., wheelchair or bedside chair)?: A Lot Help needed to walk in hospital room?: A Lot Help needed climbing 3-5 steps with a railing? : Total 6 Click Score: 11    End of Session Equipment Utilized During Treatment: Gait belt Activity Tolerance: Patient limited by pain;Other (comment) (and anxiety) Patient left: in chair;with call bell/phone within reach Nurse Communication: Mobility status PT Visit Diagnosis: Other abnormalities of gait and mobility (R26.89)     Time: 1962-2297 PT Time Calculation (min) (ACUTE ONLY): 23 min  Charges:  $Therapeutic Exercise: 8-22 mins $Therapeutic Activity: 8-22 mins                     Corinna Capra, PT, DPT  Acute Rehabilitation Ortho Tech Supervisor [phone] pager [phone]) [phone] office

## 2021-06-17 DIAGNOSIS — E039 Hypothyroidism, unspecified: Secondary | ICD-10-CM | POA: Diagnosis not present

## 2021-06-17 DIAGNOSIS — G3184 Mild cognitive impairment, so stated: Secondary | ICD-10-CM | POA: Diagnosis not present

## 2021-06-17 DIAGNOSIS — S72002A Fracture of unspecified part of neck of left femur, initial encounter for closed fracture: Secondary | ICD-10-CM | POA: Diagnosis not present

## 2021-06-17 DIAGNOSIS — J849 Interstitial pulmonary disease, unspecified: Secondary | ICD-10-CM | POA: Diagnosis not present

## 2021-06-17 NOTE — Progress Notes (Signed)
PROGRESS NOTE    Catherine Terrell  ZOX:[id number] DOB: 12-29-1932 DOA: 05/31/2021 PCP: System, Provider Not In    Brief Narrative:  Mrs. Belfield was admitted to the hospital with a working diagnosis of left hip fracture.   87 year old female past medical history for interstitial lung disease, hypothyroidism, and dyslipidemia who presented after mechanical fall.  Apparently she lost her balance while trying to stand up to go to the bathroom.  She will fell and landed on her left side.  Positive head trauma but no loss of consciousness.  After the fall she had severe pain at her left hip and she was brought to the hospital for further evaluation.  On her initial physical examination her blood pressure was 180/71, heart rate 64, respiratory rate 18, temperature 98.5, oxygen saturation 99% patient was only oriented to self, lungs are clear to auscultation, heart S1-S2, present, rhythmic, soft abdomen, no lower extremity edema.  Left leg was shortened and externally rotated.   Pelvic radiograph with comminuted impacted intertrochanteric left hip fracture with varus angulation.    Patient was seen by orthopedics and underwent open reduction internal fixation, nail intratrochanteric, 8/28.   Postoperatively patient developed encephalopathy, confusion and delirium. Patient very weak and deconditioned.  Requires high of nursing care. Difficult placement due to lack of insurance in [place].     Assessment & Plan:   Principal Problem:   Closed left hip fracture (HCC) Active Problems:   Interstitial lung disease (HCC)   Hypothyroidism   MCI (mild cognitive impairment)     Left hip fracture. Patient is sp ORIF.  Difficult placement, no insurance cover and her family can not take her back home, because lack of supervision.    Patient continue to need assistance for mobility, her son not able to take care of her at home as long she can not transfer at least from the bed to the  commode. Post-operative pain is controlled with analgesics   2. ILD. continue with  pirfenidone.  No signs of acute clinical exacerbation   3. Hypothyroid. On levothyroxine   4. Acute metabolic encephalopathy/ mild cognitive impairment.  On namenda and aricept.  No agitation.    5. Acute post operative anemia. Blood loss anemia. Sp 2 units PRBC.Marland Kitchen  Check cell count as needed.    6. Urinary retention. Initially required in and out catheterization.  Now with no signs of further urinary retention.  No  longer has a urinary cathter.    7. Dyslipidemia. On  atorvastatin.    8. NEW hypotension.  clinically resolved  Status is: Inpatient  Remains inpatient appropriate because:Inpatient level of care appropriate due to severity of illness  Dispo: The patient is from: Home              Anticipated d/c is to: Home              Patient currently is medically stable to d/c.   Difficult to place patient Yes   DVT prophylaxis: Enoxaparin   Code Status:    full  Family Communication:   No family at the bedside     Consultants:  Orthopedics   Procedures:   ORIF left     Subjective: Patient with no nausea or vomiting, no chest pain or dyspnea, continue to be very weak and deconditioned   Objective: Vitals:   06/16/21 0622 06/16/21 2135 06/17/21 0624 06/17/21 1311  BP: (!) 133/59 (!) 145/87 (!) 147/67 131/85  Pulse: 73 (!) 106 73 94  Resp: 16 16 18 16   Temp: 98.6 F (37 C) 98.7 F (37.1 C) 98.6 F (37 C) 97.6 F (36.4 C)  TempSrc: Oral Oral Oral Oral  SpO2: 99%  98% 100%  Weight:      Height:        Intake/Output Summary (Last 24 hours) at 06/17/2021 1530 Last data filed at 06/17/2021 1400 Gross per 24 hour  Intake 440 ml  Output 1200 ml  Net -760 ml   Filed Weights   05/31/21 2309 06/02/21 0233  Weight: 46.8 kg 46.8 kg    Examination:   General: Not in pain or dyspnea, deconditioned  Neurology: Awake and alert, non focal  E ENT: mild pallor, no icterus,  oral mucosa moist Cardiovascular: No JVD. S1-S2 present, rhythmic, no gallops, rubs, or murmurs. No lower extremity edema. Pulmonary: positive breath sounds bilaterally, Gastrointestinal. Abdomen  soft and non tender Skin. No rashes Musculoskeletal: no joint deformities     Data Reviewed: I have personally reviewed following labs and imaging studies  CBC: No results for input(s): WBC, NEUTROABS, HGB, HCT, MCV, PLT in the last 168 hours. Basic Metabolic Panel: No results for input(s): NA, K, CL, CO2, GLUCOSE, BUN, CREATININE, CALCIUM, MG, PHOS in the last 168 hours. GFR: Estimated Creatinine Clearance: 32.3 mL/min (by C-G formula based on SCr of 0.5 mg/dL). Liver Function Tests: No results for input(s): AST, ALT, ALKPHOS, BILITOT, PROT, ALBUMIN in the last 168 hours. No results for input(s): LIPASE, AMYLASE in the last 168 hours. No results for input(s): AMMONIA in the last 168 hours. Coagulation Profile: No results for input(s): INR, PROTIME in the last 168 hours. Cardiac Enzymes: No results for input(s): CKTOTAL, CKMB, CKMBINDEX, TROPONINI in the last 168 hours. BNP (last 3 results) No results for input(s): PROBNP in the last 8760 hours. HbA1C: No results for input(s): HGBA1C in the last 72 hours. CBG: No results for input(s): GLUCAP in the last 168 hours. Lipid Profile: No results for input(s): CHOL, HDL, LDLCALC, TRIG, CHOLHDL, LDLDIRECT in the last 72 hours. Thyroid Function Tests: No results for input(s): TSH, T4TOTAL, FREET4, T3FREE, THYROIDAB in the last 72 hours. Anemia Panel: No results for input(s): VITAMINB12, FOLATE, FERRITIN, TIBC, IRON, RETICCTPCT in the last 72 hours.    Radiology Studies: I have reviewed all of the imaging during this hospital visit personally     Scheduled Meds:  aspirin  81 mg Oral BID   atorvastatin  10 mg Oral Daily   donepezil  5 mg Oral QHS   levothyroxine  100 mcg Oral Once per day on Sun Fri Sat   levothyroxine  50 mcg Oral  Once per day on Mon Tue Wed Thu   memantine  5 mg Oral Daily   Pirfenidone  801 mg Oral TID   Continuous Infusions:   LOS: 17 days        Pocahontas Cohenour Sun, MD

## 2021-06-18 DIAGNOSIS — J849 Interstitial pulmonary disease, unspecified: Secondary | ICD-10-CM | POA: Diagnosis not present

## 2021-06-18 DIAGNOSIS — S72002A Fracture of unspecified part of neck of left femur, initial encounter for closed fracture: Secondary | ICD-10-CM | POA: Diagnosis not present

## 2021-06-18 DIAGNOSIS — G3184 Mild cognitive impairment, so stated: Secondary | ICD-10-CM | POA: Diagnosis not present

## 2021-06-18 DIAGNOSIS — E039 Hypothyroidism, unspecified: Secondary | ICD-10-CM | POA: Diagnosis not present

## 2021-06-18 NOTE — Progress Notes (Signed)
Physical Therapy Treatment Patient Details Name: Catherine Terrell MRN: [id number] DOB: 1933-05-01 Today's Date: 06/18/2021   History of Present Illness 87 yo female adm 05/31/21 after fall resulting in L IT hip fx. Pt s/p s/p ORIF IM Nail 06/02/21.  PMH: bil TKAs (done in [place]), dementia, interstitial lung disease.    PT Comments    Attempted to use Hindi interpreter by phone, pt was unable to understand the interpreter, her hearing aids were in. Sit to stand x 4 trials with RW and Mod assist of 2, pt stood for ~20 seconds each trial for pericare, standing tolerance limited by fatigue. Stand pivot transfer to recliner. Pt then performed LLE strengthening exercises with assistance. Ambulation was not attempted due pt quickly fatiguing in standing.     Recommendations for follow up therapy are one component of a multi-disciplinary discharge planning process, led by the attending physician.  Recommendations may be updated based on patient status, additional functional criteria and insurance authorization.  Follow Up Recommendations  SNF     Equipment Recommendations  Rolling walker with 5" wheels;Wheelchair (measurements PT);Wheelchair cushion (measurements PT);Hospital bed;3in1 (PT)    Recommendations for Other Services       Precautions / Restrictions Precautions Precautions: Fall Restrictions LLE Weight Bearing: Partial weight bearing LLE Partial Weight Bearing Percentage or Pounds: 50     Mobility  Bed Mobility   Bed Mobility: Supine to Sit Rolling: Mod assist   Supine to sit: Mod assist;HOB elevated     General bed mobility comments: needs mod assist to scoot out to EOB and lift trunk the last 1/2 way up to sitting.    Transfers Overall transfer level: Needs assistance Equipment used: Rolling walker (2 wheeled)   Sit to Stand: Mod assist;From elevated surface;+2 safety/equipment Stand pivot transfers: Min assist;+2 safety/equipment       General transfer comment:  sit to stand x 4 trials for pericare, pt stood for ~20 seconds with RW each trial, then fatigued and returned to sitting on EOB. Then SPT to recliner.  Ambulation/Gait             General Gait Details: did not attempt as pt fatigued quickly with standing at edge of bed x 4 trials with RW   Stairs             Wheelchair Mobility    Modified Rankin (Stroke Patients Only)       Balance Overall balance assessment: Needs assistance Sitting-balance support: Feet supported;Bilateral upper extremity supported Sitting balance-Leahy Scale: Poor Sitting balance - Comments: could maintain trunk upright at times with feet supported and BUE support, then had some episodes of posterior lean requiring mod A to correct Postural control: Posterior lean Standing balance support: Bilateral upper extremity supported Standing balance-Leahy Scale: Poor Standing balance comment: relies on BUE support                            Cognition Arousal/Alertness: Awake/alert Behavior During Therapy: WFL for tasks assessed/performed Overall Cognitive Status: No family/caregiver present to determine baseline cognitive functioning                                 General Comments: per chart review patient has history of dementia, pt unable to understand Hindi intrepreter by phone even with hearing aids in. She did communicate in English a few times ("milk hot" when requesting her milk be  heated up, and counted in English when doing LE exercises)      Exercises General Exercises - Lower Extremity Ankle Circles/Pumps: AAROM;Both;20 reps Long Arc Quad: AAROM;Both;15 reps Heel Slides: AAROM;Both;15 reps Hip ABduction/ADduction: AAROM;Both;15 reps    General Comments        Pertinent Vitals/Pain Faces Pain Scale: Hurts a little bit Pain Location: Lt hip/thigh during standing Pain Descriptors / Indicators: Grimacing Pain Intervention(s): Limited activity within patient's  tolerance;Monitored during session;Repositioned    Home Living                      Prior Function            PT Goals (current goals can now be found in the care plan section) Acute Rehab PT Goals Patient Stated Goal: "milk hot" (pt requested her milk be warmed up, RN aware) PT Goal Formulation: Patient unable to participate in goal setting Time For Goal Achievement: 06/26/21 (extended by 2 weeks) Potential to Achieve Goals: Fair Progress towards PT goals: Progressing toward goals    Frequency    Min 4X/week      PT Plan Current plan remains appropriate    Co-evaluation              AM-PAC PT "6 Clicks" Mobility   Outcome Measure  Help needed turning from your back to your side while in a flat bed without using bedrails?: A Little Help needed moving from lying on your back to sitting on the side of a flat bed without using bedrails?: A Lot Help needed moving to and from a bed to a chair (including a wheelchair)?: A Lot Help needed standing up from a chair using your arms (e.g., wheelchair or bedside chair)?: A Lot Help needed to walk in hospital room?: A Lot Help needed climbing 3-5 steps with a railing? : Total 6 Click Score: 12    End of Session Equipment Utilized During Treatment: Gait belt Activity Tolerance: Patient limited by fatigue Patient left: in chair;with call bell/phone within reach Nurse Communication: Mobility status PT Visit Diagnosis: Other abnormalities of gait and mobility (R26.89)     Time: 4580-9983 PT Time Calculation (min) (ACUTE ONLY): 21 min  Charges:  $Therapeutic Activity: 8-22 mins                     Ralene Bathe Kistler PT 06/18/2021  Acute Rehabilitation Services Pager [phone] Office [phone]

## 2021-06-18 NOTE — Progress Notes (Signed)
PROGRESS NOTE    Catherine Terrell  VFI:[id number] DOB: September 30, 1933 DOA: 05/31/2021 PCP: System, Provider Not In    Brief Narrative:  Catherine Terrell was admitted to the hospital with a working diagnosis of left hip fracture.   87 year old female past medical history for interstitial lung disease, hypothyroidism, and dyslipidemia who presented after mechanical fall.  Apparently she lost her balance while trying to stand up to go to the bathroom.  She will fell and landed on her left side.  Positive head trauma but no loss of consciousness.  After the fall she had severe pain at her left hip and she was brought to the hospital for further evaluation.  On her initial physical examination her blood pressure was 180/71, heart rate 64, respiratory rate 18, temperature 98.5, oxygen saturation 99% patient was only oriented to self, lungs are clear to auscultation, heart S1-S2, present, rhythmic, soft abdomen, no lower extremity edema.  Left leg was shortened and externally rotated.   Pelvic radiograph with comminuted impacted intertrochanteric left hip fracture with varus angulation.    Patient was seen by orthopedics and underwent open reduction internal fixation, nail intratrochanteric, 8/28.   Postoperatively patient developed encephalopathy, confusion and delirium. Patient very weak and deconditioned.  Requires high of nursing care. Difficult placement due to lack of insurance in [place].  I spoke with her son about patient continue to being very weak and deconditioned. Not much progress with physical therapy in the hospital. Ideally she will need a SNF for more intense therapy and reconditioning. He is planing to take his mother back to [place] on 07/09/21.   Assessment & Plan:   Principal Problem:   Closed left hip fracture (HCC) Active Problems:   Interstitial lung disease (HCC)   Hypothyroidism   MCI (mild cognitive impairment)     Left hip fracture. Patient is sp ORIF.  Difficult  placement, no insurance cover and her family can not take her back home, because lack of supervision.    Continue to need assistance for mobility. Her son not able to take care of her at home as long she can not transfer at least from the bed to the commode.  Patient now day 17 of hospitalization and continue to be very weak.   As needed analgesics for pain control, patient very weak and deconditioned    2. ILD. continue with  pirfenidone.  No  clinical exacerbation   3. Hypothyroid. Continue with levothyroxine   4. Acute metabolic encephalopathy/ mild cognitive impairment.  Continue with namenda and aricept.  No agitation. Language barrier present    5. Acute post operative anemia. Blood loss anemia. Sp 2 units PRBC..     6. Urinary retention. Initially required in and out catheterization.  Now with no signs of further urinary retention.  No  longer has a urinary cathter.    7. Dyslipidemia. Continue with  atorvastatin.    8. NEW hypotension.  clinically resolved   Status is: Inpatient  Remains inpatient appropriate because:Unsafe d/c plan  Dispo: The patient is from: Home              Anticipated d/c is to: Home              Patient currently is medically stable to d/c.   Difficult to place patient No   DVT prophylaxis: Enoxaparin   Code Status:    full  Family Communication:  I spoke over the phone with the patient's son about patient's  condition, plan  of care, prognosis and all questions were addressed.     Consultants:  Orthopedics   Procedures:  ORIF    Subjective: Patient continue to be very weak and deconditioned, she is out of bed in the chair at the time of my examination, complains of being very tired.   Objective: Vitals:   06/17/21 0624 06/17/21 1311 06/18/21 0529 06/18/21 1409  BP: (!) 147/67 131/85 (!) 158/65 (!) 159/71  Pulse: 73 94 70 98  Resp: 18 16 15 18   Temp: 98.6 F (37 C) 97.6 F (36.4 C)  97.6 F (36.4 C)  TempSrc: Oral Oral     SpO2: 98% 100% 100% 98%  Weight:      Height:        Intake/Output Summary (Last 24 hours) at 06/18/2021 1611 Last data filed at 06/18/2021 1000 Gross per 24 hour  Intake 360 ml  Output 800 ml  Net -440 ml   Filed Weights   05/31/21 2309 06/02/21 0233  Weight: 46.8 kg 46.8 kg    Examination:   General: deconditioned  Neurology: Awake and alert, non focal  E ENT: no pallor, no icterus, oral mucosa moist Cardiovascular: No JVD. S1-S2 present, rhythmic, no gallops, rubs, or murmurs. No lower extremity edema. Pulmonary: positive breath sounds bilaterally, adequate air movement, no wheezing, rhonchi or rales. Gastrointestinal. Abdomen soft and non tender Skin. No rashes Musculoskeletal: no joint deformities     Data Reviewed: I have personally reviewed following labs and imaging studies  CBC: No results for input(s): WBC, NEUTROABS, HGB, HCT, MCV, PLT in the last 168 hours. Basic Metabolic Panel: No results for input(s): NA, K, CL, CO2, GLUCOSE, BUN, CREATININE, CALCIUM, MG, PHOS in the last 168 hours. GFR: Estimated Creatinine Clearance: 32.3 mL/min (by C-G formula based on SCr of 0.5 mg/dL). Liver Function Tests: No results for input(s): AST, ALT, ALKPHOS, BILITOT, PROT, ALBUMIN in the last 168 hours. No results for input(s): LIPASE, AMYLASE in the last 168 hours. No results for input(s): AMMONIA in the last 168 hours. Coagulation Profile: No results for input(s): INR, PROTIME in the last 168 hours. Cardiac Enzymes: No results for input(s): CKTOTAL, CKMB, CKMBINDEX, TROPONINI in the last 168 hours. BNP (last 3 results) No results for input(s): PROBNP in the last 8760 hours. HbA1C: No results for input(s): HGBA1C in the last 72 hours. CBG: No results for input(s): GLUCAP in the last 168 hours. Lipid Profile: No results for input(s): CHOL, HDL, LDLCALC, TRIG, CHOLHDL, LDLDIRECT in the last 72 hours. Thyroid Function Tests: No results for input(s): TSH, T4TOTAL,  FREET4, T3FREE, THYROIDAB in the last 72 hours. Anemia Panel: No results for input(s): VITAMINB12, FOLATE, FERRITIN, TIBC, IRON, RETICCTPCT in the last 72 hours.    Radiology Studies: I have reviewed all of the imaging during this hospital visit personally     Scheduled Meds:  aspirin  81 mg Oral BID   atorvastatin  10 mg Oral Daily   donepezil  5 mg Oral QHS   levothyroxine  100 mcg Oral Once per day on Sun Fri Sat   levothyroxine  50 mcg Oral Once per day on Mon Tue Wed Thu   memantine  5 mg Oral Daily   Pirfenidone  801 mg Oral TID   Continuous Infusions:   LOS: 18 days        Leathie Weich Fri, MD

## 2021-06-19 DIAGNOSIS — G3184 Mild cognitive impairment, so stated: Secondary | ICD-10-CM | POA: Diagnosis not present

## 2021-06-19 DIAGNOSIS — S72002A Fracture of unspecified part of neck of left femur, initial encounter for closed fracture: Secondary | ICD-10-CM | POA: Diagnosis not present

## 2021-06-19 DIAGNOSIS — E039 Hypothyroidism, unspecified: Secondary | ICD-10-CM | POA: Diagnosis not present

## 2021-06-19 DIAGNOSIS — J849 Interstitial pulmonary disease, unspecified: Secondary | ICD-10-CM | POA: Diagnosis not present

## 2021-06-19 NOTE — Progress Notes (Signed)
PROGRESS NOTE    Catherine Terrell  VOJ:[id number] DOB: 1933-06-09 DOA: 05/31/2021 PCP: System, Provider Not In   Brief Narrative: Catherine Terrell is a 87 y.o. female with a history of ILD, hypothyroidism, hyperlipidemia, cognitive impairment. Patient presented secondary to a fall, left hip pain and found to have a left hip fracture. Orthopedic surgery was consulted and performed IM nail placement. Hospitalization complicated by post-op bleeding requiring 2 units of PRBC. Stable for discharge to SNF.   Assessment & Plan:   Principal Problem:   Closed left hip fracture (HCC) Active Problems:   Interstitial lung disease (HCC)   Hypothyroidism   MCI (mild cognitive impairment)   Left hip fracture Patient suffered a fall and subsequent left hip fracture. Orthopedic surgery consulted on admission and performed IM nail placement on 8/28. Physical therapy recommended SNF. Partial weight bearing. Return to clinic in 2 weeks; may need to have orthopedic surgery re-consult for post-op check while inpatient if remains admitted.  Interstitial lung disease Idiopathic pulmonary fibrosis Chronic respiratory failure with hypoxia Noted on history. Currently stable on room air. Patient follows with pulmonology as an outpatient. -Continue pirfenidone  Hypothyroidism -Continue Synthroid  Acute metabolic encephalopathy Noted in chart. Patient seems to have likely cognitive impairment. Appears to be confused but no encephalopathy currently.  Mild cognitive impairment Patient is on Aricept as an outpatient -Continue Aricept  Acute blood loss anemia Post-op bleeding Hemoglobin down to a low of 5.7 post-op. Patient has received 2 units of PRBC to date. Hemoglobin stable.  Acute urinary retention Patient initially managed with serial in/out catheterization required placement of foley catheter on 8/30 for short duration prior to her pulling out the catheter. No recurrent  retention.  Hyperlipidemia -Continue Lipitor  Hypotension Resolved.   DVT prophylaxis: Per orthopedic surgery: aspirin Code Status:   Code Status: Full Code Family Communication: None at bedside Disposition Plan: Discharge to SNF when bed is available. Stable for discharge.   Consultants:  Orthopedic surgery  Procedures:  IM NAIL PLACEMENT OF LET HIP (06/02/2021)  Antimicrobials: None    Subjective: Patient voices that she wants warm milk, bread and butter. Attempts made to use the interpreter but patient not adherent with communicating with the interpreter.  Objective: Vitals:   06/18/21 0529 06/18/21 1409 06/18/21 2102 06/19/21 0518  BP: (!) 158/65 (!) 159/71 (!) 143/65 (!) 157/78  Pulse: 70 98 72 73  Resp: 15 18 18 18   Temp:  97.6 F (36.4 C) 97.8 F (36.6 C) (!) 97.5 F (36.4 C)  TempSrc:   Oral Oral  SpO2: 100% 98% 99% 100%  Weight:      Height:        Intake/Output Summary (Last 24 hours) at 06/19/2021 1438 Last data filed at 06/19/2021 0630 Gross per 24 hour  Intake 720 ml  Output 1700 ml  Net -980 ml   Filed Weights   05/31/21 2309 06/02/21 0233  Weight: 46.8 kg 46.8 kg    Examination:  General exam: Appears calm and comfortable Respiratory system: Rales on anterior auscultation. Respiratory effort normal. Cardiovascular system: S1 & S2 heard, RRR. 2/6 systolic murmur Gastrointestinal system: Abdomen is nondistended, soft and nontender. No organomegaly or masses felt. Normal bowel sounds heard. Central nervous system: Alert. No focal neurological deficits. Musculoskeletal: No edema. No calf tenderness Skin: No cyanosis. No rashes    Data Reviewed: I have personally reviewed following labs and imaging studies  CBC Lab Results  Component Value Date   WBC 11.5 (H)  06/05/2021   RBC 3.67 (L) 06/05/2021   HGB 11.6 (L) 06/05/2021   HCT 33.7 (L) 06/05/2021   MCV 91.8 06/05/2021   MCH 31.6 06/05/2021   PLT 295 06/05/2021   MCHC 34.4  06/05/2021   RDW 17.1 (H) 06/05/2021   LYMPHSABS 1.6 06/03/2021   MONOABS 1.3 (H) 06/03/2021   EOSABS 0.0 06/03/2021   BASOSABS 0.0 06/03/2021     Last metabolic panel Lab Results  Component Value Date   NA 133 (L) 06/05/2021   K 3.8 06/05/2021   CL 99 06/05/2021   CO2 25 06/05/2021   BUN 15 06/05/2021   CREATININE 0.50 06/05/2021   GLUCOSE 120 (H) 06/05/2021   GFRNONAA >60 06/05/2021   CALCIUM 8.3 (L) 06/05/2021   PROT 5.9 (L) 06/05/2021   ALBUMIN 2.5 (L) 06/05/2021   BILITOT 0.8 06/05/2021   ALKPHOS 45 06/05/2021   AST 107 (H) 06/05/2021   ALT 45 (H) 06/05/2021   ANIONGAP 9 06/05/2021    CBG (last 3)  No results for input(s): GLUCAP in the last 72 hours.   GFR: Estimated Creatinine Clearance: 32.3 mL/min (by C-G formula based on SCr of 0.5 mg/dL).  Coagulation Profile: No results for input(s): INR, PROTIME in the last 168 hours.  No results found for this or any previous visit (from the past 240 hour(s)).      Radiology Studies: No results found.      Scheduled Meds:  aspirin  81 mg Oral BID   atorvastatin  10 mg Oral Daily   donepezil  5 mg Oral QHS   levothyroxine  100 mcg Oral Once per day on Sun Fri Sat   levothyroxine  50 mcg Oral Once per day on Mon Tue Wed Thu   memantine  5 mg Oral Daily   Pirfenidone  801 mg Oral TID   Continuous Infusions:   LOS: 19 days     Jacquelin Hawking, MD [facility] 06/19/2021, 2:38 PM  If 7PM-7AM, please contact night-coverage [email or web address]

## 2021-06-19 NOTE — Progress Notes (Signed)
   Subjective: 17 Days Post-Op Procedure(s) (LRB): INTRAMEDULLARY (IM) NAIL INTERTROCHANTRIC (Left) Patient reports pain as mild.   Patient seen in rounds for Dr. Charlann Boxer. Patient is resting in bed eating breakfast with the help of her nurse this AM. She is in good spirits. Our communication is limited due to language barrier.  We will continue therapy today.   Objective: Vital signs in last 24 hours: Temp:  [97.5 F (36.4 C)-97.8 F (36.6 C)] 97.5 F (36.4 C) (09/14 0518) Pulse Rate:  [72-98] 73 (09/14 0518) Resp:  [18] 18 (09/14 0518) BP: (143-159)/(65-78) 157/78 (09/14 0518) SpO2:  [98 %-100 %] 100 % (09/14 0518)  Intake/Output from previous day:  Intake/Output Summary (Last 24 hours) at 06/19/2021 0823 Last data filed at 06/19/2021 0630 Gross per 24 hour  Intake 720 ml  Output 2000 ml  Net -1280 ml     Intake/Output this shift: No intake/output data recorded.  Labs: No results for input(s): HGB in the last 72 hours. No results for input(s): WBC, RBC, HCT, PLT in the last 72 hours. No results for input(s): NA, K, CL, CO2, BUN, CREATININE, GLUCOSE, CALCIUM in the last 72 hours. No results for input(s): LABPT, INR in the last 72 hours.  Exam: General - Patient is Alert and Oriented Extremity - Neurologically intact Sensation intact distally Intact pulses distally Dorsiflexion/Plantar flexion intact Dressing - dressing C/D/I. Aquacel removed today. Incisions with dermabond present. Appear to be healing well, no drainage or erythema. Motor Function - intact, moving foot and toes well on exam.   Past Medical History:  Diagnosis Date   Interstitial lung disease (HCC)    gets chronic shortness of breath from this    Assessment/Plan: 17 Days Post-Op Procedure(s) (LRB): INTRAMEDULLARY (IM) NAIL INTERTROCHANTRIC (Left) Principal Problem:   Closed left hip fracture (HCC) Active Problems:   Interstitial lung disease (HCC)   Hypothyroidism   MCI (mild cognitive  impairment)  Estimated body mass index is 23.13 kg/m as calculated from the following:   Height as of this encounter: 4\' 8"  (1.422 m).   Weight as of this encounter: 46.8 kg. Advance diet Up with therapy  DVT Prophylaxis - Aspirin Weight bearing as tolerated.  It sounds as if the plan is for transfer to in October. Our visit today may serve as her normal 2 week post op. I discussed with RN that this is typically just a wound check. Her incisions are healing well.   We may arrange for XR of the hip here prior to transfer in October, as we would typically have a 6 week post op with xrays.   Continue PT, continue pain management.   November, PA-C Orthopedic Surgery [phone] 06/19/2021, 8:23 AM

## 2021-06-19 NOTE — Progress Notes (Signed)
Occupational Therapy Progress Note  Upon arrival patient asking about breakfast and pointing to purewick repeatedly. Attempted using phone translation however patient having difficulty understanding. Patient min A x2 stand pivot to bedside commode and total A for perianal care after bowel movement due to reliant on bilateral upper extremities in standing. Patient then able to take few steps to recliner with bilateral upper extremity support to sit up for lunch. Spoke with case manager who states unfortunately no other family in area to assist with in person translation other than son that works until 6-630 during the week. Acute OT to continue to follow to maximize standing tolerance, overall strengthening and balance in order to reduce caregiver burden with ADLs.     06/19/21 1400  OT Visit Information  Last OT Received On 06/19/21  Assistance Needed +2 (for progression)  History of Present Illness 87 yo female adm 05/31/21 after fall resulting in L IT hip fx. Pt s/p s/p ORIF IM Nail 06/02/21.  PMH: bil TKAs (done in [place]), dementia, interstitial lung disease.  Precautions  Precautions Fall  Pain Assessment  Pain Assessment Faces  Faces Pain Scale 2  Pain Location Lt hip/thigh during standing  Pain Descriptors / Indicators Grimacing  Pain Intervention(s) Monitored during session  Cognition  Arousal/Alertness Awake/alert  Behavior During Therapy WFL for tasks assessed/performed  Overall Cognitive Status Difficult to assess  Difficult to assess due to Non-English speaking  ADL  Overall ADL's  Needs assistance/impaired  Toilet Transfer Minimal assistance;+2 for physical assistance;+2 for safety/equipment;Cueing for safety;Cueing for sequencing;Stand-pivot;BSC  Toilet Transfer Details (indicate cue type and reason) hand held x2 to complete stand pivot from bed to [facility], able to take few steps to recliner with bilateral hand held assistance  Toileting- Clothing Manipulation and Hygiene Total  assistance;Sit to/from stand  Toileting - Clothing Manipulation Details (indicate cue type and reason) reliant on external support in standing, total A for perianal care after bowel movement  Functional mobility during ADLs Minimal assistance;+2 for physical assistance;+2 for safety/equipment;Cueing for safety;Cueing for sequencing  Bed Mobility  Overal bed mobility Needs Assistance  Bed Mobility Supine to Sit  Supine to sit Mod assist;HOB elevated  General bed mobility comments mod A for LE management and initial uprighting of trunk from elevated bed height  Balance  Overall balance assessment Needs assistance  Sitting-balance support Feet supported  Sitting balance-Leahy Scale Fair  Standing balance support Bilateral upper extremity supported;During functional activity  Standing balance-Leahy Scale Poor  Standing balance comment relies on BUE support  Restrictions  Weight Bearing Restrictions Yes  LLE Weight Bearing PWB  LLE Partial Weight Bearing Percentage or Pounds 50  Transfers  Overall transfer level Needs assistance  Equipment used 2 person hand held assist  Transfers Stand Pivot Transfers  Stand pivot transfers Min assist;+2 physical assistance;+2 safety/equipment  General transfer comment please see toilet transfer in ADL section  OT - End of Session  Equipment Utilized During Treatment Gait belt  Activity Tolerance Patient tolerated treatment well  Patient left in chair;with call bell/phone within reach;with chair alarm set  Nurse Communication Mobility status;Other (comment) (had bowel movement)  OT Assessment/Plan  OT Plan Discharge plan remains appropriate  OT Visit Diagnosis History of falling (Z91.81);Other abnormalities of gait and mobility (R26.89);Unsteadiness on feet (R26.81)  OT Frequency (ACUTE ONLY) Min 2X/week  Follow Up Recommendations Other (comment) (SNF vs HH; no [place] insurance)  OT Equipment 3 in 1 bedside commode  AM-PAC OT "6 Clicks" Daily Activity  Outcome Measure (Version 2)  Help from another person eating meals? 3  Help from another person taking care of personal grooming? 3  Help from another person toileting, which includes using toliet, bedpan, or urinal? 2  Help from another person bathing (including washing, rinsing, drying)? 2  Help from another person to put on and taking off regular upper body clothing? 3  Help from another person to put on and taking off regular lower body clothing? 2  6 Click Score 15  Progressive Mobility  What is the highest level of mobility based on the progressive mobility assessment? Level 3 (Stands with assist) - Balance while standing  and cannot march in place  Mobility Out of bed for toileting;Out of bed to chair with meals  OT Goal Progression  Progress towards OT goals Progressing toward goals  Acute Rehab OT Goals  Patient Stated Goal "breakfast"  OT Goal Formulation With patient  Time For Goal Achievement 06/26/21  Potential to Achieve Goals Fair  ADL Goals  Pt Will Transfer to Toilet with min guard assist;stand pivot transfer;bedside commode (walker)  Additional ADL Goal #1 Patient will tolerate 5 minutes static standing with min A in order to participate in self care tasks.  Additional ADL Goal #2 Patient will perform lower body ADL tasks with min A in order to reduce caregiver burden.  OT Time Calculation  OT Start Time (ACUTE ONLY) 1105  OT Stop Time (ACUTE ONLY) 1128  OT Time Calculation (min) 23 min  OT General Charges  $OT Visit 1 Visit  OT Treatments  $Self Care/Home Management  23-37 mins   Marlyce Huge OT OT pager: [phone]

## 2021-06-20 DIAGNOSIS — E039 Hypothyroidism, unspecified: Secondary | ICD-10-CM | POA: Diagnosis not present

## 2021-06-20 DIAGNOSIS — J849 Interstitial pulmonary disease, unspecified: Secondary | ICD-10-CM | POA: Diagnosis not present

## 2021-06-20 DIAGNOSIS — G3184 Mild cognitive impairment, so stated: Secondary | ICD-10-CM | POA: Diagnosis not present

## 2021-06-20 DIAGNOSIS — S72002A Fracture of unspecified part of neck of left femur, initial encounter for closed fracture: Secondary | ICD-10-CM | POA: Diagnosis not present

## 2021-06-20 LAB — COMPREHENSIVE METABOLIC PANEL
ALT: 100 U/L — ABNORMAL HIGH (ref 0–44)
AST: 67 U/L — ABNORMAL HIGH (ref 15–41)
Albumin: 2.4 g/dL — ABNORMAL LOW (ref 3.5–5.0)
Alkaline Phosphatase: 147 U/L — ABNORMAL HIGH (ref 38–126)
Anion gap: 6 (ref 5–15)
BUN: 13 mg/dL (ref 8–23)
CO2: 25 mmol/L (ref 22–32)
Calcium: 8.5 mg/dL — ABNORMAL LOW (ref 8.9–10.3)
Chloride: 99 mmol/L (ref 98–111)
Creatinine, Ser: 0.71 mg/dL (ref 0.44–1.00)
GFR, Estimated: >60 mL/min (ref >60)
Glucose, Bld: 104 mg/dL — ABNORMAL HIGH (ref 70–99)
Potassium: 4.5 mmol/L (ref 3.5–5.1)
Sodium: 130 mmol/L — ABNORMAL LOW (ref 135–145)
Total Bilirubin: 0.7 mg/dL (ref 0.3–1.2)
Total Protein: 5.9 g/dL — ABNORMAL LOW (ref 6.5–8.1)

## 2021-06-20 NOTE — Progress Notes (Signed)
Patient's son expresses concern on previously discussed agreement between PT and him on having his mothers therapy @ 1830. He states that the past 6 days he has not seen a therapist at this stated time. He asked to speak to someone from therapy today upon arriving to the unit. Multiple staff members from therapy attempted to be contacted. No response. Son stated if he was not able to speak to someone from therapy today he would like to speak to a supervisor. AC notified of the situation.

## 2021-06-20 NOTE — Progress Notes (Addendum)
PROGRESS NOTE    Catherine Terrell  VOJ:[id number] DOB: 03-07-1933 DOA: 05/31/2021 PCP: System, Provider Not In   Brief Narrative: Catherine Terrell is a 87 y.o. female with a history of ILD, hypothyroidism, hyperlipidemia, cognitive impairment. Patient presented secondary to a fall, left hip pain and found to have a left hip fracture. Orthopedic surgery was consulted and performed IM nail placement. Hospitalization complicated by post-op bleeding requiring 2 units of PRBC. Stable for discharge to SNF.   Assessment & Plan:   Principal Problem:   Closed left hip fracture (HCC) Active Problems:   Interstitial lung disease (HCC)   Hypothyroidism   MCI (mild cognitive impairment)   Left hip fracture Patient suffered a fall and subsequent left hip fracture. Orthopedic surgery consulted on admission and performed IM nail placement on 8/28. Physical therapy recommended SNF. Partial weight bearing. Recommendation for return to clinic in 2 weeks; may need to have orthopedic surgery re-consult for post-op check while inpatient if remains admitted.  Interstitial lung disease Idiopathic pulmonary fibrosis Chronic respiratory failure with hypoxia Noted on history. Currently stable on room air. Patient follows with pulmonology as an outpatient. -Continue pirfenidone  Hypothyroidism -Continue Synthroid  Acute metabolic encephalopathy Noted in chart. Patient seems to have likely cognitive impairment. Appears to be confused but no encephalopathy currently.  Mild cognitive impairment Patient is on Aricept as an outpatient -Continue Aricept  Acute blood loss anemia Post-op bleeding Hemoglobin down to a low of 5.7 post-op. Patient has received 2 units of PRBC to date. Hemoglobin stable.  Acute urinary retention Patient initially managed with serial in/out catheterization required placement of foley catheter on 8/30 for short duration prior to her pulling out the catheter. No recurrent  retention.  Hyperlipidemia -Continue Lipitor  Hypotension Resolved.  Elevated AST/ALT Unsure of etiology. Seen on labs earlier on admission. No abdominal pain. -CMP today   DVT prophylaxis: Per orthopedic surgery: aspirin Code Status:   Code Status: Full Code Family Communication: None at bedside Disposition Plan: Discharge to SNF when bed is available. Stable for discharge.   Consultants:  Orthopedic surgery  Procedures:  IM NAIL PLACEMENT OF LET HIP (06/02/2021)  Antimicrobials: None    Subjective: Patient told the interpreter that she wanted milk. Did not answer any questions she was asked; just continued to request for warm milk.  Objective: Vitals:   06/19/21 0518 06/19/21 2029 06/20/21 0554 06/20/21 1300  BP: (!) 157/78 122/61 (!) 133/113 (!) 157/73  Pulse: 73 80 90 84  Resp: 18 16 16 18   Temp: (!) 97.5 F (36.4 C)   (!) 97 F (36.1 C)  TempSrc: Oral   Oral  SpO2: 100% 99% 98% 99%  Weight:      Height:        Intake/Output Summary (Last 24 hours) at 06/20/2021 1537 Last data filed at 06/20/2021 1103 Gross per 24 hour  Intake 360 ml  Output 550 ml  Net -190 ml    Filed Weights   05/31/21 2309 06/02/21 0233  Weight: 46.8 kg 46.8 kg    Examination:  General exam: Appears calm and comfortable Respiratory system: Clear to auscultation. Respiratory effort normal. Cardiovascular system: S1 & S2 heard, RRR. 2/6 systolic murmur Gastrointestinal system: Abdomen is nondistended, soft and nontender. No organomegaly or masses felt. Normal bowel sounds heard. Central nervous system: Alert. No focal neurological deficits. Musculoskeletal: No edema. No calf tenderness Skin: No cyanosis. No rashes    Data Reviewed: I have personally reviewed following labs and imaging  studies  CBC Lab Results  Component Value Date   WBC 11.5 (H) 06/05/2021   RBC 3.67 (L) 06/05/2021   HGB 11.6 (L) 06/05/2021   HCT 33.7 (L) 06/05/2021   MCV 91.8 06/05/2021   MCH 31.6  06/05/2021   PLT 295 06/05/2021   MCHC 34.4 06/05/2021   RDW 17.1 (H) 06/05/2021   LYMPHSABS 1.6 06/03/2021   MONOABS 1.3 (H) 06/03/2021   EOSABS 0.0 06/03/2021   BASOSABS 0.0 06/03/2021     Last metabolic panel Lab Results  Component Value Date   NA 130 (L) 06/20/2021   K 4.5 06/20/2021   CL 99 06/20/2021   CO2 25 06/20/2021   BUN 13 06/20/2021   CREATININE 0.71 06/20/2021   GLUCOSE 104 (H) 06/20/2021   GFRNONAA >60 06/20/2021   CALCIUM 8.5 (L) 06/20/2021   PROT 5.9 (L) 06/20/2021   ALBUMIN 2.4 (L) 06/20/2021   BILITOT 0.7 06/20/2021   ALKPHOS 147 (H) 06/20/2021   AST 67 (H) 06/20/2021   ALT 100 (H) 06/20/2021   ANIONGAP 6 06/20/2021    CBG (last 3)  No results for input(s): GLUCAP in the last 72 hours.   GFR: Estimated Creatinine Clearance: 32.3 mL/min (by C-G formula based on SCr of 0.71 mg/dL).  Coagulation Profile: No results for input(s): INR, PROTIME in the last 168 hours.  No results found for this or any previous visit (from the past 240 hour(s)).      Radiology Studies: No results found.      Scheduled Meds:  aspirin  81 mg Oral BID   atorvastatin  10 mg Oral Daily   donepezil  5 mg Oral QHS   levothyroxine  100 mcg Oral Once per day on Sun Fri Sat   levothyroxine  50 mcg Oral Once per day on Mon Tue Wed Thu   memantine  5 mg Oral Daily   Pirfenidone  801 mg Oral TID   Continuous Infusions:   LOS: 20 days     Jacquelin Hawking, MD [facility] 06/20/2021, 3:37 PM  If 7PM-7AM, please contact night-coverage [email or web address]

## 2021-06-20 NOTE — NC FL2 (Signed)
[place] MEDICAID FL2 LEVEL OF CARE SCREENING TOOL     IDENTIFICATION  Patient Name: Catherine Terrell Birthdate: 08-07-1933 Sex: female Admission Date (Current Location): 05/31/2021  [facility] and [place] Number:  Producer, television/film/video and Address:  [facility],  [address] [place], [place] [address]      Provider Number: [id number]  Attending Physician Name and Address:  Narda Bonds, MD  Relative Name and Phone Number:  son, Takiya Belmares [phone]    Current Level of Care: Hospital Recommended Level of Care: Skilled Nursing Facility Prior Approval Number:    Date Approved/Denied:   PASRR Number: [id number]  Discharge Plan: SNF    Current Diagnoses: Patient Active Problem List   Diagnosis Date Noted   Closed left hip fracture (HCC) 05/31/2021   Interstitial lung disease (HCC) 05/31/2021   Hypothyroidism 05/31/2021   MCI (mild cognitive impairment) 05/31/2021    Orientation RESPIRATION BLADDER Height & Weight     Self, Place  Normal Incontinent, External catheter Weight: 103 lb 2.8 oz (46.8 kg) Height:  4\' 8"  (142.2 cm)  BEHAVIORAL SYMPTOMS/MOOD NEUROLOGICAL BOWEL NUTRITION STATUS      Incontinent    AMBULATORY STATUS COMMUNICATION OF NEEDS Skin   Limited Assist  (Hindi; needs interpreter)  (surgical incision site)                       Personal Care Assistance Level of Assistance  Bathing, Dressing Bathing Assistance: Limited assistance   Dressing Assistance: Limited assistance     Functional Limitations Info             SPECIAL CARE FACTORS FREQUENCY  PT (By licensed PT), OT (By licensed OT)     PT Frequency: 5x/wk OT Frequency: 5x/wk            Contractures Contractures Info: Not present    Additional Factors Info  Code Status, Psychotropic Code Status Info: Full   Psychotropic Info: see MAR         Current Medications (06/20/2021):  This is the current hospital active medication list Current Facility-Administered  Medications  Medication Dose Route Frequency Provider Last Rate Last Admin   acetaminophen (TYLENOL) tablet 650 mg  650 mg Oral Q6H PRN Arrien, 06/22/2021, MD   650 mg at 06/20/21 0203   aspirin chewable tablet 81 mg  81 mg Oral BID 06/22/21, PA-C   81 mg at 06/19/21 2054   atorvastatin (LIPITOR) tablet 10 mg  10 mg Oral Daily 2055, PA-C   10 mg at 06/19/21 0815   donepezil (ARICEPT) tablet 5 mg  5 mg Oral QHS 06/21/21, PA-C   5 mg at 06/19/21 2054   HYDROcodone-acetaminophen (NORCO/VICODIN) 5-325 MG per tablet 1 tablet  1 tablet Oral Q6H PRN Arrien, 2055, MD   1 tablet at 06/15/21 2221   levothyroxine (SYNTHROID) tablet 100 mcg  100 mcg Oral Once per day on Sun Fri Sat 06-23-1974, PA-C   100 mcg at 06/16/21 08/16/21   levothyroxine (SYNTHROID) tablet 50 mcg  50 mcg Oral Once per day on Mon Tue Wed Thu 08-23-1973, PA-C   50 mcg at 06/20/21 0549   memantine (NAMENDA) tablet 5 mg  5 mg Oral Daily 06/22/21, PA-C   5 mg at 06/19/21 06/21/21   Pirfenidone TABS 801 mg  801 mg Oral TID 6546, PA-C   801 mg at 06/19/21 2105  Suvorexant TABS 5 mg  5 mg Oral QHS PRN Cassandria Anger, PA-C         Discharge Medications: Please see discharge summary for a list of discharge medications.  Relevant Imaging Results:  Relevant Lab Results:   Additional Information ss# [id number]  Amada Jupiter, LCSW

## 2021-06-20 NOTE — Progress Notes (Signed)
Physical Therapy Treatment Patient Details Name: MONIFA BLANCHETTE MRN: [id number] DOB: January 15, 1933 Today's Date: 06/20/2021   History of Present Illness 87 yo female adm 05/31/21 after fall resulting in L IT hip fx. Pt s/p s/p ORIF IM Nail 06/02/21.  PMH: bil TKAs (done in [place]), dementia, interstitial lung disease.    PT Comments    POD 18 Pt is progressing with her cognition and able to follow functional commands and knows some [place].  She was asking for "two milk" (warm).  Assisted OOB to [facility] still required + 2 assist.  Pt unable to understand PWB but self performs due to pain.  Pt required + 2 assist to amb.  General Gait Details: assisted with amb a short distance from [facility] back to bed + 2 side by side with axillary support to ensure PWB L LE.  Pt also required assist to correctly advance walker as she was pushing walker too far out front.  Required MAX Assist to complete turn to bed as pt tended to sit too early. Assisted back to bed and positioned to comfort.  Pillow placed L LE to prevent Internal Rotation.     Recommendations for follow up therapy are one component of a multi-disciplinary discharge planning process, led by the attending physician.  Recommendations may be updated based on patient status, additional functional criteria and insurance authorization.  Follow Up Recommendations  SNF     Equipment Recommendations  Rolling walker with 5" wheels;Wheelchair (measurements PT);Wheelchair cushion (measurements PT);Hospital bed;3in1 (PT)    Recommendations for Other Services       Precautions / Restrictions Precautions Precautions: ICD/Pacemaker Restrictions Weight Bearing Restrictions: Yes LLE Weight Bearing: Partial weight bearing LLE Partial Weight Bearing Percentage or Pounds: 50%     Mobility  Bed Mobility Overal bed mobility: Needs Assistance Bed Mobility: Supine to Sit;Sit to Supine     Supine to sit: Mod assist;HOB elevated Sit to supine: Max assist;+2 for  safety/equipment;+2 for physical assistance   General bed mobility comments: mod A for LE management and initial uprighting of trunk from elevated bed height.  Increased assist back to bed, then positioned to comfort esp to prevent L LE Internal Rotation.    Transfers Overall transfer level: Needs assistance Equipment used: 2 person hand held assist Transfers: Sit to/from [employer] Sit to Stand: Mod assist;From elevated surface;+2 safety/equipment Stand pivot transfers: Max assist       General transfer comment: Mod Assist + 2 side by side to complete 1/4 pivot from elevated bed to [facility].  Pt not able to comprehend PWB but did self PWB due to pain.  Ambulation/Gait Ambulation/Gait assistance: Max assist;+2 physical assistance;+2 safety/equipment Gait Distance (Feet): 8 Feet Assistive device: Rolling walker (2 wheeled) Gait Pattern/deviations: Step-to pattern;Trunk flexed;Narrow base of support Gait velocity: decr   General Gait Details: assisted with amb a short distance from [facility] back to bed + 2 side by side with axillary support to ensure PWB L LE.  Pt also required assist to correctly advance walker as she was pushing walker too far out front.  Required MAX Assist to complete turn to bed as pt tended to sit too early.   Stairs             Wheelchair Mobility    Modified Rankin (Stroke Patients Only)       Balance  Cognition Arousal/Alertness: Awake/alert Behavior During Therapy: WFL for tasks assessed/performed Overall Cognitive Status: Difficult to assess                                 General Comments: AxO x 2 able to follow simple commands and hand gestures.  She knows very little [place].  Did state she would like "two milk" (warm)      Exercises      General Comments        Pertinent Vitals/Pain Pain Assessment: Faces Pain Location: Lt hip/thigh during  standing Pain Descriptors / Indicators: Grimacing Pain Intervention(s): Monitored during session;Repositioned    Home Living                      Prior Function            PT Goals (current goals can now be found in the care plan section) Progress towards PT goals: Progressing toward goals    Frequency    Min 4X/week      PT Plan Current plan remains appropriate    Co-evaluation              AM-PAC PT "6 Clicks" Mobility   Outcome Measure  Help needed turning from your back to your side while in a flat bed without using bedrails?: A Lot Help needed moving from lying on your back to sitting on the side of a flat bed without using bedrails?: A Lot Help needed moving to and from a bed to a chair (including a wheelchair)?: A Lot Help needed standing up from a chair using your arms (e.g., wheelchair or bedside chair)?: A Lot Help needed to walk in hospital room?: Total Help needed climbing 3-5 steps with a railing? : Total 6 Click Score: 10    End of Session Equipment Utilized During Treatment: Gait belt Activity Tolerance: Patient limited by fatigue Patient left: in bed;with bed alarm set;with call bell/phone within reach Nurse Communication: Mobility status PT Visit Diagnosis: Other abnormalities of gait and mobility (R26.89)     Time: 6629-4765 PT Time Calculation (min) (ACUTE ONLY): 24 min  Charges:  $Gait Training: 8-22 mins $Therapeutic Activity: 8-22 mins                     Felecia Shelling  PTA Acute  Rehabilitation Services Pager      [phone] Office      [phone]

## 2021-06-21 ENCOUNTER — Inpatient Hospital Stay (HOSPITAL_COMMUNITY): Payer: Medicaid - Out of State

## 2021-06-21 DIAGNOSIS — G3184 Mild cognitive impairment, so stated: Secondary | ICD-10-CM | POA: Diagnosis not present

## 2021-06-21 DIAGNOSIS — S72002A Fracture of unspecified part of neck of left femur, initial encounter for closed fracture: Secondary | ICD-10-CM | POA: Diagnosis not present

## 2021-06-21 DIAGNOSIS — J849 Interstitial pulmonary disease, unspecified: Secondary | ICD-10-CM | POA: Diagnosis not present

## 2021-06-21 DIAGNOSIS — E039 Hypothyroidism, unspecified: Secondary | ICD-10-CM | POA: Diagnosis not present

## 2021-06-21 LAB — COMPREHENSIVE METABOLIC PANEL
ALT: 90 U/L — ABNORMAL HIGH (ref 0–44)
AST: 67 U/L — ABNORMAL HIGH (ref 15–41)
Albumin: 2.3 g/dL — ABNORMAL LOW (ref 3.5–5.0)
Alkaline Phosphatase: 138 U/L — ABNORMAL HIGH (ref 38–126)
Anion gap: 5 (ref 5–15)
BUN: 17 mg/dL (ref 8–23)
CO2: 26 mmol/L (ref 22–32)
Calcium: 8.5 mg/dL — ABNORMAL LOW (ref 8.9–10.3)
Chloride: 99 mmol/L (ref 98–111)
Creatinine, Ser: 0.81 mg/dL (ref 0.44–1.00)
GFR, Estimated: >60 mL/min (ref >60)
Glucose, Bld: 149 mg/dL — ABNORMAL HIGH (ref 70–99)
Potassium: 4.3 mmol/L (ref 3.5–5.1)
Sodium: 130 mmol/L — ABNORMAL LOW (ref 135–145)
Total Bilirubin: 0.4 mg/dL (ref 0.3–1.2)
Total Protein: 5.3 g/dL — ABNORMAL LOW (ref 6.5–8.1)

## 2021-06-21 NOTE — TOC Progression Note (Signed)
Transition of Care [facility]) - Progression Note    Patient Details  Name: ROMIE KEEBLE MRN: [id number] Date of Birth: [birth year]  Transition of [facility] CM/SW Contact  Amada Jupiter, LCSW Phone Number: 06/21/2021, 10:57 AM  Clinical Narrative:    TOC SW and TOC management continue to work on [employer] planning options which are few.  Attempting to work with any SNF who might consider for short stay until pt returns to [place].     Expected Discharge Plan: Home/Self Care Barriers to Discharge: Financial Resources, Inadequate or no insurance, Continued Medical Work up  Expected Discharge Plan and Services Expected Discharge Plan: Home/Self Care In-house Referral: Clinical Social Work                                             Social Determinants of Health (SDOH) Interventions    Readmission Risk Interventions No flowsheet data found.

## 2021-06-21 NOTE — Progress Notes (Signed)
PROGRESS NOTE    Catherine Terrell  MCN:[id number] DOB: Feb 18, 1933 DOA: 05/31/2021 PCP: System, Provider Not In   Brief Narrative: Catherine Terrell is a 87 y.o. female with a history of ILD, hypothyroidism, hyperlipidemia, cognitive impairment. Patient presented secondary to a fall, left hip pain and found to have a left hip fracture. Orthopedic surgery was consulted and performed IM nail placement. Hospitalization complicated by post-op bleeding requiring 2 units of PRBC. Stable for discharge to SNF.   Assessment & Plan:   Principal Problem:   Closed left hip fracture (HCC) Active Problems:   Interstitial lung disease (HCC)   Hypothyroidism   MCI (mild cognitive impairment)   Left hip fracture Patient suffered a fall and subsequent left hip fracture. Orthopedic surgery consulted on admission and performed IM nail placement on 8/28. Physical therapy recommended SNF. Partial weight bearing. Recommendation for return to clinic in 2 weeks; may need to have orthopedic surgery re-consult for post-op check while inpatient if remains admitted.  Interstitial lung disease Idiopathic pulmonary fibrosis Chronic respiratory failure with hypoxia Noted on history. Currently stable on room air. Patient follows with pulmonology as an outpatient. -Continue pirfenidone  Hypothyroidism -Continue Synthroid  Acute metabolic encephalopathy Noted in chart. Patient seems to have likely cognitive impairment. Appears to be confused but no encephalopathy currently.  Mild cognitive impairment Patient is on Aricept as an outpatient -Continue Aricept  Acute blood loss anemia Post-op bleeding Hemoglobin down to a low of 5.7 post-op. Patient has received 2 units of PRBC to date. Hemoglobin stable.  Acute urinary retention Patient initially managed with serial in/out catheterization required placement of foley catheter on 8/30 for short duration prior to her pulling out the catheter. No recurrent  retention.  Hyperlipidemia -Continue Lipitor  Hypotension Resolved.  Elevated AST/ALT Unsure of etiology. Seen on labs earlier on admission. No abdominal pain. -RUQ ultrasound   DVT prophylaxis: Per orthopedic surgery: aspirin Code Status:   Code Status: Full Code Family Communication: None at bedside Disposition Plan: Discharge to SNF when bed is available. Stable for discharge.   Consultants:  Orthopedic surgery  Procedures:  IM NAIL PLACEMENT OF LET HIP (06/02/2021)  Antimicrobials: None    Subjective: Language barrier. Several attempts prior to use interpreter services to communicate have failed. No issues noted overnight.  Objective: Vitals:   06/20/21 1300 06/20/21 2107 06/21/21 0438 06/21/21 1336  BP: (!) 157/73 (!) 140/95 (!) 152/71 140/64  Pulse: 84 80 83 80  Resp: 18 16 15 16   Temp: (!) 97 F (36.1 C) 98.6 F (37 C)  97.7 F (36.5 C)  TempSrc: Oral Oral  Oral  SpO2: 99% 99% 100% 99%  Weight:      Height:        Intake/Output Summary (Last 24 hours) at 06/21/2021 1450 Last data filed at 06/21/2021 1400 Gross per 24 hour  Intake 0 ml  Output 500 ml  Net -500 ml    Filed Weights   05/31/21 2309 06/02/21 0233  Weight: 46.8 kg 46.8 kg    Examination:  General exam: Appears comfortable  Respiratory system: Clear to auscultation. Respiratory effort normal. Cardiovascular system: S1 & S2 heard, RRR. 2/6 systolic murmur Gastrointestinal system: Abdomen is nondistended, soft and nontender. No organomegaly or masses felt. Normal bowel sounds heard. Central nervous system: Alert and oriented. No focal neurological deficits. Musculoskeletal: No edema. No calf tenderness Skin: No cyanosis. No rashes Psychiatry: Slightly agitated mood. Frustrated appearing.    Data Reviewed: I have personally reviewed following  labs and imaging studies  CBC Lab Results  Component Value Date   WBC 11.5 (H) 06/05/2021   RBC 3.67 (L) 06/05/2021   HGB 11.6 (L)  06/05/2021   HCT 33.7 (L) 06/05/2021   MCV 91.8 06/05/2021   MCH 31.6 06/05/2021   PLT 295 06/05/2021   MCHC 34.4 06/05/2021   RDW 17.1 (H) 06/05/2021   LYMPHSABS 1.6 06/03/2021   MONOABS 1.3 (H) 06/03/2021   EOSABS 0.0 06/03/2021   BASOSABS 0.0 06/03/2021     Last metabolic panel Lab Results  Component Value Date   NA 130 (L) 06/21/2021   K 4.3 06/21/2021   CL 99 06/21/2021   CO2 26 06/21/2021   BUN 17 06/21/2021   CREATININE 0.81 06/21/2021   GLUCOSE 149 (H) 06/21/2021   GFRNONAA >60 06/21/2021   CALCIUM 8.5 (L) 06/21/2021   PROT 5.3 (L) 06/21/2021   ALBUMIN 2.3 (L) 06/21/2021   BILITOT 0.4 06/21/2021   ALKPHOS 138 (H) 06/21/2021   AST 67 (H) 06/21/2021   ALT 90 (H) 06/21/2021   ANIONGAP 5 06/21/2021    CBG (last 3)  No results for input(s): GLUCAP in the last 72 hours.   GFR: Estimated Creatinine Clearance: 31.9 mL/min (by C-G formula based on SCr of 0.81 mg/dL).  Coagulation Profile: No results for input(s): INR, PROTIME in the last 168 hours.  No results found for this or any previous visit (from the past 240 hour(s)).      Radiology Studies: US Abdomen Limited RUQ (LIVER/GB)  Result Date: 06/21/2021 CLINICAL DATA:  Elevated LFTs EXAM: ULTRASOUND ABDOMEN LIMITED RIGHT UPPER QUADRANT COMPARISON:  None. FINDINGS: By sonographer report, examination is severely limited by patient body habitus, sonographic window, and inability to position. Gallbladder: Not visualized. Common bile duct: Not visualized. Liver: Very limited views of the liver parenchyma. Within this limitation, no focal lesion identified. Within normal limits in parenchymal echogenicity. Portal vein cannot be assessed for color Doppler flow. Other: None. IMPRESSION: Severely limited examination as described. Nonvisualization of the gallbladder, common bile duct, and main portal vein. No obvious abnormality of the liver parenchyma with limited visualization. Electronically Signed   By: Lauralyn Primes  M.D.   On: 06/21/2021 13:07        Scheduled Meds:  aspirin  81 mg Oral BID   atorvastatin  10 mg Oral Daily   donepezil  5 mg Oral QHS   levothyroxine  100 mcg Oral Once per day on Sun Fri Sat   levothyroxine  50 mcg Oral Once per day on Mon Tue Wed Thu   memantine  5 mg Oral Daily   Pirfenidone  801 mg Oral TID   Continuous Infusions:   LOS: 21 days     Jacquelin Hawking, MD [facility] 06/21/2021, 2:50 PM  If 7PM-7AM, please contact night-coverage [email or web address]

## 2021-06-22 DIAGNOSIS — S72002A Fracture of unspecified part of neck of left femur, initial encounter for closed fracture: Secondary | ICD-10-CM | POA: Diagnosis not present

## 2021-06-22 LAB — COMPREHENSIVE METABOLIC PANEL
ALT: 96 U/L — ABNORMAL HIGH (ref 0–44)
AST: 80 U/L — ABNORMAL HIGH (ref 15–41)
Albumin: 2.4 g/dL — ABNORMAL LOW (ref 3.5–5.0)
Alkaline Phosphatase: 142 U/L — ABNORMAL HIGH (ref 38–126)
Anion gap: 8 (ref 5–15)
BUN: 19 mg/dL (ref 8–23)
CO2: 25 mmol/L (ref 22–32)
Calcium: 8.7 mg/dL — ABNORMAL LOW (ref 8.9–10.3)
Chloride: 97 mmol/L — ABNORMAL LOW (ref 98–111)
Creatinine, Ser: 0.58 mg/dL (ref 0.44–1.00)
GFR, Estimated: >60 mL/min (ref >60)
Glucose, Bld: 115 mg/dL — ABNORMAL HIGH (ref 70–99)
Potassium: 4.4 mmol/L (ref 3.5–5.1)
Sodium: 130 mmol/L — ABNORMAL LOW (ref 135–145)
Total Bilirubin: 0.7 mg/dL (ref 0.3–1.2)
Total Protein: 5.7 g/dL — ABNORMAL LOW (ref 6.5–8.1)

## 2021-06-22 NOTE — Progress Notes (Signed)
PROGRESS NOTE    Catherine Terrell  QVZ:[id number] DOB: 08-May-1933 DOA: 05/31/2021 PCP: System, Provider Not In   Chief Complain: Fall  Brief Narrative:  Patient is a 87 year old female with history of interstitial lung disease, hypothyroidism, hyperlipidemia, cognitive impairment who presented secondary to fall.  After fall,she developed left hip pain.  Imaging showed left hip fracture.  Orthopedics consulted and underwent IM nailing.  Hospital course remarkable for acute blood loss anemia requiring 2 L units of PRBC.  PT/OT recommended skilled nursing facility on discharge but placement is difficult due to lack of insurance.  Patient unable to go back to home.  Medically stable for discharge.  TOC following  Assessment & Plan:   Principal Problem:   Closed left hip fracture (HCC) Active Problems:   Interstitial lung disease (HCC)   Hypothyroidism   MCI (mild cognitive impairment)   Left hip fracture: Secondary to fall in the bathroom.After fall,she developed left hip pain.  Imaging showed left hip fracture.  Orthopedics consulted and underwent IM nailing.  On aspirin twice daily for DVT prophylaxis.  Acute blood loss anemia: Postoperatively.  Underwent 2 units  of blood transfusion.  Currently hemoglobin stable  History of interstitial lung disease/COPD pulmonary fibrosis/chronic respiratory failure with hypoxia: Currently stable on room air.  Follows with pulm as an outpatient.  On pirfenidone  Hypothyroidism: On Synthyroid  Cognitive impairment/dementia: Confused at baseline.  On Aricept.  Monitor mental status.  Delirium precautions  Acute urinary retention: Resolved.  Foley taken out  Hyperlipidemia :on Lipitor  Mildly elevated liver enzymes: Unclear etiology.  No abdominal pain.  Ultrasound of the right upper quadrant did not show any obvious pathology of the liver parenchyma  Hyponatremia: Mild, continue to monitor  Disposition: Patient is originally from [place], lives  with her youngest son.  She came to visit her elder son.  At her son's place, she fell in the bathroom near commode and broke her hip.  She has insurance from [place] with does not work in [place].  Her son is not able to take care of her here.  That is why she had prolonged hospitalization.  Her youngest son in [place] is traveling and is currently not at home.  Plan is to go to [place] in October for when her son will be back.TOC is also looking for short-term rehab over here.         DVT prophylaxis:Aspirin Code Status: Full Family Communication: Son at bedside Status is: Inpatient  Remains inpatient appropriate because:Unsafe d/c plan  Dispo: The patient is from: Home              Anticipated d/c is to: SNF              Patient currently is medically stable for discharge   difficult to place patient Yes    Consultants: Orthopedics  Procedures:ORIF  Antimicrobials:  Anti-infectives (From admission, onward)    Start     Dose/Rate Route Frequency Ordered Stop   06/02/21 1830  ceFAZolin (ANCEF) IVPB 2g/100 mL premix        2 g 200 mL/hr over 30 Minutes Intravenous Every 6 hours 06/02/21 1454 06/03/21 0117   06/02/21 0830  ceFAZolin (ANCEF) IVPB 2g/100 mL premix        2 g 200 mL/hr over 30 Minutes Intravenous On call to O.R. 06/02/21 0744 06/02/21 1228   06/02/21 0600  ceFAZolin (ANCEF) IVPB 2g/100 mL premix  Status:  Discontinued  2 g 200 mL/hr over 30 Minutes Intravenous On call to O.R. 06/01/21 1333 06/02/21 0748       Subjective: Patient seen and examined the bedside this afternoon.  Son at the bedside.  Comfortable, hemodynamically stable  Objective: Vitals:   06/21/21 0438 06/21/21 1336 06/21/21 2049 06/22/21 0541  BP: (!) 152/71 140/64 139/86 (!) 146/71  Pulse: 83 80 82 77  Resp: 15 16 18 18   Temp:  97.7 F (36.5 C) 98.5 F (36.9 C) 99 F (37.2 C)  TempSrc:  Oral Oral Oral  SpO2: 100% 99% 98% 100%  Weight:      Height:         Intake/Output Summary (Last 24 hours) at 06/22/2021 1233 Last data filed at 06/22/2021 1000 Gross per 24 hour  Intake 1310 ml  Output 2000 ml  Net -690 ml   Filed Weights   05/31/21 2309 06/02/21 0233  Weight: 46.8 kg 46.8 kg    Examination:  General exam: Overall comfortable, not in distress, pleasant elderly female, pleasantly confused HEENT: PERRL Respiratory system:  no wheezes or crackles  Cardiovascular system: S1 & S2 heard, RRR.  Gastrointestinal system: Abdomen is nondistended, soft and nontender. Central nervous system: Alert and awake, not oriented to time Extremities: No edema, no clubbing ,no cyanosis Skin: No rashes, no ulcers,no icterus      Data Reviewed: I have personally reviewed following labs and imaging studies  CBC: No results for input(s): WBC, NEUTROABS, HGB, HCT, MCV, PLT in the last 168 hours. Basic Metabolic Panel: Recent Labs  Lab 06/20/21 1324 06/21/21 0355 06/22/21 0426  NA 130* 130* 130*  K 4.5 4.3 4.4  CL 99 99 97*  CO2 25 26 25   GLUCOSE 104* 149* 115*  BUN 13 17 19   CREATININE 0.71 0.81 0.58  CALCIUM 8.5* 8.5* 8.7*   GFR: Estimated Creatinine Clearance: 32.3 mL/min (by C-G formula based on SCr of 0.58 mg/dL). Liver Function Tests: Recent Labs  Lab 06/20/21 1324 06/21/21 0355 06/22/21 0426  AST 67* 67* 80*  ALT 100* 90* 96*  ALKPHOS 147* 138* 142*  BILITOT 0.7 0.4 0.7  PROT 5.9* 5.3* 5.7*  ALBUMIN 2.4* 2.3* 2.4*   No results for input(s): LIPASE, AMYLASE in the last 168 hours. No results for input(s): AMMONIA in the last 168 hours. Coagulation Profile: No results for input(s): INR, PROTIME in the last 168 hours. Cardiac Enzymes: No results for input(s): CKTOTAL, CKMB, CKMBINDEX, TROPONINI in the last 168 hours. BNP (last 3 results) No results for input(s): PROBNP in the last 8760 hours. HbA1C: No results for input(s): HGBA1C in the last 72 hours. CBG: No results for input(s): GLUCAP in the last 168 hours. Lipid  Profile: No results for input(s): CHOL, HDL, LDLCALC, TRIG, CHOLHDL, LDLDIRECT in the last 72 hours. Thyroid Function Tests: No results for input(s): TSH, T4TOTAL, FREET4, T3FREE, THYROIDAB in the last 72 hours. Anemia Panel: No results for input(s): VITAMINB12, FOLATE, FERRITIN, TIBC, IRON, RETICCTPCT in the last 72 hours. Sepsis Labs: No results for input(s): PROCALCITON, LATICACIDVEN in the last 168 hours.  No results found for this or any previous visit (from the past 240 hour(s)).       Radiology Studies: 06/22/21 Abdomen Limited RUQ (LIVER/GB)  Result Date: 06/21/2021 CLINICAL DATA:  Elevated LFTs EXAM: ULTRASOUND ABDOMEN LIMITED RIGHT UPPER QUADRANT COMPARISON:  None. FINDINGS: By sonographer report, examination is severely limited by patient body habitus, sonographic window, and inability to position. Gallbladder: Not visualized. Common bile duct: Not visualized. Liver:  Very limited views of the liver parenchyma. Within this limitation, no focal lesion identified. Within normal limits in parenchymal echogenicity. Portal vein cannot be assessed for color Doppler flow. Other: None. IMPRESSION: Severely limited examination as described. Nonvisualization of the gallbladder, common bile duct, and main portal vein. No obvious abnormality of the liver parenchyma with limited visualization. Electronically Signed   By: Lauralyn Primes M.D.   On: 06/21/2021 13:07        Scheduled Meds:  aspirin  81 mg Oral BID   atorvastatin  10 mg Oral Daily   donepezil  5 mg Oral QHS   levothyroxine  100 mcg Oral Once per day on Sun Fri Sat   levothyroxine  50 mcg Oral Once per day on Mon Tue Wed Thu   memantine  5 mg Oral Daily   Pirfenidone  801 mg Oral TID   Continuous Infusions:   LOS: 22 days    Time spent: 25 mins.More than 50% of that time was spent in counseling and/or coordination of care.      Burnadette Pop, MD [facility] P9/17/2022, 12:33 PM

## 2021-06-22 NOTE — Progress Notes (Addendum)
Physical Therapy Treatment Patient Details Name: Catherine Terrell MRN: [id number] DOB: August 19, 1933 Today's Date: 06/22/2021   History of Present Illness 87 yo female adm 05/31/21 after fall resulting in L IT hip fx. Pt s/p s/p ORIF IM Nail 06/02/21.  PMH: bil TKAs (done in [place]), dementia, interstitial lung disease.    PT Comments    Pt known to me from previous sessions. She appears more deconditioned and fatigues more rapidly than when I saw her 2 wks ago.   Pt able to amb into hallway, chair follow needed d/t rapid fatigue. Pt with incr internal rotation LLE and  scissoring at times. Mod to max assist for amb.  Recommend pt discontinue purewick and mobilize with staff to [facility]    Recommendations for follow up therapy are one component of a multi-disciplinary discharge planning process, led by the attending physician.  Recommendations may be updated based on patient status, additional functional criteria and insurance authorization.  Follow Up Recommendations  SNF (difficult placement)     Equipment Recommendations  Rolling walker with 5" wheels;Wheelchair (measurements PT);Wheelchair cushion (measurements PT);3in1 (PT)    Recommendations for Other Services       Precautions / Restrictions Precautions Precautions: Fall Restrictions LLE Weight Bearing: Partial weight bearing     Mobility  Bed Mobility Overal bed mobility: Needs Assistance Bed Mobility: Supine to Sit     Supine to sit: Min assist     General bed mobility comments: visual cues, trunk guided to upright with pt holding PT's hand    Transfers Overall transfer level: Needs assistance Equipment used: Rolling walker (2 wheeled) Transfers: Sit to/from Stand Sit to Stand: Mod assist;Min assist         General transfer comment: repeated x2. pt with incr effort and decr assist required on second trial; hand over hand cues for hand placement. assist to position LLE/hip in neutral, tends to internally  rotate  Ambulation/Gait Ambulation/Gait assistance: Mod assist;Max assist Gait Distance (Feet): 25 Feet Assistive device: Rolling walker (2 wheeled) Gait Pattern/deviations: Step-to pattern;Trunk flexed;Narrow base of support;Scissoring Gait velocity: decr   General Gait Details: pt fatigues easily, requries encouragement to continue. assist for balance throughout, to maneuver RW. pt with noted internal rotation L hip and scissoring at times.   Stairs             Wheelchair Mobility    Modified Rankin (Stroke Patients Only)       Balance   Sitting-balance support: Feet supported Sitting balance-Leahy Scale: Fair Sitting balance - Comments: able to static sit EOB without support   Standing balance support: Bilateral upper extremity supported;During functional activity Standing balance-Leahy Scale: Poor Standing balance comment: relies on BUE support for static stand, initial posterior lean. external assist needed for dynamic                            Cognition Arousal/Alertness: Awake/alert Behavior During Therapy: WFL for tasks assessed/performed Overall Cognitive Status: Difficult to assess                                 General Comments: AxO x 2 able to follow simple commands and hand gestures.  pt speaks and comprehends  some [place]; pt son present today for interpreting      Exercises General Exercises - Lower Extremity Ankle Circles/Pumps: AROM;10 reps;Both LAQs LLE x10    General Comments  Pertinent Vitals/Pain Pain Assessment: Faces Faces Pain Scale: Hurts a little bit Pain Location: Lt hip/thigh during standing Pain Descriptors / Indicators: Grimacing Pain Intervention(s): Limited activity within patient's tolerance;Monitored during session;Repositioned    Home Living                      Prior Function            PT Goals (current goals can now be found in the care plan section) Acute Rehab PT  Goals Patient Stated Goal: "breakfast" PT Goal Formulation: Patient unable to participate in goal setting Time For Goal Achievement: 06/26/21 Potential to Achieve Goals: Fair Progress towards PT goals: Progressing toward goals    Frequency    Min 5X/week      PT Plan Current plan remains appropriate    Co-evaluation              AM-PAC PT "6 Clicks" Mobility   Outcome Measure  Help needed turning from your back to your side while in a flat bed without using bedrails?: A Lot Help needed moving from lying on your back to sitting on the side of a flat bed without using bedrails?: A Lot Help needed moving to and from a bed to a chair (including a wheelchair)?: A Lot Help needed standing up from a chair using your arms (e.g., wheelchair or bedside chair)?: A Lot Help needed to walk in hospital room?: Total Help needed climbing 3-5 steps with a railing? : Total 6 Click Score: 10    End of Session Equipment Utilized During Treatment: Gait belt Activity Tolerance: Patient limited by fatigue Patient left: in chair;with call bell/phone within reach;with chair alarm set   PT Visit Diagnosis: Other abnormalities of gait and mobility (R26.89)     Time: 6629-4765 PT Time Calculation (min) (ACUTE ONLY): 23 min  Charges:  $Gait Training: 23-37 mins                     Delice Bison, PT  Acute Rehab Dept ([facility]) [phone] Pager [phone]  06/22/2021    [facility] 06/22/2021, 4:11 PM

## 2021-06-23 DIAGNOSIS — S72002A Fracture of unspecified part of neck of left femur, initial encounter for closed fracture: Secondary | ICD-10-CM | POA: Diagnosis not present

## 2021-06-23 NOTE — Progress Notes (Signed)
Physical Therapy Treatment Patient Details Name: Catherine Terrell MRN: [id number] DOB: November 25, 1932 Today's Date: 06/23/2021   History of Present Illness 87 yo female adm 05/31/21 after fall resulting in L IT hip fx. Pt s/p s/p ORIF IM Nail 06/02/21.  PMH: bil TKAs (done in [place]), dementia, interstitial lung disease.    PT Comments    Pt had purewick back in place today---continue to request pt use BSC and avoid purewick as pt is becming more and more deconditioned d/t lack of mobility with staff. Son reports pt asleep when he arrived around noon.  Pt requires encouragement and fatigues rapidly. Incr pt effort with transfers only requiring min assist with 3 sit<>stand trials. Incr gait distance as well however requiring mod assist for gait. Continue PT POC  Pt must mobilize with staff in addition to PT   Recommendations for follow up therapy are one component of a multi-disciplinary discharge planning process, led by the attending physician.  Recommendations may be updated based on patient status, additional functional criteria and insurance authorization.  Follow Up Recommendations  SNF (difficult placement)     Equipment Recommendations  Rolling walker with 5" wheels    Recommendations for Other Services       Precautions / Restrictions Precautions Precautions: Fall Restrictions Weight Bearing Restrictions: No LLE Weight Bearing: Weight bearing as tolerated     Mobility  Bed Mobility Overal bed mobility: Needs Assistance Bed Mobility: Supine to Sit     Supine to sit: Min assist;Mod assist     General bed mobility comments: visual cues, trunk guided to upright with pt holding PT and tech's hands    Transfers Overall transfer level: Needs assistance Equipment used: Rolling walker (2 wheeled) Transfers: Sit to/from Stand Sit to Stand: Min assist         General transfer comment: repeated x3 pt with incr effort and decr assist required on second trial; hand over hand  cues for hand placement. assist to position LLE/hip in neutral, tends to internally rotate however improved from last session  Ambulation/Gait Ambulation/Gait assistance: Mod assist Gait Distance (Feet): 20 Feet (10') Assistive device: Rolling walker (2 wheeled) Gait Pattern/deviations: Step-to pattern;Trunk flexed;Narrow base of support;Scissoring Gait velocity: decr   General Gait Details: pt fatigues easily, requries encouragement to continue. assist for balance throughout, to maneuver RW. pt with noted internal rotation L hip and near scissoring at times; pt is extremely dyspneic after amb, all VSS   Stairs             Wheelchair Mobility    Modified Rankin (Stroke Patients Only)       Balance     Sitting balance-Leahy Scale: Fair     Standing balance support: Bilateral upper extremity supported;During functional activity Standing balance-Leahy Scale: Poor Standing balance comment: relies on BUE support for static stand, initial posterior lean. external assist needed for dynamic                            Cognition Arousal/Alertness: Awake/alert Behavior During Therapy: WFL for tasks assessed/performed Overall Cognitive Status: Difficult to assess                                 General Comments: AxO x 2 able to follow simple commands and hand gestures.  pt speaks and comprehends  some [place]; pt son present today for interpreting  Exercises Other Exercises Other Exercises: gentle stretching L inner thigh, pt dnied pain but reported LLE felt better after stretches Other Exercises: pt doing ankle pumps actively on her own    General Comments        Pertinent Vitals/Pain Pain Assessment: Faces Faces Pain Scale: Hurts a little bit Pain Location: Lt hip/thigh during standing Pain Descriptors / Indicators: Grimacing Pain Intervention(s): Limited activity within patient's tolerance;Monitored during session;Repositioned     Home Living                      Prior Function            PT Goals (current goals can now be found in the care plan section) Acute Rehab PT Goals Patient Stated Goal: "breakfast" PT Goal Formulation: Patient unable to participate in goal setting Time For Goal Achievement: 06/26/21 Potential to Achieve Goals: Fair Progress towards PT goals: Progressing toward goals    Frequency    Min 5X/week      PT Plan Current plan remains appropriate    Co-evaluation              AM-PAC PT "6 Clicks" Mobility   Outcome Measure  Help needed turning from your back to your side while in a flat bed without using bedrails?: A Lot Help needed moving from lying on your back to sitting on the side of a flat bed without using bedrails?: A Lot Help needed moving to and from a bed to a chair (including a wheelchair)?: A Lot Help needed standing up from a chair using your arms (e.g., wheelchair or bedside chair)?: A Lot Help needed to walk in hospital room?: A Lot Help needed climbing 3-5 steps with a railing? : Total 6 Click Score: 11    End of Session Equipment Utilized During Treatment: Gait belt Activity Tolerance: Patient limited by fatigue Patient left: in chair;with call bell/phone within reach;with chair alarm set Nurse Communication: Mobility status PT Visit Diagnosis: Other abnormalities of gait and mobility (R26.89)     Time: 1328-1401 PT Time Calculation (min) (ACUTE ONLY): 33 min  Charges:  $Gait Training: 23-37 mins                     Delice Bison, PT  Acute Rehab Dept ([facility]) [phone] Pager [phone]  06/23/2021    [facility] 06/23/2021, 4:49 PM

## 2021-06-23 NOTE — Progress Notes (Signed)
PROGRESS NOTE    Catherine Terrell  FGH:[id number] DOB: Feb 12, 1933 DOA: 05/31/2021 PCP: System, Provider Not In   Chief Complain: Fall  Brief Narrative:  Patient is a 87 year old female with history of interstitial lung disease, hypothyroidism, hyperlipidemia, cognitive impairment who presented secondary to fall.  After fall,she developed left hip pain.  Imaging showed left hip fracture.  Orthopedics consulted and underwent IM nailing.  Hospital course remarkable for acute blood loss anemia requiring 2 L units of PRBC.  PT/OT recommended skilled nursing facility on discharge but placement is difficult due to lack of insurance.  Patient unable to go back to home.  Medically stable for discharge.  TOC following  Assessment & Plan:   Principal Problem:   Closed left hip fracture (HCC) Active Problems:   Interstitial lung disease (HCC)   Hypothyroidism   MCI (mild cognitive impairment)   Left hip fracture: Secondary to fall in the bathroom.After fall,she developed left hip pain.  Imaging showed left hip fracture.  Orthopedics consulted and underwent IM nailing.  On aspirin twice daily for DVT prophylaxis.  Acute blood loss anemia: Postoperatively.  Underwent 2 units  of blood transfusion.  Currently hemoglobin stable  History of interstitial lung disease/COPD pulmonary fibrosis/chronic respiratory failure with hypoxia: Currently stable on room air.  Follows with pulm as an outpatient.  On pirfenidone  Hypothyroidism: On Synthyroid  Cognitive impairment/dementia: Confused at baseline.  On Aricept.  Monitor mental status.  Delirium precautions  Acute urinary retention: Resolved.  Foley taken out  Hyperlipidemia :on Lipitor  Mildly elevated liver enzymes: Unclear etiology.  No abdominal pain.  Ultrasound of the right upper quadrant did not show any obvious pathology of the liver parenchyma  Hyponatremia: Mild, continue to monitor  Disposition: Patient is originally from [place], lives  with her youngest son.  She came to visit her elder son.  At her son's place, she fell in the bathroom near commode and broke her hip.  She has insurance from [place] with does not work in [place].  Her son is not able to take care of her here.  That is why she had prolonged hospitalization.  Her youngest son in [place] is traveling and is currently not at home.  Plan is to go to [place] in October for when her son will be back.TOC is also looking for short-term rehab over here.         DVT prophylaxis:Aspirin Code Status: Full Family Communication: Son at bedside on 06/22/21 Status is: Inpatient  Remains inpatient appropriate because:Unsafe d/c plan  Dispo: The patient is from: Home              Anticipated d/c is to: SNF              Patient currently is medically stable for discharge   difficult to place patient Yes    Consultants: Orthopedics  Procedures:ORIF  Antimicrobials:  Anti-infectives (From admission, onward)    Start     Dose/Rate Route Frequency Ordered Stop   06/02/21 1830  ceFAZolin (ANCEF) IVPB 2g/100 mL premix        2 g 200 mL/hr over 30 Minutes Intravenous Every 6 hours 06/02/21 1454 06/03/21 0117   06/02/21 0830  ceFAZolin (ANCEF) IVPB 2g/100 mL premix        2 g 200 mL/hr over 30 Minutes Intravenous On call to O.R. 06/02/21 0744 06/02/21 1228   06/02/21 0600  ceFAZolin (ANCEF) IVPB 2g/100 mL premix  Status:  Discontinued  2 g 200 mL/hr over 30 Minutes Intravenous On call to O.R. 06/01/21 1333 06/02/21 0748       Subjective: Patient seen at the bedside this morning.  She was comfortably sleeping on the bed.  Objective: Vitals:   06/22/21 0541 06/22/21 1356 06/22/21 2135 06/23/21 0557  BP: (!) 146/71 115/65 133/71 (!) 141/84  Pulse: 77 95 82 83  Resp: 18 17 17 17   Temp: 99 F (37.2 C)  97.7 F (36.5 C) (!) 97.4 F (36.3 C)  TempSrc: Oral  Oral   SpO2: 100% 97% 100% 98%  Weight:      Height:        Intake/Output  Summary (Last 24 hours) at 06/23/2021 0741 Last data filed at 06/23/2021 0700 Gross per 24 hour  Intake 830 ml  Output 1350 ml  Net -520 ml   Filed Weights   05/31/21 2309 06/02/21 0233  Weight: 46.8 kg 46.8 kg    Examination:  General exam: Looks comfortable, sleeping, not in any kind of distress,   Data Reviewed: I have personally reviewed following labs and imaging studies  CBC: No results for input(s): WBC, NEUTROABS, HGB, HCT, MCV, PLT in the last 168 hours. Basic Metabolic Panel: Recent Labs  Lab 06/20/21 1324 06/21/21 0355 06/22/21 0426  NA 130* 130* 130*  K 4.5 4.3 4.4  CL 99 99 97*  CO2 25 26 25   GLUCOSE 104* 149* 115*  BUN 13 17 19   CREATININE 0.71 0.81 0.58  CALCIUM 8.5* 8.5* 8.7*   GFR: Estimated Creatinine Clearance: 32.3 mL/min (by C-G formula based on SCr of 0.58 mg/dL). Liver Function Tests: Recent Labs  Lab 06/20/21 1324 06/21/21 0355 06/22/21 0426  AST 67* 67* 80*  ALT 100* 90* 96*  ALKPHOS 147* 138* 142*  BILITOT 0.7 0.4 0.7  PROT 5.9* 5.3* 5.7*  ALBUMIN 2.4* 2.3* 2.4*   No results for input(s): LIPASE, AMYLASE in the last 168 hours. No results for input(s): AMMONIA in the last 168 hours. Coagulation Profile: No results for input(s): INR, PROTIME in the last 168 hours. Cardiac Enzymes: No results for input(s): CKTOTAL, CKMB, CKMBINDEX, TROPONINI in the last 168 hours. BNP (last 3 results) No results for input(s): PROBNP in the last 8760 hours. HbA1C: No results for input(s): HGBA1C in the last 72 hours. CBG: No results for input(s): GLUCAP in the last 168 hours. Lipid Profile: No results for input(s): CHOL, HDL, LDLCALC, TRIG, CHOLHDL, LDLDIRECT in the last 72 hours. Thyroid Function Tests: No results for input(s): TSH, T4TOTAL, FREET4, T3FREE, THYROIDAB in the last 72 hours. Anemia Panel: No results for input(s): VITAMINB12, FOLATE, FERRITIN, TIBC, IRON, RETICCTPCT in the last 72 hours. Sepsis Labs: No results for input(s):  PROCALCITON, LATICACIDVEN in the last 168 hours.  No results found for this or any previous visit (from the past 240 hour(s)).       Radiology Studies: 06/22/21 Abdomen Limited RUQ (LIVER/GB)  Result Date: 06/21/2021 CLINICAL DATA:  Elevated LFTs EXAM: ULTRASOUND ABDOMEN LIMITED RIGHT UPPER QUADRANT COMPARISON:  None. FINDINGS: By sonographer report, examination is severely limited by patient body habitus, sonographic window, and inability to position. Gallbladder: Not visualized. Common bile duct: Not visualized. Liver: Very limited views of the liver parenchyma. Within this limitation, no focal lesion identified. Within normal limits in parenchymal echogenicity. Portal vein cannot be assessed for color Doppler flow. Other: None. IMPRESSION: Severely limited examination as described. Nonvisualization of the gallbladder, common bile duct, and main portal vein. No obvious abnormality of the liver  parenchyma with limited visualization. Electronically Signed   By: Lauralyn Primes M.D.   On: 06/21/2021 13:07        Scheduled Meds:  aspirin  81 mg Oral BID   atorvastatin  10 mg Oral Daily   donepezil  5 mg Oral QHS   levothyroxine  100 mcg Oral Once per day on Sun Fri Sat   levothyroxine  50 mcg Oral Once per day on Mon Tue Wed Thu   memantine  5 mg Oral Daily   Pirfenidone  801 mg Oral TID   Continuous Infusions:   LOS: 23 days    Time spent: 25 mins.More than 50% of that time was spent in counseling and/or coordination of care.      Burnadette Pop, MD [facility] P9/18/2022, 7:41 AM

## 2021-06-24 DIAGNOSIS — S72002A Fracture of unspecified part of neck of left femur, initial encounter for closed fracture: Secondary | ICD-10-CM | POA: Diagnosis not present

## 2021-06-24 NOTE — Progress Notes (Signed)
PROGRESS NOTE    Catherine Terrell  FGH:[id number] DOB: Feb 12, 1933 DOA: 05/31/2021 PCP: System, Provider Not In   Chief Complain: Fall  Brief Narrative:  Patient is a 87 year old female with history of interstitial lung disease, hypothyroidism, hyperlipidemia, cognitive impairment who presented secondary to fall.  After fall,she developed left hip pain.  Imaging showed left hip fracture.  Orthopedics consulted and underwent IM nailing.  Hospital course remarkable for acute blood loss anemia requiring 2 L units of PRBC.  PT/OT recommended skilled nursing facility on discharge but placement is difficult due to lack of insurance.  Patient unable to go back to home.  Medically stable for discharge.  TOC following  Assessment & Plan:   Principal Problem:   Closed left hip fracture (HCC) Active Problems:   Interstitial lung disease (HCC)   Hypothyroidism   MCI (mild cognitive impairment)   Left hip fracture: Secondary to fall in the bathroom.After fall,she developed left hip pain.  Imaging showed left hip fracture.  Orthopedics consulted and underwent IM nailing.  On aspirin twice daily for DVT prophylaxis.  Acute blood loss anemia: Postoperatively.  Underwent 2 units  of blood transfusion.  Currently hemoglobin stable  History of interstitial lung disease/COPD pulmonary fibrosis/chronic respiratory failure with hypoxia: Currently stable on room air.  Follows with pulm as an outpatient.  On pirfenidone  Hypothyroidism: On Synthyroid  Cognitive impairment/dementia: Confused at baseline.  On Aricept.  Monitor mental status.  Delirium precautions  Acute urinary retention: Resolved.  Foley taken out  Hyperlipidemia :on Lipitor  Mildly elevated liver enzymes: Unclear etiology.  No abdominal pain.  Ultrasound of the right upper quadrant did not show any obvious pathology of the liver parenchyma  Hyponatremia: Mild, continue to monitor  Disposition: Patient is originally from [place], lives  with her youngest son.  She came to visit her elder son.  At her son's place, she fell in the bathroom near commode and broke her hip.  She has insurance from [place] with does not work in [place].  Her son is not able to take care of her here.  That is why she had prolonged hospitalization.  Her youngest son in [place] is traveling and is currently not at home.  Plan is to go to [place] in October for when her son will be back.TOC is also looking for short-term rehab over here.         DVT prophylaxis:Aspirin Code Status: Full Family Communication: Son at bedside on 06/22/21 Status is: Inpatient  Remains inpatient appropriate because:Unsafe d/c plan  Dispo: The patient is from: Home              Anticipated d/c is to: SNF              Patient currently is medically stable for discharge   difficult to place patient Yes    Consultants: Orthopedics  Procedures:ORIF  Antimicrobials:  Anti-infectives (From admission, onward)    Start     Dose/Rate Route Frequency Ordered Stop   06/02/21 1830  ceFAZolin (ANCEF) IVPB 2g/100 mL premix        2 g 200 mL/hr over 30 Minutes Intravenous Every 6 hours 06/02/21 1454 06/03/21 0117   06/02/21 0830  ceFAZolin (ANCEF) IVPB 2g/100 mL premix        2 g 200 mL/hr over 30 Minutes Intravenous On call to O.R. 06/02/21 0744 06/02/21 1228   06/02/21 0600  ceFAZolin (ANCEF) IVPB 2g/100 mL premix  Status:  Discontinued  2 g 200 mL/hr over 30 Minutes Intravenous On call to O.R. 06/01/21 1333 06/02/21 0748       Subjective: Patient seen and examined the bedside this morning.  Hemodynamically stable.,  Comfortably sleeping Objective: Vitals:   06/23/21 0557 06/23/21 1411 06/23/21 2040 06/24/21 0445  BP: (!) 141/84 105/60 (!) 141/79 (!) 154/72  Pulse: 83 79 83 92  Resp: 17 17 16 19   Temp: (!) 97.4 F (36.3 C) 98.8 F (37.1 C)  98 F (36.7 C)  TempSrc:    Oral  SpO2: 98% 98% 100% 100%  Weight:      Height:         Intake/Output Summary (Last 24 hours) at 06/24/2021 0748 Last data filed at 06/24/2021 0600 Gross per 24 hour  Intake 320 ml  Output 825 ml  Net -505 ml   Filed Weights   05/31/21 2309 06/02/21 0233  Weight: 46.8 kg 46.8 kg    Examination:  General exam: Comfortable, sleeping, not in any kind of distress   Data Reviewed: I have personally reviewed following labs and imaging studies  CBC: No results for input(s): WBC, NEUTROABS, HGB, HCT, MCV, PLT in the last 168 hours. Basic Metabolic Panel: Recent Labs  Lab 06/20/21 1324 06/21/21 0355 06/22/21 0426  NA 130* 130* 130*  K 4.5 4.3 4.4  CL 99 99 97*  CO2 25 26 25   GLUCOSE 104* 149* 115*  BUN 13 17 19   CREATININE 0.71 0.81 0.58  CALCIUM 8.5* 8.5* 8.7*   GFR: Estimated Creatinine Clearance: 32.3 mL/min (by C-G formula based on SCr of 0.58 mg/dL). Liver Function Tests: Recent Labs  Lab 06/20/21 1324 06/21/21 0355 06/22/21 0426  AST 67* 67* 80*  ALT 100* 90* 96*  ALKPHOS 147* 138* 142*  BILITOT 0.7 0.4 0.7  PROT 5.9* 5.3* 5.7*  ALBUMIN 2.4* 2.3* 2.4*   No results for input(s): LIPASE, AMYLASE in the last 168 hours. No results for input(s): AMMONIA in the last 168 hours. Coagulation Profile: No results for input(s): INR, PROTIME in the last 168 hours. Cardiac Enzymes: No results for input(s): CKTOTAL, CKMB, CKMBINDEX, TROPONINI in the last 168 hours. BNP (last 3 results) No results for input(s): PROBNP in the last 8760 hours. HbA1C: No results for input(s): HGBA1C in the last 72 hours. CBG: No results for input(s): GLUCAP in the last 168 hours. Lipid Profile: No results for input(s): CHOL, HDL, LDLCALC, TRIG, CHOLHDL, LDLDIRECT in the last 72 hours. Thyroid Function Tests: No results for input(s): TSH, T4TOTAL, FREET4, T3FREE, THYROIDAB in the last 72 hours. Anemia Panel: No results for input(s): VITAMINB12, FOLATE, FERRITIN, TIBC, IRON, RETICCTPCT in the last 72 hours. Sepsis Labs: No results for  input(s): PROCALCITON, LATICACIDVEN in the last 168 hours.  No results found for this or any previous visit (from the past 240 hour(s)).       Radiology Studies: No results found.      Scheduled Meds:  aspirin  81 mg Oral BID   atorvastatin  10 mg Oral Daily   donepezil  5 mg Oral QHS   levothyroxine  100 mcg Oral Once per day on Sun Fri Sat   levothyroxine  50 mcg Oral Once per day on Mon Tue Wed Thu   memantine  5 mg Oral Daily   Pirfenidone  801 mg Oral TID   Continuous Infusions:   LOS: 24 days    Time spent: 15 mins.More than 50% of that time was spent in counseling and/or coordination of care.  Burnadette Pop, MD [facility] P9/19/2022, 7:48 AM

## 2021-06-24 NOTE — TOC Progression Note (Signed)
Transition of Care [facility]) - Progression Note   Patient Details  Name: Catherine Terrell MRN: [id number] Date of Birth: 1933/10/02  Transition of Care [facility]) CM/SW Contact  Ewing Schlein, LCSW Phone Number: 06/24/2021, 1:04 PM  Clinical Narrative: CSW spoke with Velna Hatchet at [facility] to follow up regarding referral for SNF. Per Velna Hatchet, she wants to confirm with the patient's son that the discharge plan is in fact for the patient to discharge home back to [place] after rehab and that the LOG, with what it will cover, has been approved. TOC to follow.  Expected Discharge Plan: Skilled Nursing Facility Barriers to Discharge: Financial Resources, Inadequate or no insurance, Continued Medical Work up  Expected Discharge Plan and Services Expected Discharge Plan: Skilled Nursing Facility In-house Referral: Clinical Social Work Post Acute Care Choice: Skilled Nursing Facility  Readmission Risk Interventions No flowsheet data found.

## 2021-06-24 NOTE — Progress Notes (Signed)
Physical Therapy Treatment Patient Details Name: Catherine Terrell MRN: [id number] DOB: August 09, 1933 Today's Date: 06/24/2021   History of Present Illness 87 yo female adm 05/31/21 after fall resulting in L IT hip fx. Pt s/p s/p ORIF IM Nail 06/02/21.  PMH: bil TKAs (done in [place]), dementia, interstitial lung disease.    PT Comments    Pt progressing toward goals. Incr tolerance to gait/activity today. Pt cooperative and able to participate/follow commands with visual cues    Recommendations for follow up therapy are one component of a multi-disciplinary discharge planning process, led by the attending physician.  Recommendations may be updated based on patient status, additional functional criteria and insurance authorization.  Follow Up Recommendations  SNF (difficult placement)     Equipment Recommendations  Rolling walker with 5" wheels    Recommendations for Other Services       Precautions / Restrictions Precautions Precautions: Fall Restrictions Weight Bearing Restrictions: No LLE Weight Bearing: Weight bearing as tolerated     Mobility  Bed Mobility Overal bed mobility: Needs Assistance Bed Mobility: Supine to Sit     Supine to sit: Min assist     General bed mobility comments: visual cues, trunk guided to upright with pt holding PT's hand    Transfers Overall transfer level: Needs assistance Equipment used: Rolling walker (2 wheeled) Transfers: Sit to/from Stand Sit to Stand: Min assist         General transfer comment: repeated x3 pt with incr effort and decr assist required on second trial; hand over hand cues for hand placement. assist to position LLE/hip in neutral, tends to internally rotate however improved from last session  Ambulation/Gait Ambulation/Gait assistance: Mod assist Gait Distance (Feet): 20 Feet (10' more) Assistive device: Rolling walker (2 wheeled) Gait Pattern/deviations: Step-to pattern;Trunk flexed;Narrow base of support Gait  velocity: decr   General Gait Details: pt fatigues easily, requries encouragement to continue. assist for balance throughout, to maneuver RW. pt with noted internal rotation L hip however with decr scissoring ; pt is  dyspneic after amb   Stairs             Wheelchair Mobility    Modified Rankin (Stroke Patients Only)       Balance     Sitting balance-Leahy Scale: Fair     Standing balance support: Bilateral upper extremity supported;During functional activity Standing balance-Leahy Scale: Poor Standing balance comment: relies on BUE support for static stand, initial posterior lean. external assist needed for dynamic                            Cognition Arousal/Alertness: Awake/alert Behavior During Therapy: WFL for tasks assessed/performed Overall Cognitive Status: Difficult to assess                                 General Comments: AxO x 2 able to follow simple commands and hand gestures.  pt speaks and comprehends  some [place]; pt son present today for interpreting      Exercises General Exercises - Lower Extremity Ankle Circles/Pumps: AROM;10 reps;Both Heel Slides: AAROM;Left;10 reps Other Exercises Other Exercises: gentle stretching L inner thigh, pt dnied pain but reported LLE felt better after stretches    General Comments        Pertinent Vitals/Pain Pain Assessment: Faces Faces Pain Scale: Hurts a little bit Pain Location: Lt hip/thigh during standing Pain Descriptors /  Indicators: Grimacing Pain Intervention(s): Limited activity within patient's tolerance;Monitored during session    Home Living                      Prior Function            PT Goals (current goals can now be found in the care plan section) Acute Rehab PT Goals PT Goal Formulation: Patient unable to participate in goal setting Time For Goal Achievement: 06/26/21 Potential to Achieve Goals: Fair Progress towards PT goals: Progressing  toward goals    Frequency    Min 5X/week      PT Plan Current plan remains appropriate    Co-evaluation              AM-PAC PT "6 Clicks" Mobility   Outcome Measure  Help needed turning from your back to your side while in a flat bed without using bedrails?: A Lot Help needed moving from lying on your back to sitting on the side of a flat bed without using bedrails?: A Lot Help needed moving to and from a bed to a chair (including a wheelchair)?: A Lot Help needed standing up from a chair using your arms (e.g., wheelchair or bedside chair)?: A Lot Help needed to walk in hospital room?: A Lot Help needed climbing 3-5 steps with a railing? : Total 6 Click Score: 11    End of Session Equipment Utilized During Treatment: Gait belt Activity Tolerance: Patient limited by fatigue Patient left: in chair;with call bell/phone within reach;with chair alarm set Nurse Communication: Mobility status PT Visit Diagnosis: Other abnormalities of gait and mobility (R26.89)     Time: 1410-1443 PT Time Calculation (min) (ACUTE ONLY): 33 min  Charges:  $Gait Training: 23-37 mins                     Delice Bison, PT  Acute Rehab Dept ([facility]) [phone] Pager [phone]  06/24/2021    [facility] 06/24/2021, 4:02 PM

## 2021-06-25 DIAGNOSIS — S72002A Fracture of unspecified part of neck of left femur, initial encounter for closed fracture: Secondary | ICD-10-CM | POA: Diagnosis not present

## 2021-06-25 NOTE — Progress Notes (Signed)
PROGRESS NOTE    Catherine Terrell  FGH:[id number] DOB: Feb 12, 1933 DOA: 05/31/2021 PCP: System, Provider Not In   Chief Complain: Fall  Brief Narrative:  Patient is a 87 year old female with history of interstitial lung disease, hypothyroidism, hyperlipidemia, cognitive impairment who presented secondary to fall.  After fall,she developed left hip pain.  Imaging showed left hip fracture.  Orthopedics consulted and underwent IM nailing.  Hospital course remarkable for acute blood loss anemia requiring 2 L units of PRBC.  PT/OT recommended skilled nursing facility on discharge but placement is difficult due to lack of insurance.  Patient unable to go back to home.  Medically stable for discharge.  TOC following  Assessment & Plan:   Principal Problem:   Closed left hip fracture (HCC) Active Problems:   Interstitial lung disease (HCC)   Hypothyroidism   MCI (mild cognitive impairment)   Left hip fracture: Secondary to fall in the bathroom.After fall,she developed left hip pain.  Imaging showed left hip fracture.  Orthopedics consulted and underwent IM nailing.  On aspirin twice daily for DVT prophylaxis.  Acute blood loss anemia: Postoperatively.  Underwent 2 units  of blood transfusion.  Currently hemoglobin stable  History of interstitial lung disease/COPD pulmonary fibrosis/chronic respiratory failure with hypoxia: Currently stable on room air.  Follows with pulm as an outpatient.  On pirfenidone  Hypothyroidism: On Synthyroid  Cognitive impairment/dementia: Confused at baseline.  On Aricept.  Monitor mental status.  Delirium precautions  Acute urinary retention: Resolved.  Foley taken out  Hyperlipidemia :on Lipitor  Mildly elevated liver enzymes: Unclear etiology.  No abdominal pain.  Ultrasound of the right upper quadrant did not show any obvious pathology of the liver parenchyma  Hyponatremia: Mild, continue to monitor  Disposition: Patient is originally from [place], lives  with her youngest son.  She came to visit her elder son.  At her son's place, she fell in the bathroom near commode and broke her hip.  She has insurance from [place] with does not work in [place].  Her son is not able to take care of her here.  That is why she had prolonged hospitalization.  Her youngest son in [place] is traveling and is currently not at home.  Plan is to go to [place] in October for when her son will be back.TOC is also looking for short-term rehab over here.         DVT prophylaxis:Aspirin Code Status: Full Family Communication: Son at bedside on 06/22/21 Status is: Inpatient  Remains inpatient appropriate because:Unsafe d/c plan  Dispo: The patient is from: Home              Anticipated d/c is to: SNF              Patient currently is medically stable for discharge   difficult to place patient Yes    Consultants: Orthopedics  Procedures:ORIF  Antimicrobials:  Anti-infectives (From admission, onward)    Start     Dose/Rate Route Frequency Ordered Stop   06/02/21 1830  ceFAZolin (ANCEF) IVPB 2g/100 mL premix        2 g 200 mL/hr over 30 Minutes Intravenous Every 6 hours 06/02/21 1454 06/03/21 0117   06/02/21 0830  ceFAZolin (ANCEF) IVPB 2g/100 mL premix        2 g 200 mL/hr over 30 Minutes Intravenous On call to O.R. 06/02/21 0744 06/02/21 1228   06/02/21 0600  ceFAZolin (ANCEF) IVPB 2g/100 mL premix  Status:  Discontinued  2 g 200 mL/hr over 30 Minutes Intravenous On call to O.R. 06/01/21 1333 06/02/21 0748       Subjective:  Patient seen and examined at the bedside this morning.  Hemodynamically stable.  She was sleeping.  Opens her eyes on calling her name.  She states she wanted to sleep.  Denies any complaints  Objective: Vitals:   06/24/21 0445 06/24/21 1321 06/24/21 2003 06/25/21 0631  BP: (!) 154/72 129/61 97/73 (!) 143/86  Pulse: 92 87 85 84  Resp: 19 16 18 18   Temp: 98 F (36.7 C) (!) 97.5 F (36.4 C) 98.6 F (37  C) 97.8 F (36.6 C)  TempSrc: Oral  Oral Oral  SpO2: 100% 99% 99% 100%  Weight:      Height:        Intake/Output Summary (Last 24 hours) at 06/25/2021 0810 Last data filed at 06/25/2021 0600 Gross per 24 hour  Intake 720 ml  Output 2400 ml  Net -1680 ml   Filed Weights   05/31/21 2309 06/02/21 0233  Weight: 46.8 kg 46.8 kg    Examination:  General exam: Comfortably sleeping on her bed   Data Reviewed: I have personally reviewed following labs and imaging studies  CBC: No results for input(s): WBC, NEUTROABS, HGB, HCT, MCV, PLT in the last 168 hours. Basic Metabolic Panel: Recent Labs  Lab 06/20/21 1324 06/21/21 0355 06/22/21 0426  NA 130* 130* 130*  K 4.5 4.3 4.4  CL 99 99 97*  CO2 25 26 25   GLUCOSE 104* 149* 115*  BUN 13 17 19   CREATININE 0.71 0.81 0.58  CALCIUM 8.5* 8.5* 8.7*   GFR: Estimated Creatinine Clearance: 32.3 mL/min (by C-G formula based on SCr of 0.58 mg/dL). Liver Function Tests: Recent Labs  Lab 06/20/21 1324 06/21/21 0355 06/22/21 0426  AST 67* 67* 80*  ALT 100* 90* 96*  ALKPHOS 147* 138* 142*  BILITOT 0.7 0.4 0.7  PROT 5.9* 5.3* 5.7*  ALBUMIN 2.4* 2.3* 2.4*   No results for input(s): LIPASE, AMYLASE in the last 168 hours. No results for input(s): AMMONIA in the last 168 hours. Coagulation Profile: No results for input(s): INR, PROTIME in the last 168 hours. Cardiac Enzymes: No results for input(s): CKTOTAL, CKMB, CKMBINDEX, TROPONINI in the last 168 hours. BNP (last 3 results) No results for input(s): PROBNP in the last 8760 hours. HbA1C: No results for input(s): HGBA1C in the last 72 hours. CBG: No results for input(s): GLUCAP in the last 168 hours. Lipid Profile: No results for input(s): CHOL, HDL, LDLCALC, TRIG, CHOLHDL, LDLDIRECT in the last 72 hours. Thyroid Function Tests: No results for input(s): TSH, T4TOTAL, FREET4, T3FREE, THYROIDAB in the last 72 hours. Anemia Panel: No results for input(s): VITAMINB12, FOLATE,  FERRITIN, TIBC, IRON, RETICCTPCT in the last 72 hours. Sepsis Labs: No results for input(s): PROCALCITON, LATICACIDVEN in the last 168 hours.  No results found for this or any previous visit (from the past 240 hour(s)).       Radiology Studies: No results found.      Scheduled Meds:  aspirin  81 mg Oral BID   atorvastatin  10 mg Oral Daily   donepezil  5 mg Oral QHS   levothyroxine  100 mcg Oral Once per day on Sun Fri Sat   levothyroxine  50 mcg Oral Once per day on Mon Tue Wed Thu   memantine  5 mg Oral Daily   Pirfenidone  801 mg Oral TID   Continuous Infusions:   LOS:  25 days    Time spent: 15 mins.More than 50% of that time was spent in counseling and/or coordination of care.      Burnadette Pop, MD [facility] P9/20/2022, 8:10 AM

## 2021-06-25 NOTE — Progress Notes (Signed)
Physical Therapy Treatment Patient Details Name: Catherine Terrell MRN: [id number] DOB: [birth year] Today's Date: 06/25/2021   History of Present Illness 87 yo female adm 05/31/21 after fall resulting in L IT hip fx. Pt s/p s/p ORIF IM Nail 06/02/21.  PMH: bil TKAs (done in [place]), dementia, interstitial lung disease.    PT Comments    The patient noted to be sliding to end of recliner. Assisted back into recliner and stood with 2 persons to transfer  back into bed. Continue PT. Patient appreciative for assisting  patient.   Recommendations for follow up therapy are one component of a multi-disciplinary discharge planning process, led by the attending physician.  Recommendations may be updated based on patient status, additional functional criteria and insurance authorization.  Follow Up Recommendations  SNF     Equipment Recommendations  Rolling walker with 5" wheels    Recommendations for Other Services       Precautions / Restrictions Precautions Precautions: Fall Precaution Comments: HR goes up, sob with increased activity Restrictions LLE Weight Bearing: Partial weight bearing LLE Partial Weight Bearing Percentage or Pounds: 50     Mobility  Bed Mobility   Bed Mobility: Sit to Supine Rolling: Mod assist   Supine to sit: Mod assist     General bed mobility comments: assist  with legs and trunk to return to supine    Transfers Overall transfer level: Needs assistance Equipment used: 2 person hand held assist Transfers: Sit to/from Stand Sit to Stand: Mod assist Stand pivot transfers: +2 physical assistance;+2 safety/equipment       General transfer comment: patient sliding out of  recliner. Assisted back into recliner to   stand and pivot with 2 persons hand hold.  Ambulation/Gait   Stairs             Wheelchair Mobility    Modified Rankin (Stroke Patients Only)       Balance Overall balance assessment: Needs assistance Sitting-balance support:  Feet supported Sitting balance-Leahy Scale: Fair Sitting balance - Comments: able to static sit EOB without support Postural control: Posterior lean Standing balance support: Bilateral upper extremity supported;During functional activity Standing balance-Leahy Scale: Poor Standing balance comment: relies on BUE support for static stand, initial posterior lean. external assist needed for dynamic                            Cognition Arousal/Alertness: Awake/alert Behavior During Therapy: WFL for tasks assessed/performed Overall Cognitive Status: Difficult to assess                                 General Comments: patient  did speak a few more english words to communicate" sweater, bed, indicated wanted HA's out. and wanted more blankets. Stated "I apperreciate you."      Exercises      General Comments        Pertinent Vitals/Pain Faces Pain Scale: Hurts a little bit Pain Location: Lt hip/thigh during standing, Pain Descriptors / Indicators: Grimacing Pain Intervention(s): Monitored during session    Home Living                      Prior Function            PT Goals (current goals can now be found in the care plan section) Acute Rehab PT Goals Patient Stated Goal: bed Time For Goal  Achievement: 07/09/21 Potential to Achieve Goals: Fair Progress towards PT goals: Progressing toward goals    Frequency    Min 5X/week      PT Plan Current plan remains appropriate    Co-evaluation PT/OT/SLP Co-Evaluation/Treatment: Yes Reason for Co-Treatment: For patient/therapist safety PT goals addressed during session: Mobility/safety with mobility OT goals addressed during session: ADL's and self-care      AM-PAC PT "6 Clicks" Mobility   Outcome Measure  Help needed turning from your back to your side while in a flat bed without using bedrails?: A Lot Help needed moving from lying on your back to sitting on the side of a flat bed  without using bedrails?: A Lot Help needed moving to and from a bed to a chair (including a wheelchair)?: A Lot Help needed standing up from a chair using your arms (e.g., wheelchair or bedside chair)?: A Lot Help needed to walk in hospital room?: A Lot Help needed climbing 3-5 steps with a railing? : Total 6 Click Score: 11    End of Session Equipment Utilized During Treatment: Gait belt Activity Tolerance: Patient limited by fatigue Patient left: in bed;with call bell/phone within reach;with bed alarm set Nurse Communication: Mobility status PT Visit Diagnosis: Other abnormalities of gait and mobility (R26.89)     Time: 8299-3716 PT Time Calculation (min) (ACUTE ONLY): 26 min  Charges:   $Therapeutic Activity: 23-37 mins                     Blanchard Kelch PT Acute Rehabilitation Services Pager [phone] Office [phone]    Rada Hay 06/25/2021, 10:52 AM

## 2021-06-25 NOTE — Progress Notes (Signed)
Physical Therapy Treatment Patient Details Name: Catherine Terrell MRN: [id number] DOB: Jun 20, 1933 Today's Date: 06/25/2021   History of Present Illness 87 yo female adm 05/31/21 after fall resulting in L IT hip fx. Pt s/p s/p ORIF IM Nail 06/02/21.  PMH: bil TKAs (done in [place]), dementia, interstitial lung disease.    PT Comments    Patient sleeping, aroused  patient. Patient  encouraged to mobilize through gestures. Patient  required mod assistance  to stand at [facility]. Patient  appears "weak". Ambulated x 20' then 25' with Rw, decreased control LLE,tending to internally rotate. Patient with noted 'Huiffing " breaths. HR 132, SPo2 88%,, then to 94%.  Patient left in  recliner.   Recommendations for follow up therapy are one component of a multi-disciplinary discharge planning process, led by the attending physician.  Recommendations may be updated based on patient status, additional functional criteria and insurance authorization.  Follow Up Recommendations  SNF (plans return to [place])     Equipment Recommendations  Rolling walker with 5" wheels    Recommendations for Other Services       Precautions / Restrictions Precautions Precautions: Fall Precaution Comments: HR goes up, sob with increased activity Restrictions LLE Weight Bearing: Partial weight bearing LLE Partial Weight Bearing Percentage or Pounds: 50     Mobility  Bed Mobility   Bed Mobility: Supine to Sit Rolling: Mod assist         General bed mobility comments: visual cues, trunk guided to upright with pt holding PT's hand,    Transfers Overall transfer level: Needs assistance Equipment used: Rolling walker (2 wheeled) Transfers: Sit to/from Stand Sit to Stand: Mod assist         General transfer comment: mod assist from bed and toilet, patient "weak" appearing. Rises and supports on RW, head down.  Ambulation/Gait Ambulation/Gait assistance: Mod assist;+2 safety/equipment Gait Distance (Feet): 20 Feet  (25') Assistive device: Rolling walker (2 wheeled) Gait Pattern/deviations: Step-to pattern;Trunk flexed;Narrow base of support;Staggering left;Staggering right Gait velocity: decr   General Gait Details: noted  Internal rotation of the left leg, scissors inward. Patient requires support for balance for every step. patient holding chest after ambulating. Asking to go back to bed.   Stairs             Wheelchair Mobility    Modified Rankin (Stroke Patients Only)       Balance Overall balance assessment: Needs assistance Sitting-balance support: Feet supported Sitting balance-Leahy Scale: Fair Sitting balance - Comments: able to static sit EOB without support Postural control: Posterior lean Standing balance support: Bilateral upper extremity supported;During functional activity Standing balance-Leahy Scale: Poor Standing balance comment: relies on BUE support for static stand, initial posterior lean. external assist needed for dynamic                            Cognition Arousal/Alertness: Awake/alert (once aroused.) Behavior During Therapy: WFL for tasks assessed/performed Overall Cognitive Status: Difficult to assess                                 General Comments: AxO x 2 able to follow simple commands and hand gestures.  pt speaks and comprehends  some English; patient holding chest after ambulating, noted to be SOB SPO2 88%, HR 132      Exercises      General Comments  Pertinent Vitals/Pain Faces Pain Scale: Hurts a little bit Pain Location: Lt hip/thigh during standing, Pain Descriptors / Indicators: Grimacing Pain Intervention(s): Monitored during session    Home Living                      Prior Function            PT Goals (current goals can now be found in the care plan section) Acute Rehab PT Goals Patient Stated Goal: bed Time For Goal Achievement: 07/09/21 Potential to Achieve Goals: Fair Progress  towards PT goals: Progressing toward goals    Frequency    Min 5X/week      PT Plan Current plan remains appropriate    Co-evaluation PT/OT/SLP Co-Evaluation/Treatment: Yes Reason for Co-Treatment: For patient/therapist safety PT goals addressed during session: Mobility/safety with mobility OT goals addressed during session: ADL's and self-care      AM-PAC PT "6 Clicks" Mobility   Outcome Measure  Help needed turning from your back to your side while in a flat bed without using bedrails?: A Lot Help needed moving from lying on your back to sitting on the side of a flat bed without using bedrails?: A Lot Help needed moving to and from a bed to a chair (including a wheelchair)?: A Lot Help needed standing up from a chair using your arms (e.g., wheelchair or bedside chair)?: A Lot Help needed to walk in hospital room?: A Lot Help needed climbing 3-5 steps with a railing? : Total 6 Click Score: 11    End of Session Equipment Utilized During Treatment: Gait belt Activity Tolerance: Patient limited by fatigue Patient left: in chair;with call bell/phone within reach;with chair alarm set Nurse Communication: Mobility status PT Visit Diagnosis: Other abnormalities of gait and mobility (R26.89)     Time: 6010-9323 PT Time Calculation (min) (ACUTE ONLY): 25 min  Charges:  $Gait Training: 8-22 mins                     Blanchard Kelch PT Acute Rehabilitation Services Pager [phone] Office [phone]    Rada Hay 06/25/2021, 10:31 AM

## 2021-06-25 NOTE — Progress Notes (Signed)
Patient's son with a request For Jasmine December from PT(physical therapy) to please call and give update concerning Patient.  Ameen Harrigan(son) [phone]

## 2021-06-25 NOTE — Progress Notes (Signed)
Occupational Therapy Progress Note  Upon arrival patient semi-supine in bed, needing encouragement to get out of bed. Does not initiate bed mobility, needing max visual cues, mod A to bring legs toward edge of bed and to upright trunk. With min x2 to power up to standing patient able to ambulate with unsteady gait to bathroom at times narrow base of support. Total A in standing for peri care due to reliant on B UE support on walker. Patient min x2 to ambulate from bathroom to recliner, pointing to bed needing encouragement to increase activity tolerance OOB.     06/25/21 1200  OT Visit Information  Last OT Received On 06/25/21  Assistance Needed +2 (safety with ambulation)  PT/OT/SLP Co-Evaluation/Treatment Yes  Reason for Co-Treatment To address functional/ADL transfers;For patient/therapist safety  PT goals addressed during session Mobility/safety with mobility  OT goals addressed during session ADL's and self-care  History of Present Illness 87 yo female adm 05/31/21 after fall resulting in L IT hip fx. Pt s/p s/p ORIF IM Nail 06/02/21.  PMH: bil TKAs (done in [place]), dementia, interstitial lung disease.  Precautions  Precautions Fall  Precaution Comments HR goes up, sob with increased activity  Pain Assessment  Pain Assessment Faces  Faces Pain Scale 2  Pain Location Lt hip/thigh during standing,  Pain Descriptors / Indicators Grimacing  Pain Intervention(s) Monitored during session  Cognition  Arousal/Alertness Awake/alert  Behavior During Therapy Flat affect  Overall Cognitive Status Difficult to assess  Difficult to assess due to Non-English speaking  ADL  Overall ADL's  Needs assistance/impaired  Toilet Transfer Minimal assistance;+2 for physical assistance;+2 for safety/equipment;Cueing for safety;Cueing for sequencing;RW;Ambulation;Regular Toilet  Toilet Transfer Details (indicate cue type and reason) unsteady gait with narrow base of support at times needing min x2 to  stabilize and for safety with walking ambulating to/from bathroom. also needing assist to power up to standing from toilet.  Toileting- Clothing Manipulation and Hygiene Total assistance;Sit to/from stand  Toileting - Clothing Manipulation Details (indicate cue type and reason) for peri care after voiding, reliant on UE support in standing  Functional mobility during ADLs Minimal assistance;+2 for physical assistance;+2 for safety/equipment;Cueing for safety;Cueing for sequencing;Rolling walker  General ADL Comments patient needing encouragement to ambulate to/from bathroom in order to build necessary functional activity tolerance  Bed Mobility  Overal bed mobility Needs Assistance  Bed Mobility Supine to Sit  Supine to sit Mod assist;HOB elevated  General bed mobility comments does not initiate needing max cues and mod A for trunk/ LE management  Balance  Overall balance assessment Needs assistance  Sitting-balance support Feet supported  Sitting balance-Leahy Scale Poor  Sitting balance - Comments static sitting EOB leaning into arms and wanting to lay down needing encouragement  Postural control Posterior lean  Standing balance support Bilateral upper extremity supported;During functional activity  Standing balance-Leahy Scale Poor  Standing balance comment reliant on B UE support and external assist  Restrictions  LLE Weight Bearing PWB  LLE Partial Weight Bearing Percentage or Pounds 50  Transfers  Overall transfer level Needs assistance  Equipment used Rolling walker (2 wheeled)  Transfers Sit to/from Stand  Sit to Stand Min assist;+2 physical assistance;+2 safety/equipment  General transfer comment please see toilet transfer in ADL section  General Comments  General comments (skin integrity, edema, etc.) initial O2 reading once transferred to the chair reading 88% and HR 130. within ~20 seconds seated rest O2 increase to 93% and HR 115-120  OT - End of Session  Equipment  Utilized During Treatment Gait belt;Rolling walker  Activity Tolerance Patient tolerated treatment well  Patient left in chair;with call bell/phone within reach;with chair alarm set  Nurse Communication Mobility status  OT Assessment/Plan  OT Plan Discharge plan remains appropriate  OT Visit Diagnosis History of falling (Z91.81);Other abnormalities of gait and mobility (R26.89);Unsteadiness on feet (R26.81)  OT Frequency (ACUTE ONLY) Min 2X/week  Follow Up Recommendations Other (comment) (SNF vs HH; no [employer] insurance)  OT Equipment 3 in 1 bedside commode  AM-PAC OT "6 Clicks" Daily Activity Outcome Measure (Version 2)  Help from another person eating meals? 3  Help from another person taking care of personal grooming? 3  Help from another person toileting, which includes using toliet, bedpan, or urinal? 2  Help from another person bathing (including washing, rinsing, drying)? 2  Help from another person to put on and taking off regular upper body clothing? 3  Help from another person to put on and taking off regular lower body clothing? 2  6 Click Score 15  Progressive Mobility  What is the highest level of mobility based on the progressive mobility assessment? Level 4 (Walks with assist in room) - Balance while marching in place and cannot step forward and back - Complete  Mobility Ambulated with assistance in room;Out of bed to chair with meals  OT Goal Progression  Progress towards OT goals Progressing toward goals  Acute Rehab OT Goals  Patient Stated Goal bed  OT Goal Formulation With patient  Time For Goal Achievement 07/09/21  Potential to Achieve Goals Fair  ADL Goals  Pt Will Transfer to Toilet with min guard assist;stand pivot transfer;bedside commode (walker)  Additional ADL Goal #1 Patient will tolerate 5 minutes static standing with min A in order to participate in self care tasks.  Additional ADL Goal #2 Patient will perform lower body ADL tasks with min A in order to  reduce caregiver burden.  OT Time Calculation  OT Start Time (ACUTE ONLY) 0750  OT Stop Time (ACUTE ONLY) 0815  OT Time Calculation (min) 25 min  OT General Charges  $OT Visit 1 Visit  OT Treatments  $Self Care/Home Management  8-22 mins   Marlyce Huge OT OT pager: [phone]

## 2021-06-26 DIAGNOSIS — S72002A Fracture of unspecified part of neck of left femur, initial encounter for closed fracture: Secondary | ICD-10-CM | POA: Diagnosis not present

## 2021-06-26 NOTE — Progress Notes (Signed)
Attempted to take patient to [facility] to ensure progression in mobility. Upon arrival to the room, patient was seen in recliner with her bottom almost on the leg section of the chair, very slouched. In attempt to release leg portion of chair, RN and NT used walker to cue her to sit up. She was 1+ with bringing herself to a sitting position after multiple attempts of her trying to bring herself up and falling back into the recliner. Once up, she was able to push up on arm rests to scoot herself back into the recliner independently. She was not able to bring her lower legs up to touch the recliner to get ready to stand. RN had to adjust and position her feet properly for safe standing position. Once cued to stand, she attempted to put weight onto the walker. She was 1-2 assist to stand. She was not able stand by herself at all. She was not able to fully pivot and have her back legs touch the bed before lowering her body onto the bed. She began appearing to fall into the bed as we were pivoting her. Once transfer was finished patient was laying on her side with legs touching the floor and her upper half on the middle/lower end of the bed. She was not able to lift her legs onto the bed. RN lifted legs for her and put them on the bed. Patient was cleaned, peri care performed, and new linen was provided. When cueing patient to roll, she was unable to help to provide any assistance. RN and NT had to hold patient in side position while performing care. If and when letting go of the patient to change sides and/or to assess if patient could hold herself in that position, she was not able to and fell back into a supine position. Patient was left in sitting position in bed upon leaving the room.

## 2021-06-26 NOTE — Progress Notes (Signed)
PROGRESS NOTE    [name]  FGH:[id number] DOB: Feb 12, 1933 DOA: 05/31/2021 PCP: System, Provider Not In   Chief Complain: Fall  Brief Narrative:  Patient is a 87 year old female with history of interstitial lung disease, hypothyroidism, hyperlipidemia, cognitive impairment who presented secondary to fall.  After fall,she developed left hip pain.  Imaging showed left hip fracture.  Orthopedics consulted and underwent IM nailing.  Hospital course remarkable for acute blood loss anemia requiring 2 L units of PRBC.  PT/OT recommended skilled nursing facility on discharge but placement is difficult due to lack of insurance.  Patient unable to go back to home.  Medically stable for discharge.  TOC following  Assessment & Plan:   Principal Problem:   Closed left hip fracture (HCC) Active Problems:   Interstitial lung disease (HCC)   Hypothyroidism   MCI (mild cognitive impairment)   Left hip fracture: Secondary to fall in the bathroom.After fall,she developed left hip pain.  Imaging showed left hip fracture.  Orthopedics consulted and underwent IM nailing.  On aspirin twice daily for DVT prophylaxis.  Acute blood loss anemia: Postoperatively.  Underwent 2 units  of blood transfusion.  Currently hemoglobin stable  History of interstitial lung disease/COPD pulmonary fibrosis/chronic respiratory failure with hypoxia: Currently stable on room air.  Follows with pulm as an outpatient.  On pirfenidone  Hypothyroidism: On Synthyroid  Cognitive impairment/dementia: Confused at baseline.  On Aricept.  Monitor mental status.  Delirium precautions  Acute urinary retention: Resolved.  Foley taken out  Hyperlipidemia :on Lipitor  Mildly elevated liver enzymes: Unclear etiology.  No abdominal pain.  Ultrasound of the right upper quadrant did not show any obvious pathology of the liver parenchyma  Hyponatremia: Mild, continue to monitor  Disposition: Patient is originally from [place], lives  with her youngest son.  She came to visit her elder son.  At her son's place, she fell in the bathroom near commode and broke her hip.  She has insurance from [place] with does not work in [place].  Her son is not able to take care of her here.  That is why she had prolonged hospitalization.  Her youngest son in [place] is traveling and is currently not at home.  Plan is to go to [place] in October for when her son will be back.TOC is also looking for short-term rehab over here.         DVT prophylaxis:Aspirin Code Status: Full Family Communication: Son at bedside on 06/22/21 Status is: Inpatient  Remains inpatient appropriate because:Unsafe d/c plan  Dispo: The patient is from: Home              Anticipated d/c is to: SNF              Patient currently is medically stable for discharge   difficult to place patient Yes    Consultants: Orthopedics  Procedures:ORIF  Antimicrobials:  Anti-infectives (From admission, onward)    Start     Dose/Rate Route Frequency Ordered Stop   06/02/21 1830  ceFAZolin (ANCEF) IVPB 2g/100 mL premix        2 g 200 mL/hr over 30 Minutes Intravenous Every 6 hours 06/02/21 1454 06/03/21 0117   06/02/21 0830  ceFAZolin (ANCEF) IVPB 2g/100 mL premix        2 g 200 mL/hr over 30 Minutes Intravenous On call to O.R. 06/02/21 0744 06/02/21 1228   06/02/21 0600  ceFAZolin (ANCEF) IVPB 2g/100 mL premix  Status:  Discontinued  2 g 200 mL/hr over 30 Minutes Intravenous On call to O.R. 06/01/21 1333 06/02/21 0748       Subjective:  Patient seen and examined the bedside this morning.  Hemodynamically stable.  She looked comfortable, lying in bed.  She was awake and alert today.  Denies any complaints  Objective: Vitals:   06/25/21 1347 06/25/21 2030 06/26/21 0121 06/26/21 0620  BP: 125/75  137/74 (!) 154/73  Pulse: 85  85 75  Resp: 16  18 16   Temp: 97.6 F (36.4 C)  98.4 F (36.9 C) 97.6 F (36.4 C)  TempSrc: Oral   Oral   SpO2: 99% 98% 99% 100%  Weight:      Height:        Intake/Output Summary (Last 24 hours) at 06/26/2021 0751 Last data filed at 06/26/2021 0600 Gross per 24 hour  Intake 740 ml  Output 1000 ml  Net -260 ml   Filed Weights   05/31/21 2309 06/02/21 0233  Weight: 46.8 kg 46.8 kg    Examination:  General exam: Comfortable, lying on bed, not in any kind of distress   Data Reviewed: I have personally reviewed following labs and imaging studies  CBC: No results for input(s): WBC, NEUTROABS, HGB, HCT, MCV, PLT in the last 168 hours. Basic Metabolic Panel: Recent Labs  Lab 06/20/21 1324 06/21/21 0355 06/22/21 0426  NA 130* 130* 130*  K 4.5 4.3 4.4  CL 99 99 97*  CO2 25 26 25   GLUCOSE 104* 149* 115*  BUN 13 17 19   CREATININE 0.71 0.81 0.58  CALCIUM 8.5* 8.5* 8.7*   GFR: Estimated Creatinine Clearance: 32.3 mL/min (by C-G formula based on SCr of 0.58 mg/dL). Liver Function Tests: Recent Labs  Lab 06/20/21 1324 06/21/21 0355 06/22/21 0426  AST 67* 67* 80*  ALT 100* 90* 96*  ALKPHOS 147* 138* 142*  BILITOT 0.7 0.4 0.7  PROT 5.9* 5.3* 5.7*  ALBUMIN 2.4* 2.3* 2.4*   No results for input(s): LIPASE, AMYLASE in the last 168 hours. No results for input(s): AMMONIA in the last 168 hours. Coagulation Profile: No results for input(s): INR, PROTIME in the last 168 hours. Cardiac Enzymes: No results for input(s): CKTOTAL, CKMB, CKMBINDEX, TROPONINI in the last 168 hours. BNP (last 3 results) No results for input(s): PROBNP in the last 8760 hours. HbA1C: No results for input(s): HGBA1C in the last 72 hours. CBG: No results for input(s): GLUCAP in the last 168 hours. Lipid Profile: No results for input(s): CHOL, HDL, LDLCALC, TRIG, CHOLHDL, LDLDIRECT in the last 72 hours. Thyroid Function Tests: No results for input(s): TSH, T4TOTAL, FREET4, T3FREE, THYROIDAB in the last 72 hours. Anemia Panel: No results for input(s): VITAMINB12, FOLATE, FERRITIN, TIBC, IRON,  RETICCTPCT in the last 72 hours. Sepsis Labs: No results for input(s): PROCALCITON, LATICACIDVEN in the last 168 hours.  No results found for this or any previous visit (from the past 240 hour(s)).       Radiology Studies: No results found.      Scheduled Meds:  aspirin  81 mg Oral BID   atorvastatin  10 mg Oral Daily   donepezil  5 mg Oral QHS   levothyroxine  100 mcg Oral Once per day on Sun Fri Sat   levothyroxine  50 mcg Oral Once per day on Mon Tue Wed Thu   memantine  5 mg Oral Daily   Pirfenidone  801 mg Oral TID   Continuous Infusions:   LOS: 26 days    Time  spent: 15 mins.More than 50% of that time was spent in counseling and/or coordination of care.      Burnadette Pop, MD [facility] P9/21/2022, 7:51 AM

## 2021-06-26 NOTE — Plan of Care (Signed)
  Problem: Activity: Goal: Ability to ambulate and perform ADLs will improve Outcome: Not Progressing   Problem: Self-Concept: Goal: Ability to maintain and perform role responsibilities to the fullest extent possible will improve Outcome: Not Progressing   Problem: Activity: Goal: Risk for activity intolerance will decrease Outcome: Not Progressing

## 2021-06-26 NOTE — Progress Notes (Signed)
Physical Therapy Treatment Patient Details Name: Catherine Terrell MRN: [id number] DOB: 11/10/1932 Today's Date: 06/26/2021   History of Present Illness 87 yo female adm 05/31/21 after fall resulting in L IT hip fx. Pt s/p s/p ORIF IM Nail 06/02/21.  PMH: bil TKAs (done in [place]), dementia, interstitial lung disease.    PT Comments    Pt in bed, bed flat, asleep on arrival.  Pt awakens with verbal stimuli; follows commands and participates with PT with multi-modal cues  and encouragement. Worked on bed mobility and transfers, pt able to mobilize with +1 assist, min assist overall. No incr WOB with transfers.   Pt needs to mobilize with nursing staff in addition to PT, to improve endurance for planned trip (flight) home to [place] on Oct 4th.    Recommendations for follow up therapy are one component of a multi-disciplinary discharge planning process, led by the attending physician.  Recommendations may be updated based on patient status, additional functional criteria and insurance authorization.  Follow Up Recommendations  SNF;Other (comment) (difficult placement)     Equipment Recommendations  Rolling walker with 5" wheels    Recommendations for Other Services       Precautions / Restrictions Precautions Precautions: Fall Restrictions Weight Bearing Restrictions: Yes LLE Weight Bearing: Partial weight bearing LLE Partial Weight Bearing Percentage or Pounds: 50%     Mobility  Bed Mobility Overal bed mobility: Needs Assistance Bed Mobility: Supine to Sit     Supine to sit: Min assist     General bed mobility comments: pt requires tactile & visual cues to move LEs off bed, pull sPTs hand to guide trunk to sitting with overall min assist; HOB ~ 30*    Transfers Overall transfer level: Needs assistance Equipment used: Rolling walker (2 wheeled) Transfers: Sit to/from [employer] Sit to Stand: Min assist;Min guard Stand pivot transfers: Min assist        General transfer comment: visual cues to stand and pivot, steadying assist to rise and transition to RW, visual cues to pivot, light assist to balance and maneuver RW  Ambulation/Gait                 Stairs             Wheelchair Mobility    Modified Rankin (Stroke Patients Only)       Balance     Sitting balance-Leahy Scale: Fair Sitting balance - Comments: posterior lean initially, when not assisted pt was able to maintain midline   Standing balance support: Bilateral upper extremity supported;During functional activity Standing balance-Leahy Scale: Poor Standing balance comment: reliant on B UE support and external assist--min/guard                            Cognition Arousal/Alertness: Awake/alert Behavior During Therapy: WFL for tasks assessed/performed Overall Cognitive Status: Difficult to assess                                 General Comments: speaking less English today however follows visual cues without difficulty      Exercises General Exercises - Lower Extremity Ankle Circles/Pumps: AROM;10 reps;Both    General Comments        Pertinent Vitals/Pain Pain Assessment: Faces Faces Pain Scale: Hurts a little bit Pain Location: L hip with activity (pt also point to stomach--?pain) Pain Descriptors / Indicators: Grimacing Pain Intervention(s):  Limited activity within patient's tolerance;Monitored during session;Repositioned    Home Living                      Prior Function            PT Goals (current goals can now be found in the care plan section) Acute Rehab PT Goals Patient Stated Goal: bed PT Goal Formulation: Patient unable to participate in goal setting Time For Goal Achievement: 07/09/21 Potential to Achieve Goals: Fair Progress towards PT goals: Progressing toward goals    Frequency    Min 5X/week      PT Plan Current plan remains appropriate    Co-evaluation               AM-PAC PT "6 Clicks" Mobility   Outcome Measure  Help needed turning from your back to your side while in a flat bed without using bedrails?: A Lot Help needed moving from lying on your back to sitting on the side of a flat bed without using bedrails?: A Little Help needed moving to and from a bed to a chair (including a wheelchair)?: A Little Help needed standing up from a chair using your arms (e.g., wheelchair or bedside chair)?: A Little Help needed to walk in hospital room?: A Lot Help needed climbing 3-5 steps with a railing? : A Lot 6 Click Score: 15    End of Session Equipment Utilized During Treatment: Gait belt Activity Tolerance: Patient tolerated treatment well Patient left: in chair;with call bell/phone within reach;with chair alarm set Nurse Communication: Mobility status PT Visit Diagnosis: Other abnormalities of gait and mobility (R26.89)     Time: 8329-1916 PT Time Calculation (min) (ACUTE ONLY): 24 min  Charges:  $Therapeutic Activity: 23-37 mins                     Delice Bison, PT  Acute Rehab Dept ([facility]) [phone] Pager [phone]  06/26/2021    [facility] 06/26/2021, 12:54 PM

## 2021-06-27 DIAGNOSIS — S72002A Fracture of unspecified part of neck of left femur, initial encounter for closed fracture: Secondary | ICD-10-CM | POA: Diagnosis not present

## 2021-06-27 MED ORDER — LIP MEDEX EX OINT
1.0000 "application " | TOPICAL_OINTMENT | CUTANEOUS | Status: DC | PRN
  Administered 2021-06-27: 1 via TOPICAL
  Filled 2021-06-27 (×2): qty 7

## 2021-06-27 NOTE — Progress Notes (Signed)
Physical Therapy Treatment Patient Details Name: Catherine Terrell MRN: [id number] DOB: Mar 02, 1933 Today's Date: 06/27/2021   History of Present Illness 87 yo female adm 05/31/21 after fall resulting in L IT hip fx. Pt s/p s/p ORIF IM Nail 06/02/21.  PMH: bil TKAs (done in [place]), dementia, interstitial lung disease.    PT Comments    Patient making some progress this date and ambulated increased distance of ~35' with Mod assist to manage walker safely and position Lt hip. Pt continues to have tendency to advance walker too far ahead of BOS and advances Lt LE in internally rotated position. This session focus on lateral pivot/scoot transfer training to initiate training for airplane mobility. Pt completed 3x in recliner with min assist from therapist. Will continue to progress mobility as able with pt and progress family training with pt's son present.     Recommendations for follow up therapy are one component of a multi-disciplinary discharge planning process, led by the attending physician.  Recommendations may be updated based on patient status, additional functional criteria and insurance authorization.  Follow Up Recommendations  Other (comment) (pt plans on dc'ing to airplane to fly back to [place] with son on Oct 4th)     Equipment Recommendations  Rolling walker with 5" wheels    Recommendations for Other Services       Precautions / Restrictions Precautions Precautions: Fall Precaution Comments: HR goes up, sob with increased activity Restrictions Weight Bearing Restrictions: Yes LLE Weight Bearing: Partial weight bearing LLE Partial Weight Bearing Percentage or Pounds: 50%     Mobility  Bed Mobility Overal bed mobility: Needs Assistance Bed Mobility: Supine to Sit     Supine to sit: Min assist;HOB elevated     General bed mobility comments: min assist for LE's off EOB, pt requires extra time to initiate pivoting to EOB. pt holding onto therapist to pull/pivot hips and  trunk around.    Transfers Overall transfer level: Needs assistance   Transfers: Sit to/from Stand;Lateral/Scoot Transfers Sit to Stand: Min assist        Lateral/Scoot Transfers: Min assist General transfer comment: tactile/visual cues, and son translating. pt with good recall to use one or both UE's for power up from EOB and recliner to stand. Min assist to complete power up and steady once standing. Cues for lateral scoot/pivot on recliner with arms dropped to simulate airplane seat and bringing LE's around to aisle to stand. 2x to the Rt and 1x to the Lt. Min assist for bringing LE's all the way over edge of chair and to provide and arm for pt to use to pull/scoot 90 degrees.  Ambulation/Gait Ambulation/Gait assistance: Mod assist;+2 safety/equipment Gait Distance (Feet): 35 Feet Assistive device: Rolling walker (2 wheeled) Gait Pattern/deviations: Step-to pattern;Trunk flexed;Narrow base of support;Staggering left;Staggering right Gait velocity: decr   General Gait Details: pt requires manual cues/assist to manage walker position as she has a tendency to push walker too par ahead and walks with Lt hip internally rotated and abducted. Mod assist required to steady balance and to manage walker. Pt's son pushed chair for follow.   Stairs             Wheelchair Mobility    Modified Rankin (Stroke Patients Only)       Balance Overall balance assessment: Needs assistance Sitting-balance support: Feet supported Sitting balance-Leahy Scale: Poor Sitting balance - Comments: posterior leaning   Standing balance support: Bilateral upper extremity supported Standing balance-Leahy Scale: Poor Standing balance comment: reliant  on external assistance                            Cognition Arousal/Alertness: Awake/alert Behavior During Therapy: WFL for tasks assessed/performed Overall Cognitive Status: Within Functional Limits for tasks assessed                                  General Comments: pt good with gestures, son translating this session      Exercises      General Comments        Pertinent Vitals/Pain Pain Assessment: Faces Faces Pain Scale: Hurts little more Pain Location: L hip with activity Pain Descriptors / Indicators: Grimacing Pain Intervention(s): Limited activity within patient's tolerance;Monitored during session;Repositioned    Home Living                      Prior Function            PT Goals (current goals can now be found in the care plan section) Acute Rehab PT Goals Patient Stated Goal: warm milk PT Goal Formulation: Patient unable to participate in goal setting Time For Goal Achievement: 07/09/21 Potential to Achieve Goals: Fair Progress towards PT goals: Progressing toward goals    Frequency    Min 5X/week      PT Plan Current plan remains appropriate    Co-evaluation              AM-PAC PT "6 Clicks" Mobility   Outcome Measure  Help needed turning from your back to your side while in a flat bed without using bedrails?: A Little Help needed moving from lying on your back to sitting on the side of a flat bed without using bedrails?: A Little Help needed moving to and from a bed to a chair (including a wheelchair)?: A Little Help needed standing up from a chair using your arms (e.g., wheelchair or bedside chair)?: A Little Help needed to walk in hospital room?: A Lot Help needed climbing 3-5 steps with a railing? : Total 6 Click Score: 15    End of Session Equipment Utilized During Treatment: Gait belt Activity Tolerance: Patient tolerated treatment well Patient left: in chair;with call bell/phone within reach;with chair alarm set;with family/visitor present Nurse Communication: Mobility status PT Visit Diagnosis: Other abnormalities of gait and mobility (R26.89)     Time: 7793-9688 PT Time Calculation (min) (ACUTE ONLY): 39 min  Charges:  $Gait Training:  8-22 mins $Therapeutic Activity: 23-37 mins                     Wynn Maudlin, DPT Acute Rehabilitation Services Office [phone] Pager [phone]    Catherine Terrell 06/27/2021, 7:31 PM

## 2021-06-27 NOTE — Progress Notes (Signed)
Occupational Therapy Treatment Patient Details Name: Catherine Terrell MRN: [id number] DOB: [birth year] Today's Date: 06/27/2021   History of present illness 87 yo female adm 05/31/21 after fall resulting in L IT hip fx. Pt s/p s/p ORIF IM Nail 06/02/21.  PMH: bil TKAs (done in [place]), dementia, interstitial lung disease.   OT comments  Unfortunately session continues to be limited due to patient unable to hear/understand phone interpreter despite having hearing aides in. [employer] on use of gestures for mobility. Patient needing mod A to upright trunk with posterior lean at edge of bed. Min A to power up to standing and pivot to recliner chair. Notify CNA to check on patient as she has tendency to slide forward in chair.    Recommendations for follow up therapy are one component of a multi-disciplinary discharge planning process, led by the attending physician.  Recommendations may be updated based on patient status, additional functional criteria and insurance authorization.    Follow Up Recommendations  Other (comment) (SNF vs HH; no [employer] insurance)    Equipment Recommendations  3 in 1 bedside commode       Precautions / Restrictions Precautions Precautions: Fall Precaution Comments: HR goes up, sob with increased activity Restrictions Weight Bearing Restrictions: Yes LLE Weight Bearing: Partial weight bearing LLE Partial Weight Bearing Percentage or Pounds: 50%       Mobility Bed Mobility Overal bed mobility: Needs Assistance Bed Mobility: Supine to Sit     Supine to sit: Mod assist     General bed mobility comments: use of gestures to try and inititate, mod A for LEs and for trunk support to sit upright with posterior bias    Transfers Overall transfer level: Needs assistance   Transfers: Stand Pivot Transfers   Stand pivot transfers: Min assist       General transfer comment: use of gestures for hand placement and min A to power up to standing then pivot to recliner  chair, unable to hear/understand intrepreter despite hearing aides    Balance Overall balance assessment: Needs assistance Sitting-balance support: Feet supported Sitting balance-Leahy Scale: Poor Sitting balance - Comments: posterior leaning   Standing balance support: Bilateral upper extremity supported Standing balance-Leahy Scale: Poor Standing balance comment: reliant on external assistance                           ADL either performed or assessed with clinical judgement   ADL Overall ADL's : Needs assistance/impaired Eating/Feeding: Set up;Sitting                       Toilet Transfer: Minimal assistance;Stand-pivot Toilet Transfer Details (indicate cue type and reason): patient needing min A to power up to standing from edge of bed, cue patient to reach for chair arm to pivot into chair.         Functional mobility during ADLs: Minimal assistance        Cognition Arousal/Alertness: Awake/alert Behavior During Therapy: WFL for tasks assessed/performed Overall Cognitive Status: Difficult to assess                                 General Comments: does not speak any English this session, utilize gestures as she cannot hear intrepreter despite hearing aides                   Pertinent Vitals/ Pain  Pain Assessment: Faces Faces Pain Scale: Hurts a little bit Pain Location: L hip with activity Pain Descriptors / Indicators: Grimacing Pain Intervention(s): Monitored during session         Frequency  Min 2X/week        Progress Toward Goals  OT Goals(current goals can now be found in the care plan section)  Progress towards OT goals: Progressing toward goals  Acute Rehab OT Goals Patient Stated Goal: warm milk OT Goal Formulation: With patient Time For Goal Achievement: 07/09/21 Potential to Achieve Goals: Fair ADL Goals Pt Will Transfer to Toilet: with min guard assist;stand pivot transfer;bedside commode  (walker) Additional ADL Goal #1: Patient will tolerate 5 minutes static standing with min A in order to participate in self care tasks. Additional ADL Goal #2: Patient will perform lower body ADL tasks with min A in order to reduce caregiver burden.  Plan Discharge plan remains appropriate       AM-PAC OT "6 Clicks" Daily Activity     Outcome Measure   Help from another person eating meals?: A Little Help from another person taking care of personal grooming?: A Little Help from another person toileting, which includes using toliet, bedpan, or urinal?: A Lot Help from another person bathing (including washing, rinsing, drying)?: A Lot Help from another person to put on and taking off regular upper body clothing?: A Little Help from another person to put on and taking off regular lower body clothing?: A Lot 6 Click Score: 15    End of Session Equipment Utilized During Treatment: Gait belt  OT Visit Diagnosis: History of falling (Z91.81);Other abnormalities of gait and mobility (R26.89);Unsteadiness on feet (R26.81)   Activity Tolerance Patient tolerated treatment well   Patient Left in chair;with call bell/phone within reach;with chair alarm set   Nurse Communication Mobility status        Time: 1287-8676 OT Time Calculation (min): 23 min  Charges: OT General Charges $OT Visit: 1 Visit OT Treatments $Self Care/Home Management : 23-37 mins  Marlyce Huge OT OT pager: [phone]  Carmelia Roller 06/27/2021, 12:29 PM

## 2021-06-27 NOTE — Progress Notes (Signed)
PROGRESS NOTE    Catherine Terrell  FGH:[id number] DOB: Feb 12, 1933 DOA: 05/31/2021 PCP: System, Provider Not In   Chief Complain: Fall  Brief Narrative:  Patient is a 87 year old female with history of interstitial lung disease, hypothyroidism, hyperlipidemia, cognitive impairment who presented secondary to fall.  After fall,she developed left hip pain.  Imaging showed left hip fracture.  Orthopedics consulted and underwent IM nailing.  Hospital course remarkable for acute blood loss anemia requiring 2 L units of PRBC.  PT/OT recommended skilled nursing facility on discharge but placement is difficult due to lack of insurance.  Patient unable to go back to home.  Medically stable for discharge.  TOC following  Assessment & Plan:   Principal Problem:   Closed left hip fracture (HCC) Active Problems:   Interstitial lung disease (HCC)   Hypothyroidism   MCI (mild cognitive impairment)   Left hip fracture: Secondary to fall in the bathroom.After fall,she developed left hip pain.  Imaging showed left hip fracture.  Orthopedics consulted and underwent IM nailing.  On aspirin twice daily for DVT prophylaxis.  Acute blood loss anemia: Postoperatively.  Underwent 2 units  of blood transfusion.  Currently hemoglobin stable  History of interstitial lung disease/COPD pulmonary fibrosis/chronic respiratory failure with hypoxia: Currently stable on room air.  Follows with pulm as an outpatient.  On pirfenidone  Hypothyroidism: On Synthyroid  Cognitive impairment/dementia: Confused at baseline.  On Aricept.  Monitor mental status.  Delirium precautions  Acute urinary retention: Resolved.  Foley taken out  Hyperlipidemia :on Lipitor  Mildly elevated liver enzymes: Unclear etiology.  No abdominal pain.  Ultrasound of the right upper quadrant did not show any obvious pathology of the liver parenchyma  Hyponatremia: Mild, continue to monitor  Disposition: Patient is originally from [place], lives  with her youngest son.  She came to visit her elder son.  At her son's place, she fell in the bathroom near commode and broke her hip.  She has insurance from [place] with does not work in [place].  Her son is not able to take care of her here.  That is why she had prolonged hospitalization.  Her youngest son in [place] is traveling and is currently not at home.  Plan is to go to [place] in October for when her son will be back.TOC is also looking for short-term rehab over here.         DVT prophylaxis:Aspirin Code Status: Full Family Communication: Son at bedside on 06/22/21 Status is: Inpatient  Remains inpatient appropriate because:Unsafe d/c plan  Dispo: The patient is from: Home              Anticipated d/c is to: SNF              Patient currently is medically stable for discharge   difficult to place patient Yes    Consultants: Orthopedics  Procedures:ORIF  Antimicrobials:  Anti-infectives (From admission, onward)    Start     Dose/Rate Route Frequency Ordered Stop   06/02/21 1830  ceFAZolin (ANCEF) IVPB 2g/100 mL premix        2 g 200 mL/hr over 30 Minutes Intravenous Every 6 hours 06/02/21 1454 06/03/21 0117   06/02/21 0830  ceFAZolin (ANCEF) IVPB 2g/100 mL premix        2 g 200 mL/hr over 30 Minutes Intravenous On call to O.R. 06/02/21 0744 06/02/21 1228   06/02/21 0600  ceFAZolin (ANCEF) IVPB 2g/100 mL premix  Status:  Discontinued  2 g 200 mL/hr over 30 Minutes Intravenous On call to O.R. 06/01/21 1333 06/02/21 0748       Subjective:  Patient seen and examined at the bedside this morning in her room.  She was sitting in the chair.  Looks very comfortable.  She  was requesting to move her into the bed and asked me to keep the door open  Objective: Vitals:   06/26/21 0620 06/26/21 1328 06/26/21 2111 06/27/21 0511  BP: (!) 154/73 135/73 (!) 165/85 (!) 149/87  Pulse: 75 83 83 79  Resp: 16 16 18 18   Temp: 97.6 F (36.4 C) 98 F (36.7  C) 98.8 F (37.1 C) (!) 97.5 F (36.4 C)  TempSrc: Oral Oral Oral Oral  SpO2: 100% 100% 99% 100%  Weight:      Height:        Intake/Output Summary (Last 24 hours) at 06/27/2021 0819 Last data filed at 06/27/2021 0600 Gross per 24 hour  Intake 840 ml  Output 2300 ml  Net -1460 ml   Filed Weights   05/31/21 2309 06/02/21 0233  Weight: 46.8 kg 46.8 kg    Examination:  General exam: Comfortable, not in any kind of distress, sitting in the chair   Data Reviewed: I have personally reviewed following labs and imaging studies  CBC: No results for input(s): WBC, NEUTROABS, HGB, HCT, MCV, PLT in the last 168 hours. Basic Metabolic Panel: Recent Labs  Lab 06/20/21 1324 06/21/21 0355 06/22/21 0426  NA 130* 130* 130*  K 4.5 4.3 4.4  CL 99 99 97*  CO2 25 26 25   GLUCOSE 104* 149* 115*  BUN 13 17 19   CREATININE 0.71 0.81 0.58  CALCIUM 8.5* 8.5* 8.7*   GFR: Estimated Creatinine Clearance: 32.3 mL/min (by C-G formula based on SCr of 0.58 mg/dL). Liver Function Tests: Recent Labs  Lab 06/20/21 1324 06/21/21 0355 06/22/21 0426  AST 67* 67* 80*  ALT 100* 90* 96*  ALKPHOS 147* 138* 142*  BILITOT 0.7 0.4 0.7  PROT 5.9* 5.3* 5.7*  ALBUMIN 2.4* 2.3* 2.4*   No results for input(s): LIPASE, AMYLASE in the last 168 hours. No results for input(s): AMMONIA in the last 168 hours. Coagulation Profile: No results for input(s): INR, PROTIME in the last 168 hours. Cardiac Enzymes: No results for input(s): CKTOTAL, CKMB, CKMBINDEX, TROPONINI in the last 168 hours. BNP (last 3 results) No results for input(s): PROBNP in the last 8760 hours. HbA1C: No results for input(s): HGBA1C in the last 72 hours. CBG: No results for input(s): GLUCAP in the last 168 hours. Lipid Profile: No results for input(s): CHOL, HDL, LDLCALC, TRIG, CHOLHDL, LDLDIRECT in the last 72 hours. Thyroid Function Tests: No results for input(s): TSH, T4TOTAL, FREET4, T3FREE, THYROIDAB in the last 72  hours. Anemia Panel: No results for input(s): VITAMINB12, FOLATE, FERRITIN, TIBC, IRON, RETICCTPCT in the last 72 hours. Sepsis Labs: No results for input(s): PROCALCITON, LATICACIDVEN in the last 168 hours.  No results found for this or any previous visit (from the past 240 hour(s)).       Radiology Studies: No results found.      Scheduled Meds:  aspirin  81 mg Oral BID   atorvastatin  10 mg Oral Daily   donepezil  5 mg Oral QHS   levothyroxine  100 mcg Oral Once per day on Sun Fri Sat   levothyroxine  50 mcg Oral Once per day on Mon Tue Wed Thu   memantine  5 mg Oral Daily  Pirfenidone  801 mg Oral TID   Continuous Infusions:   LOS: 27 days    Time spent: 15 mins.More than 50% of that time was spent in counseling and/or coordination of care.      Burnadette Pop, MD [facility] P9/22/2022, 8:19 AM

## 2021-06-28 DIAGNOSIS — S72002A Fracture of unspecified part of neck of left femur, initial encounter for closed fracture: Secondary | ICD-10-CM | POA: Diagnosis not present

## 2021-06-28 NOTE — Progress Notes (Signed)
PROGRESS NOTE    Catherine Terrell  FGH:[id number] DOB: Feb 12, 1933 DOA: 05/31/2021 PCP: System, Provider Not In   Chief Complain: Fall  Brief Narrative:  Patient is a 87 year old female with history of interstitial lung disease, hypothyroidism, hyperlipidemia, cognitive impairment who presented secondary to fall.  After fall,she developed left hip pain.  Imaging showed left hip fracture.  Orthopedics consulted and underwent IM nailing.  Hospital course remarkable for acute blood loss anemia requiring 2 L units of PRBC.  PT/OT recommended skilled nursing facility on discharge but placement is difficult due to lack of insurance.  Patient unable to go back to home.  Medically stable for discharge.  TOC following  Assessment & Plan:   Principal Problem:   Closed left hip fracture (HCC) Active Problems:   Interstitial lung disease (HCC)   Hypothyroidism   MCI (mild cognitive impairment)   Left hip fracture: Secondary to fall in the bathroom.After fall,she developed left hip pain.  Imaging showed left hip fracture.  Orthopedics consulted and underwent IM nailing.  On aspirin twice daily for DVT prophylaxis.  Acute blood loss anemia: Postoperatively.  Underwent 2 units  of blood transfusion.  Currently hemoglobin stable  History of interstitial lung disease/COPD pulmonary fibrosis/chronic respiratory failure with hypoxia: Currently stable on room air.  Follows with pulm as an outpatient.  On pirfenidone  Hypothyroidism: On Synthyroid  Cognitive impairment/dementia: Confused at baseline.  On Aricept.  Monitor mental status.  Delirium precautions  Acute urinary retention: Resolved.  Foley taken out  Hyperlipidemia :on Lipitor  Mildly elevated liver enzymes: Unclear etiology.  No abdominal pain.  Ultrasound of the right upper quadrant did not show any obvious pathology of the liver parenchyma  Hyponatremia: Mild, continue to monitor  Disposition: Patient is originally from [place], lives  with her youngest son.  She came to visit her elder son.  At her son's place, she fell in the bathroom near commode and broke her hip.  She has insurance from [place] with does not work in [place].  Her son is not able to take care of her here.  That is why she had prolonged hospitalization.  Her youngest son in [place] is traveling and is currently not at home.  Plan is to go to [place] in October for when her son will be back.TOC is also looking for short-term rehab over here.         DVT prophylaxis:Aspirin Code Status: Full Family Communication: Son at bedside on 06/22/21 Status is: Inpatient  Remains inpatient appropriate because:Unsafe d/c plan  Dispo: The patient is from: Home              Anticipated d/c is to: SNF              Patient currently is medically stable for discharge   difficult to place patient Yes    Consultants: Orthopedics  Procedures:ORIF  Antimicrobials:  Anti-infectives (From admission, onward)    Start     Dose/Rate Route Frequency Ordered Stop   06/02/21 1830  ceFAZolin (ANCEF) IVPB 2g/100 mL premix        2 g 200 mL/hr over 30 Minutes Intravenous Every 6 hours 06/02/21 1454 06/03/21 0117   06/02/21 0830  ceFAZolin (ANCEF) IVPB 2g/100 mL premix        2 g 200 mL/hr over 30 Minutes Intravenous On call to O.R. 06/02/21 0744 06/02/21 1228   06/02/21 0600  ceFAZolin (ANCEF) IVPB 2g/100 mL premix  Status:  Discontinued  2 g 200 mL/hr over 30 Minutes Intravenous On call to O.R. 06/01/21 1333 06/02/21 0748       Subjective:  Patient seen and examined at the bedside today.  She was comfortably resting on the bed.  No new complaints  Objective: Vitals:   06/26/21 2111 06/27/21 0511 06/27/21 1153 06/27/21 2151  BP: (!) 165/85 (!) 149/87 129/70 (!) 144/84  Pulse: 83 79 91 89  Resp: 18 18  15   Temp: 98.8 F (37.1 C) (!) 97.5 F (36.4 C) 98.2 F (36.8 C)   TempSrc: Oral Oral Oral   SpO2: 99% 100% 98% 100%  Weight:       Height:        Intake/Output Summary (Last 24 hours) at 06/28/2021 0813 Last data filed at 06/27/2021 1703 Gross per 24 hour  Intake --  Output 100 ml  Net -100 ml   Filed Weights   05/31/21 2309 06/02/21 0233  Weight: 46.8 kg 46.8 kg    Examination:  General exam: Comfortable, not in any kind of distress,lying on bed   Data Reviewed: I have personally reviewed following labs and imaging studies  CBC: No results for input(s): WBC, NEUTROABS, HGB, HCT, MCV, PLT in the last 168 hours. Basic Metabolic Panel: Recent Labs  Lab 06/22/21 0426  NA 130*  K 4.4  CL 97*  CO2 25  GLUCOSE 115*  BUN 19  CREATININE 0.58  CALCIUM 8.7*   GFR: Estimated Creatinine Clearance: 32.3 mL/min (by C-G formula based on SCr of 0.58 mg/dL). Liver Function Tests: Recent Labs  Lab 06/22/21 0426  AST 80*  ALT 96*  ALKPHOS 142*  BILITOT 0.7  PROT 5.7*  ALBUMIN 2.4*   No results for input(s): LIPASE, AMYLASE in the last 168 hours. No results for input(s): AMMONIA in the last 168 hours. Coagulation Profile: No results for input(s): INR, PROTIME in the last 168 hours. Cardiac Enzymes: No results for input(s): CKTOTAL, CKMB, CKMBINDEX, TROPONINI in the last 168 hours. BNP (last 3 results) No results for input(s): PROBNP in the last 8760 hours. HbA1C: No results for input(s): HGBA1C in the last 72 hours. CBG: No results for input(s): GLUCAP in the last 168 hours. Lipid Profile: No results for input(s): CHOL, HDL, LDLCALC, TRIG, CHOLHDL, LDLDIRECT in the last 72 hours. Thyroid Function Tests: No results for input(s): TSH, T4TOTAL, FREET4, T3FREE, THYROIDAB in the last 72 hours. Anemia Panel: No results for input(s): VITAMINB12, FOLATE, FERRITIN, TIBC, IRON, RETICCTPCT in the last 72 hours. Sepsis Labs: No results for input(s): PROCALCITON, LATICACIDVEN in the last 168 hours.  No results found for this or any previous visit (from the past 240 hour(s)).       Radiology  Studies: No results found.      Scheduled Meds:  aspirin  81 mg Oral BID   atorvastatin  10 mg Oral Daily   donepezil  5 mg Oral QHS   levothyroxine  100 mcg Oral Once per day on Sun Fri Sat   levothyroxine  50 mcg Oral Once per day on Mon Tue Wed Thu   memantine  5 mg Oral Daily   Pirfenidone  801 mg Oral TID   Continuous Infusions:   LOS: 28 days    Time spent: 15 mins.More than 50% of that time was spent in counseling and/or coordination of care.      Wed, MD [facility] P9/23/2022, 8:13 AM

## 2021-06-28 NOTE — Progress Notes (Signed)
Physical Therapy Treatment Patient Details Name: Catherine Terrell MRN: [id number] DOB: February 03, 1933 Today's Date: 06/28/2021   History of Present Illness 87 yo female adm 05/31/21 after fall resulting in L IT hip fx. Pt s/p s/p ORIF IM Nail 06/02/21.  PMH: bil TKAs (done in [place]), dementia, interstitial lung disease.    PT Comments    Patient limited today by fatigue as she had been OOB for increased time today with RN staff. Pt initially refusing to  mobilize but after son arrived pt had BM in bed and agreeable to get up to clean up and complete toileting. Min-Mod assist required to complete bed mobility and bring LE's off EOB. Min assist to rise and pivot to [facility]. Pt clearly frustrated and shaking walker at therapist while attempting to move to recliner. Pt refusing and dropped to EOB instead. Lateral scoot completed to move up to [facility] before returning to supine. She will benefit from additional therapy to progress strength, endurance, activity tolerance to move be able to tolerate flight home to [place]. Anticipate pt will be frustrated with fatigue during therapy as she is now being pushed to mobilize more throughout the day; however, this is important to continue. Acute PT will continue to progress as able.     Recommendations for follow up therapy are one component of a multi-disciplinary discharge planning process, led by the attending physician.  Recommendations may be updated based on patient status, additional functional criteria and insurance authorization.  Follow Up Recommendations  Other (comment) (pt plans on dc'ing to airplane to fly back to [place] with son on Oct 4th)     Equipment Recommendations  Rolling walker with 5" wheels    Recommendations for Other Services       Precautions / Restrictions Precautions Precautions: Fall Precaution Comments: HR goes up, sob with increased activity Restrictions Weight Bearing Restrictions: Yes LLE Weight Bearing: Partial weight bearing LLE  Partial Weight Bearing Percentage or Pounds: 50%     Mobility  Bed Mobility Overal bed mobility: Needs Assistance Bed Mobility: Supine to Sit;Sit to Supine     Supine to sit: Min assist;HOB elevated;Mod assist Sit to supine: Mod assist   General bed mobility comments: Pt required min-mod assist to initiate mobility to EOB. Mod assist to return to supine in bed.    Transfers Overall transfer level: Needs assistance Equipment used: Rolling walker (2 wheeled) Transfers: Sit to/from Stand;Lateral/Scoot Transfers;Stand Pivot Transfers Sit to Stand: Min assist Stand pivot transfers: Min assist      Lateral/Scoot Transfers: Min assist General transfer comment: tactile/verbal cues to move EOB to [facility] with RW, son out of room as pt toileting. Min assist to rise and manage walker with turn. Pt stoode 2x from [facility] for pericare, RN in room to assist. Min assist to return back to EOB despite repeated visual cues to amb forward to recliner. Pt heavily resistant on going to recliner and plopped down on bed after shaking walker around. pt completed 3x latearl scoots towards HOB with min guard prior to return to supine.  Ambulation/Gait                 Stairs             Wheelchair Mobility    Modified Rankin (Stroke Patients Only)       Balance Overall balance assessment: Needs assistance Sitting-balance support: Feet supported Sitting balance-Leahy Scale: Poor Sitting balance - Comments: posterior leaning   Standing balance support: Bilateral upper extremity supported Standing balance-Leahy  Scale: Poor Standing balance comment: reliant on external assistance                            Cognition Arousal/Alertness: Awake/alert Behavior During Therapy: WFL for tasks assessed/performed Overall Cognitive Status: Within Functional Limits for tasks assessed                                 General Comments: pt good with gestures, son translating  this session      Exercises      General Comments        Pertinent Vitals/Pain Pain Assessment: Faces Faces Pain Scale: Hurts even more Pain Location: L hip with activity Pain Descriptors / Indicators: Grimacing Pain Intervention(s): Limited activity within patient's tolerance;Monitored during session;Repositioned    Home Living                      Prior Function            PT Goals (current goals can now be found in the care plan section) Acute Rehab PT Goals Patient Stated Goal: warm milk PT Goal Formulation: Patient unable to participate in goal setting Time For Goal Achievement: 07/09/21 Potential to Achieve Goals: Fair Progress towards PT goals: Progressing toward goals    Frequency    Min 5X/week      PT Plan Current plan remains appropriate    Co-evaluation              AM-PAC PT "6 Clicks" Mobility   Outcome Measure  Help needed turning from your back to your side while in a flat bed without using bedrails?: A Little Help needed moving from lying on your back to sitting on the side of a flat bed without using bedrails?: A Little Help needed moving to and from a bed to a chair (including a wheelchair)?: A Little Help needed standing up from a chair using your arms (e.g., wheelchair or bedside chair)?: A Little Help needed to walk in hospital room?: A Lot Help needed climbing 3-5 steps with a railing? : Total 6 Click Score: 15    End of Session Equipment Utilized During Treatment: Gait belt Activity Tolerance: Patient tolerated treatment well Patient left: in chair;with call bell/phone within reach;with chair alarm set;with family/visitor present Nurse Communication: Mobility status PT Visit Diagnosis: Other abnormalities of gait and mobility (R26.89)     Time: 4270-6237 PT Time Calculation (min) (ACUTE ONLY): 26 min  Charges:  $Therapeutic Activity: 23-37 mins                     Wynn Maudlin, DPT Acute Rehabilitation  Services Office [phone] Pager [phone]    Catherine Terrell 06/28/2021, 7:22 PM

## 2021-06-29 DIAGNOSIS — S72002A Fracture of unspecified part of neck of left femur, initial encounter for closed fracture: Secondary | ICD-10-CM | POA: Diagnosis not present

## 2021-06-29 MED ORDER — INFLUENZA VAC A&B SA ADJ QUAD 0.5 ML IM PRSY
0.5000 mL | PREFILLED_SYRINGE | INTRAMUSCULAR | Status: AC
  Administered 2021-06-30: 0.5 mL via INTRAMUSCULAR
  Filled 2021-06-29: qty 0.5

## 2021-06-29 NOTE — Progress Notes (Signed)
PROGRESS NOTE    Catherine Terrell  FGH:[id number] DOB: Feb 12, 1933 DOA: 05/31/2021 PCP: System, Provider Not In   Chief Complain: Fall  Brief Narrative:  Patient is a 87 year old female with history of interstitial lung disease, hypothyroidism, hyperlipidemia, cognitive impairment who presented secondary to fall.  After fall,she developed left hip pain.  Imaging showed left hip fracture.  Orthopedics consulted and underwent IM nailing.  Hospital course remarkable for acute blood loss anemia requiring 2 L units of PRBC.  PT/OT recommended skilled nursing facility on discharge but placement is difficult due to lack of insurance.  Patient unable to go back to home.  Medically stable for discharge.  TOC following  Assessment & Plan:   Principal Problem:   Closed left hip fracture (HCC) Active Problems:   Interstitial lung disease (HCC)   Hypothyroidism   MCI (mild cognitive impairment)   Left hip fracture: Secondary to fall in the bathroom.After fall,she developed left hip pain.  Imaging showed left hip fracture.  Orthopedics consulted and underwent IM nailing.  On aspirin twice daily for DVT prophylaxis.  Acute blood loss anemia: Postoperatively.  Underwent 2 units  of blood transfusion.  Currently hemoglobin stable  History of interstitial lung disease/COPD pulmonary fibrosis/chronic respiratory failure with hypoxia: Currently stable on room air.  Follows with pulm as an outpatient.  On pirfenidone  Hypothyroidism: On Synthyroid  Cognitive impairment/dementia: Confused at baseline.  On Aricept.  Monitor mental status.  Delirium precautions  Acute urinary retention: Resolved.  Foley taken out  Hyperlipidemia :on Lipitor  Mildly elevated liver enzymes: Unclear etiology.  No abdominal pain.  Ultrasound of the right upper quadrant did not show any obvious pathology of the liver parenchyma  Hyponatremia: Mild, continue to monitor  Disposition: Patient is originally from [place], lives  with her youngest son.  She came to visit her elder son.  At her son's place, she fell in the bathroom near commode and broke her hip.  She has insurance from [place] with does not work in [place].  Her son is not able to take care of her here.  That is why she had prolonged hospitalization.  Her youngest son in [place] is traveling and is currently not at home.  Plan is to go to [place] in October for when her son will be back.TOC is also looking for short-term rehab over here.         DVT prophylaxis:Aspirin Code Status: Full Family Communication: Son at bedside on 06/22/21 Status is: Inpatient  Remains inpatient appropriate because:Unsafe d/c plan  Dispo: The patient is from: Home              Anticipated d/c is to: SNF              Patient currently is medically stable for discharge   difficult to place patient Yes    Consultants: Orthopedics  Procedures:ORIF  Antimicrobials:  Anti-infectives (From admission, onward)    Start     Dose/Rate Route Frequency Ordered Stop   06/02/21 1830  ceFAZolin (ANCEF) IVPB 2g/100 mL premix        2 g 200 mL/hr over 30 Minutes Intravenous Every 6 hours 06/02/21 1454 06/03/21 0117   06/02/21 0830  ceFAZolin (ANCEF) IVPB 2g/100 mL premix        2 g 200 mL/hr over 30 Minutes Intravenous On call to O.R. 06/02/21 0744 06/02/21 1228   06/02/21 0600  ceFAZolin (ANCEF) IVPB 2g/100 mL premix  Status:  Discontinued  2 g 200 mL/hr over 30 Minutes Intravenous On call to O.R. 06/01/21 1333 06/02/21 0748       Subjective:  Patient seen and examined the bedside this morning.  Hemodynamically stable.  Sleeping comfortably  Objective: Vitals:   06/27/21 2151 06/28/21 1447 06/28/21 2248 06/29/21 0538  BP: (!) 144/84 93/68 133/65 (!) 134/59  Pulse: 89 100 88 86  Resp: 15 20 18 16   Temp:  97.6 F (36.4 C) 98.3 F (36.8 C) 97.6 F (36.4 C)  TempSrc:  Oral Oral Oral  SpO2: 100% 99% 98% 97%  Weight:      Height:         Intake/Output Summary (Last 24 hours) at 06/29/2021 1113 Last data filed at 06/29/2021 1000 Gross per 24 hour  Intake 595 ml  Output 200 ml  Net 395 ml   Filed Weights   05/31/21 2309 06/02/21 0233  Weight: 46.8 kg 46.8 kg    Examination:  General exam: Comfortable, not in distress, lying in bed, sleeping   Data Reviewed: I have personally reviewed following labs and imaging studies  CBC: No results for input(s): WBC, NEUTROABS, HGB, HCT, MCV, PLT in the last 168 hours. Basic Metabolic Panel: No results for input(s): NA, K, CL, CO2, GLUCOSE, BUN, CREATININE, CALCIUM, MG, PHOS in the last 168 hours.  GFR: Estimated Creatinine Clearance: 32.3 mL/min (by C-G formula based on SCr of 0.58 mg/dL). Liver Function Tests: No results for input(s): AST, ALT, ALKPHOS, BILITOT, PROT, ALBUMIN in the last 168 hours.  No results for input(s): LIPASE, AMYLASE in the last 168 hours. No results for input(s): AMMONIA in the last 168 hours. Coagulation Profile: No results for input(s): INR, PROTIME in the last 168 hours. Cardiac Enzymes: No results for input(s): CKTOTAL, CKMB, CKMBINDEX, TROPONINI in the last 168 hours. BNP (last 3 results) No results for input(s): PROBNP in the last 8760 hours. HbA1C: No results for input(s): HGBA1C in the last 72 hours. CBG: No results for input(s): GLUCAP in the last 168 hours. Lipid Profile: No results for input(s): CHOL, HDL, LDLCALC, TRIG, CHOLHDL, LDLDIRECT in the last 72 hours. Thyroid Function Tests: No results for input(s): TSH, T4TOTAL, FREET4, T3FREE, THYROIDAB in the last 72 hours. Anemia Panel: No results for input(s): VITAMINB12, FOLATE, FERRITIN, TIBC, IRON, RETICCTPCT in the last 72 hours. Sepsis Labs: No results for input(s): PROCALCITON, LATICACIDVEN in the last 168 hours.  No results found for this or any previous visit (from the past 240 hour(s)).       Radiology Studies: No results found.      Scheduled Meds:   aspirin  81 mg Oral BID   atorvastatin  10 mg Oral Daily   donepezil  5 mg Oral QHS   levothyroxine  100 mcg Oral Once per day on Sun Fri Sat   levothyroxine  50 mcg Oral Once per day on Mon Tue Wed Thu   memantine  5 mg Oral Daily   Pirfenidone  801 mg Oral TID   Continuous Infusions:   LOS: 29 days    Time spent: 15 mins.More than 50% of that time was spent in counseling and/or coordination of care.      Fri, MD [facility] P9/24/2022, 11:13 AM

## 2021-06-29 NOTE — Progress Notes (Signed)
Patient is alert and oriented. No signs or symptoms of acute distress; noted that patients appears to experience mild to mod discomfort when swallowing liquids. Patient grasp at throat and regurgitates some of the fluid. Per nursing staff familiar with the patients this has not been the norm for patient. Dr Renford Dills paged and notified. Will continue to monitor.

## 2021-06-29 NOTE — Progress Notes (Signed)
Physical Therapy Treatment Patient Details Name: Catherine Terrell MRN: [id number] DOB: 12/31/1932 Today's Date: 06/29/2021   History of Present Illness 87 yo female adm 05/31/21 after fall resulting in L IT hip fx. Pt s/p s/p ORIF IM Nail 06/02/21.  PMH: bil TKAs (done in [place]), dementia, interstitial lung disease.    PT Comments    Pt ambulated 10' with RW, with +2 mod assist. Max encouragement required for pt to participate. Pt with 3/4 dyspnea after ambulation, SAO2 98%, HR 106, BP 126/69. +2 mod assist for sit to stand. Pt's activity tolerance is declining. Pt's son present for tx, he stated pt is eating very little and seems to have "given up". He is hoping she can fly home to [place] on Oct 4.    Recommendations for follow up therapy are one component of a multi-disciplinary discharge planning process, led by the attending physician.  Recommendations may be updated based on patient status, additional functional criteria and insurance authorization.  Follow Up Recommendations  Other (comment) (pt plans on dc'ing to airplane to fly back to [place] with son on Oct 4th)     Equipment Recommendations  Rolling walker with 5" wheels    Recommendations for Other Services       Precautions / Restrictions Precautions Precautions: Fall Precaution Comments: HR goes up, sob with increased activity Restrictions Weight Bearing Restrictions: Yes LLE Weight Bearing: Partial weight bearing LLE Partial Weight Bearing Percentage or Pounds: 50%     Mobility  Bed Mobility               General bed mobility comments: up in recliner    Transfers Overall transfer level: Needs assistance Equipment used: Rolling walker (2 wheeled) Transfers: Sit to/from Stand Sit to Stand: Mod assist;+2 physical assistance         General transfer comment: tactile/verbal cues for hand placement and to encourage participation, +2 mod A to power up  Ambulation/Gait Ambulation/Gait assistance: Mod assist;+2  safety/equipment Gait Distance (Feet): 10 Feet Assistive device: Rolling walker (2 wheeled) Gait Pattern/deviations: Step-to pattern;Trunk flexed;Narrow base of support;Staggering left;Staggering right Gait velocity: decr   General Gait Details: pt requires manual cues/assist to manage walker position as she has a tendency to push walker too far ahead and walks with Lt hip internally rotated and abducted. Mod assist required to steady balance and to manage walker. Pt's son pushed chair for follow. Pt SOB after walking, SaO2 98% on room air, HR 106, BP 126/69.   Stairs             Wheelchair Mobility    Modified Rankin (Stroke Patients Only)       Balance Overall balance assessment: Needs assistance Sitting-balance support: Feet supported Sitting balance-Leahy Scale: Poor Sitting balance - Comments: posterior leaning   Standing balance support: Bilateral upper extremity supported Standing balance-Leahy Scale: Poor Standing balance comment: reliant on external assistance                            Cognition Arousal/Alertness: Awake/alert Behavior During Therapy: WFL for tasks assessed/performed Overall Cognitive Status: Within Functional Limits for tasks assessed                                 General Comments: pt good with gestures, son translating this session      Exercises General Exercises - Lower Extremity Short Arc Quad: AROM;Left;10 reps;Supine  Long Arc Quad: AAROM;10 reps;Left Heel Slides: AAROM;Left;10 reps Hip ABduction/ADduction: AAROM;Left;10 reps    General Comments        Pertinent Vitals/Pain Faces Pain Scale: Hurts even more Pain Location: L hip and low back with activity Pain Descriptors / Indicators: Grimacing;Moaning Pain Intervention(s): Limited activity within patient's tolerance;Monitored during session;Repositioned    Home Living                      Prior Function            PT Goals (current  goals can now be found in the care plan section) Acute Rehab PT Goals Patient Stated Goal: family would like pt to be able to fly home to [place] Oct 4 PT Goal Formulation: With family Time For Goal Achievement: 07/09/21 Potential to Achieve Goals: Fair Progress towards PT goals: Not progressing toward goals - comment (activity tolerance declining)    Frequency    Min 5X/week      PT Plan Current plan remains appropriate    Co-evaluation              AM-PAC PT "6 Clicks" Mobility   Outcome Measure  Help needed turning from your back to your side while in a flat bed without using bedrails?: A Little Help needed moving from lying on your back to sitting on the side of a flat bed without using bedrails?: A Little Help needed moving to and from a bed to a chair (including a wheelchair)?: A Lot Help needed standing up from a chair using your arms (e.g., wheelchair or bedside chair)?: A Lot Help needed to walk in hospital room?: A Lot Help needed climbing 3-5 steps with a railing? : Total 6 Click Score: 13    End of Session Equipment Utilized During Treatment: Gait belt Activity Tolerance: Patient limited by fatigue Patient left: in chair;with call bell/phone within reach;with chair alarm set;with family/visitor present Nurse Communication: Mobility status PT Visit Diagnosis: Other abnormalities of gait and mobility (R26.89)     Time: 9093-1121 PT Time Calculation (min) (ACUTE ONLY): 28 min  Charges:  $Gait Training: 8-22 mins $Therapeutic Exercise: 8-22 mins                    Ralene Bathe Kistler PT 06/29/2021  Acute Rehabilitation Services Pager [phone] Office [phone]

## 2021-06-30 DIAGNOSIS — S72002A Fracture of unspecified part of neck of left femur, initial encounter for closed fracture: Secondary | ICD-10-CM | POA: Diagnosis not present

## 2021-06-30 NOTE — Progress Notes (Signed)
Occupational Therapy Treatment Patient Details Name: Catherine Terrell MRN: [id number] DOB: 03/25/1933 Today's Date: 06/30/2021   History of present illness 87 yo female adm 05/31/21 after fall resulting in L IT hip fx. Pt s/p s/p ORIF IM Nail 06/02/21.  PMH: bil TKAs (done in [place]), dementia, interstitial lung disease.   OT comments  Prolonged amount of time in room due to conversation with patient's son in regards to POC and patient's presentation. Patient found seated in recliner begging to go back to bed despite only being in the chair for a few minutes. Patient overall min assist to stand - with cues to use upper extremities - patient's LLE internally rotated and foot positioned laterally - making sit to stand more difficult. Patient min assist to ambulate to [facility] with left hip circumducting and perform BSC transfer. Max assist for toileting. Patient constantly asking to return to bed and needing a mattress for rest. Education and encouragement for patient to stay out of bed - but patient unable to maintain comprehension. Son in room translating. Patient can be cued to use english - for simple communication.    Recommendations for follow up therapy are one component of a multi-disciplinary discharge planning process, led by the attending physician.  Recommendations may be updated based on patient status, additional functional criteria and insurance authorization.    Follow Up Recommendations  SNF    Equipment Recommendations  3 in 1 bedside commode    Recommendations for Other Services      Precautions / Restrictions Precautions Precautions: Fall Precaution Comments: HR goes up, sob with increased activity Restrictions Weight Bearing Restrictions: Yes LLE Weight Bearing: Partial weight bearing LLE Partial Weight Bearing Percentage or Pounds: 50%       Mobility Bed Mobility               General bed mobility comments: up in recliner    Transfers Overall transfer level: Needs  assistance Equipment used: Rolling walker (2 wheeled) Transfers: Sit to/from Stand Sit to Stand: Mod assist;Min assist Stand pivot transfers: Min assist       General transfer comment: Min-Mod A for sit to stand, depending on hand/feet placement. Multimodal cueing required.    Balance Overall balance assessment: Needs assistance;History of Falls Sitting-balance support: Feet supported Sitting balance-Leahy Scale: Fair     Standing balance support: Bilateral upper extremity supported Standing balance-Leahy Scale: Poor Standing balance comment: reliant on external assistance                           ADL either performed or assessed with clinical judgement   ADL Overall ADL's : Needs assistance/impaired                         Toilet Transfer: Minimal assistance;Ambulation;RW;BSC Toilet Transfer Details (indicate cue type and reason): min assist for powering up and cues to use arms. Toileting- Clothing Manipulation and Hygiene: Maximal assistance;Sitting/lateral lean Toileting - Clothing Manipulation Details (indicate cue type and reason): needs assistance for clothing management - did not need cleaning as she did not void.             Vision   Vision Assessment?: No apparent visual deficits   Perception     Praxis      Cognition Arousal/Alertness: Awake/alert Behavior During Therapy: WFL for tasks assessed/performed Overall Cognitive Status: Difficult to assess  General Comments: pt good with gestures, son translating this session - knows some english. Has history of dementia        Exercises     Shoulder Instructions       General Comments      Pertinent Vitals/ Pain       Pain Assessment: Faces Pain Score: 0-No pain Faces Pain Scale: Hurts little more Pain Descriptors / Indicators: Discomfort;Sore Pain Intervention(s): Monitored during session  Home Living                                           Prior Functioning/Environment              Frequency  Min 2X/week        Progress Toward Goals  OT Goals(current goals can now be found in the care plan section)  Progress towards OT goals: Progressing toward goals  Acute Rehab OT Goals Patient Stated Goal: family would like pt to be able to fly home to [place] Oct 4 OT Goal Formulation: With family Time For Goal Achievement: 07/09/21 Potential to Achieve Goals: Fair  Plan Discharge plan remains appropriate    Co-evaluation      Reason for Co-Treatment: Necessary to address cognition/behavior during functional activity;For patient/therapist safety PT goals addressed during session: Mobility/safety with mobility OT goals addressed during session: ADL's and self-care      AM-PAC OT "6 Clicks" Daily Activity     Outcome Measure   Help from another person eating meals?: None Help from another person taking care of personal grooming?: A Little Help from another person toileting, which includes using toliet, bedpan, or urinal?: A Lot Help from another person bathing (including washing, rinsing, drying)?: A Lot Help from another person to put on and taking off regular upper body clothing?: A Little Help from another person to put on and taking off regular lower body clothing?: A Lot 6 Click Score: 16    End of Session Equipment Utilized During Treatment: Gait belt;Rolling walker  OT Visit Diagnosis: History of falling (Z91.81);Other abnormalities of gait and mobility (R26.89);Unsteadiness on feet (R26.81)   Activity Tolerance Patient limited by fatigue   Patient Left in chair;with call bell/phone within reach;with chair alarm set;with family/visitor present   Nurse Communication Mobility status        Time: 0962-8366 OT Time Calculation (min): 37 min  Charges: OT General Charges $OT Visit: 1 Visit OT Treatments $Self Care/Home Management : 8-22 mins  Waldron Session, OTR/L Acute Care  Rehab Services  Office [phone] Pager: [phone]   Kelli Churn 06/30/2021, 4:20 PM

## 2021-06-30 NOTE — Progress Notes (Signed)
PROGRESS NOTE    Catherine Terrell  FGH:[id number] DOB: Feb 12, 1933 DOA: 05/31/2021 PCP: System, Provider Not In   Chief Complain: Fall  Brief Narrative:  Patient is a 87 year old female with history of interstitial lung disease, hypothyroidism, hyperlipidemia, cognitive impairment who presented secondary to fall.  After fall,she developed left hip pain.  Imaging showed left hip fracture.  Orthopedics consulted and underwent IM nailing.  Hospital course remarkable for acute blood loss anemia requiring 2 L units of PRBC.  PT/OT recommended skilled nursing facility on discharge but placement is difficult due to lack of insurance.  Patient unable to go back to home.  Medically stable for discharge.  TOC following  Assessment & Plan:   Principal Problem:   Closed left hip fracture (HCC) Active Problems:   Interstitial lung disease (HCC)   Hypothyroidism   MCI (mild cognitive impairment)   Left hip fracture: Secondary to fall in the bathroom.After fall,she developed left hip pain.  Imaging showed left hip fracture.  Orthopedics consulted and underwent IM nailing.  On aspirin twice daily for DVT prophylaxis.  Acute blood loss anemia: Postoperatively.  Underwent 2 units  of blood transfusion.  Currently hemoglobin stable  History of interstitial lung disease/COPD pulmonary fibrosis/chronic respiratory failure with hypoxia: Currently stable on room air.  Follows with pulm as an outpatient.  On pirfenidone  Hypothyroidism: On Synthyroid  Cognitive impairment/dementia: Confused at baseline.  On Aricept.  Monitor mental status.  Delirium precautions  Acute urinary retention: Resolved.  Foley taken out  Hyperlipidemia :on Lipitor  Mildly elevated liver enzymes: Unclear etiology.  No abdominal pain.  Ultrasound of the right upper quadrant did not show any obvious pathology of the liver parenchyma  Hyponatremia: Mild, continue to monitor  Disposition: Patient is originally from [place], lives  with her youngest son.  She came to visit her elder son.  At her son's place, she fell in the bathroom near commode and broke her hip.  She has insurance from [place] with does not work in [place].  Her son is not able to take care of her here.  That is why she had prolonged hospitalization.  Her youngest son in [place] is traveling and is currently not at home.  Plan is to go to [place] in October for when her son will be back.TOC is also looking for short-term rehab over here.         DVT prophylaxis:Aspirin Code Status: Full Family Communication: Son at bedside on 06/22/21 Status is: Inpatient  Remains inpatient appropriate because:Unsafe d/c plan  Dispo: The patient is from: Home              Anticipated d/c is to: SNF              Patient currently is medically stable for discharge   difficult to place patient Yes    Consultants: Orthopedics  Procedures:ORIF  Antimicrobials:  Anti-infectives (From admission, onward)    Start     Dose/Rate Route Frequency Ordered Stop   06/02/21 1830  ceFAZolin (ANCEF) IVPB 2g/100 mL premix        2 g 200 mL/hr over 30 Minutes Intravenous Every 6 hours 06/02/21 1454 06/03/21 0117   06/02/21 0830  ceFAZolin (ANCEF) IVPB 2g/100 mL premix        2 g 200 mL/hr over 30 Minutes Intravenous On call to O.R. 06/02/21 0744 06/02/21 1228   06/02/21 0600  ceFAZolin (ANCEF) IVPB 2g/100 mL premix  Status:  Discontinued  2 g 200 mL/hr over 30 Minutes Intravenous On call to O.R. 06/01/21 1333 06/02/21 0748       Subjective:  Patient seen and examined at the bedside this morning.  Hemodynamically stable.  Denies any complaints today.  Eating her breakfast Objective: Vitals:   06/29/21 0538 06/29/21 1424 06/29/21 2019 06/30/21 0425  BP: (!) 134/59 92/67 (!) 146/68 136/66  Pulse: 86 97 83 83  Resp: 16 16 17 18   Temp: 97.6 F (36.4 C)  98 F (36.7 C) (!) 97.5 F (36.4 C)  TempSrc: Oral  Oral   SpO2: 97% 98% 100% 100%   Weight:      Height:        Intake/Output Summary (Last 24 hours) at 06/30/2021 0803 Last data filed at 06/30/2021 0600 Gross per 24 hour  Intake 240 ml  Output 1150 ml  Net -910 ml   Filed Weights   05/31/21 2309 06/02/21 0233  Weight: 46.8 kg 46.8 kg    Examination:  General exam: Comfortable, not in any kind of distress, sitting on the bed and eating breakfast   Data Reviewed: I have personally reviewed following labs and imaging studies  CBC: No results for input(s): WBC, NEUTROABS, HGB, HCT, MCV, PLT in the last 168 hours. Basic Metabolic Panel: No results for input(s): NA, K, CL, CO2, GLUCOSE, BUN, CREATININE, CALCIUM, MG, PHOS in the last 168 hours.  GFR: Estimated Creatinine Clearance: 32.3 mL/min (by C-G formula based on SCr of 0.58 mg/dL). Liver Function Tests: No results for input(s): AST, ALT, ALKPHOS, BILITOT, PROT, ALBUMIN in the last 168 hours.  No results for input(s): LIPASE, AMYLASE in the last 168 hours. No results for input(s): AMMONIA in the last 168 hours. Coagulation Profile: No results for input(s): INR, PROTIME in the last 168 hours. Cardiac Enzymes: No results for input(s): CKTOTAL, CKMB, CKMBINDEX, TROPONINI in the last 168 hours. BNP (last 3 results) No results for input(s): PROBNP in the last 8760 hours. HbA1C: No results for input(s): HGBA1C in the last 72 hours. CBG: No results for input(s): GLUCAP in the last 168 hours. Lipid Profile: No results for input(s): CHOL, HDL, LDLCALC, TRIG, CHOLHDL, LDLDIRECT in the last 72 hours. Thyroid Function Tests: No results for input(s): TSH, T4TOTAL, FREET4, T3FREE, THYROIDAB in the last 72 hours. Anemia Panel: No results for input(s): VITAMINB12, FOLATE, FERRITIN, TIBC, IRON, RETICCTPCT in the last 72 hours. Sepsis Labs: No results for input(s): PROCALCITON, LATICACIDVEN in the last 168 hours.  No results found for this or any previous visit (from the past 240 hour(s)).       Radiology  Studies: No results found.      Scheduled Meds:  aspirin  81 mg Oral BID   atorvastatin  10 mg Oral Daily   donepezil  5 mg Oral QHS   influenza vaccine adjuvanted  0.5 mL Intramuscular Tomorrow-1000   levothyroxine  100 mcg Oral Once per day on Sun Fri Sat   levothyroxine  50 mcg Oral Once per day on Mon Tue Wed Thu   memantine  5 mg Oral Daily   Pirfenidone  801 mg Oral TID   Continuous Infusions:   LOS: 30 days    Time spent: 15 mins.More than 50% of that time was spent in counseling and/or coordination of care.      Sun, MD [facility] P9/25/2022, 8:03 AM

## 2021-06-30 NOTE — Progress Notes (Addendum)
Physical Therapy Treatment Patient Details Name: Catherine Terrell MRN: [id number] DOB: [birth year] Today's Date: 06/30/2021   History of Present Illness 87 yo female adm 05/31/21 after fall resulting in L IT hip fx. Pt s/p s/p ORIF IM Nail 06/02/21.  PMH: bil TKAs (done in [place]), dementia, interstitial lung disease.    PT Comments    Encouragement from son and therapist(s) required. Pt fixated on returning to bed to sleep-"I cannot sleep here in the chair." Son explained to pt importance of spending time OOB. Pt stated she felt this was "punishment". Encouraged son to try to convince her to sit up for a bit longer before nursing helps her back to bed. Pt performance seems to fluctuate. Will continue to follow and progress activity as pt allows/is able to tolerate.    Recommendations for follow up therapy are one component of a multi-disciplinary discharge planning process, led by the attending physician.  Recommendations may be updated based on patient status, additional functional criteria and insurance authorization.  Follow Up Recommendations   (plan to d/c back to [place] on plane Oct 4th? Son is hopeful for rehab placement once back in [place])     Equipment Recommendations  Rolling walker with 5" wheels    Recommendations for Other Services       Precautions / Restrictions Precautions Precautions: Fall Precaution Comments: HR goes up, sob with increased activity Restrictions Weight Bearing Restrictions: Yes LLE Weight Bearing: Partial weight bearing LLE Partial Weight Bearing Percentage or Pounds: 50%     Mobility  Bed Mobility               General bed mobility comments: up in recliner    Transfers Overall transfer level: Needs assistance Equipment used: Rolling walker (2 wheeled) Transfers: Sit to/from Stand Sit to Stand: Mod assist;Min assist Stand pivot transfers: Min assist       General transfer comment: Min-Mod A for sit to stand, depending on hand/feet placement.  Multimodal cueing required.  Ambulation/Gait Ambulation/Gait assistance: Min assist Gait Distance (Feet): 25 Feet (25'x1; 7'x2) Assistive device: Rolling walker (2 wheeled) Gait Pattern/deviations: Step-to pattern;Trunk flexed;Narrow base of support;Scissoring (L LE)     General Gait Details: Multimodal cueing required. Assist to steady pt and manage RW proximity. Poor L LE/foot placement/control noted during ambulation-circumduction, scissoring, internal rotation. Pt walked in hallway. After seated rest break, she then walked to/from bsc with RW. Pt attempts to sit before safely positioned.   Stairs             Wheelchair Mobility    Modified Rankin (Stroke Patients Only)       Balance Overall balance assessment: Needs assistance;History of Falls         Standing balance support: Bilateral upper extremity supported Standing balance-Leahy Scale: Poor                              Cognition Arousal/Alertness: Awake/alert Behavior During Therapy: WFL for tasks assessed/performed Overall Cognitive Status: Difficult to assess (hx of dementia)                                 General Comments: pt good with gestures, son translating this session      Exercises      General Comments        Pertinent Vitals/Pain Pain Assessment: Faces Faces Pain Scale: Hurts little more Pain Descriptors /  Indicators: Discomfort;Sore Pain Intervention(s): Limited activity within patient's tolerance;Monitored during session;Repositioned    Home Living                      Prior Function            PT Goals (current goals can now be found in the care plan section) Progress towards PT goals: Progressing toward goals    Frequency    Min 5X/week      PT Plan Current plan remains appropriate    Co-evaluation              AM-PAC PT "6 Clicks" Mobility   Outcome Measure  Help needed turning from your back to your side while in a  flat bed without using bedrails?: A Little Help needed moving from lying on your back to sitting on the side of a flat bed without using bedrails?: A Lot Help needed moving to and from a bed to a chair (including a wheelchair)?: A Little Help needed standing up from a chair using your arms (e.g., wheelchair or bedside chair)?: A Lot Help needed to walk in hospital room?: A Lot Help needed climbing 3-5 steps with a railing? : Total 6 Click Score: 13    End of Session Equipment Utilized During Treatment: Gait belt Activity Tolerance: Patient limited by fatigue Patient left: in chair;with call bell/phone within reach;with chair alarm set;with family/visitor present   PT Visit Diagnosis: Other abnormalities of gait and mobility (R26.89);Muscle weakness (generalized) (M62.81)     Time: 6945-0388 PT Time Calculation (min) (ACUTE ONLY): 22 min  Charges:  $Gait Training: 8-22 mins                         Faye Ramsay, PT Acute Rehabilitation  Office: [phone] Pager: [phone]

## 2021-07-01 DIAGNOSIS — S72002A Fracture of unspecified part of neck of left femur, initial encounter for closed fracture: Secondary | ICD-10-CM | POA: Diagnosis not present

## 2021-07-01 MED ORDER — COVID-19MRNA BIVAL VACC PFIZER 30 MCG/0.3ML IM SUSP
0.3000 mL | Freq: Once | INTRAMUSCULAR | Status: AC
  Administered 2021-07-02: 0.3 mL via INTRAMUSCULAR
  Filled 2021-07-01: qty 0.3

## 2021-07-01 NOTE — Progress Notes (Signed)
Physical Therapy Treatment Patient Details Name: Catherine Terrell MRN: [id number] DOB: [birth year] Today's Date: 07/01/2021   History of Present Illness 87 yo female adm 05/31/21 after fall resulting in L IT hip fx. Pt s/p s/p ORIF IM Nail 06/02/21.  PMH: bil TKAs (done in [place]), dementia, interstitial lung disease.    PT Comments    Pt is progressing with mobility, decr assist required with gait however pt does continue to scissor and demonstrate excessive IR L hip. Continue to follow in acute setting.  Recommendations for follow up therapy are one component of a multi-disciplinary discharge planning process, led by the attending physician.  Recommendations may be updated based on patient status, additional functional criteria and insurance authorization.  Follow Up Recommendations        Equipment Recommendations  Rolling walker with 5" wheels    Recommendations for Other Services       Precautions / Restrictions Precautions Precautions: Fall Precaution Comments: minimal DOE today Restrictions LLE Weight Bearing: Partial weight bearing LLE Partial Weight Bearing Percentage or Pounds: 50%     Mobility  Bed Mobility Overal bed mobility: Needs Assistance Bed Mobility: Sit to Supine       Sit to supine: Min assist   General bed mobility comments: assist with LLE, multi-modal cues for positioning in bed    Transfers Overall transfer level: Needs assistance Equipment used: Rolling walker (2 wheeled) Transfers: Sit to/from Stand Sit to Stand: Min assist;Min guard         General transfer comment: min assist to min/guard to stand, visual cues for hand placement with pt demonstrating carryover from previous sessions  Ambulation/Gait Ambulation/Gait assistance: Min assist Gait Distance (Feet): 25 Feet Assistive device: Rolling walker (2 wheeled) Gait Pattern/deviations: Step-to pattern;Trunk flexed;Narrow base of support;Staggering left Gait velocity: decr   General  Gait Details: Multimodal cuing required. Assist to steady pt and manage RW proximity. Poor L LE/foot placement, control noted during ambulation-circumduction, scissoring, internal rotation.   Stairs             Wheelchair Mobility    Modified Rankin (Stroke Patients Only)       Balance   Sitting-balance support: Feet supported Sitting balance-Leahy Scale: Fair       Standing balance-Leahy Scale: Poor Standing balance comment: reliant on external assistance                            Cognition Arousal/Alertness: Awake/alert Behavior During Therapy: WFL for tasks assessed/performed Overall Cognitive Status: History of cognitive impairments - at baseline                                 General Comments: pt repsonds to visual cues. speaks some english at times      Exercises General Exercises - Lower Extremity Ankle Circles/Pumps: AROM;10 reps;Both Heel Slides: AAROM;AROM;Left;5 reps Hip ABduction/ADduction: AAROM;Left;10 reps    General Comments        Pertinent Vitals/Pain Pain Assessment: Faces Faces Pain Scale: Hurts a little bit Pain Location: L hip with bed mobility Pain Descriptors / Indicators: Grimacing Pain Intervention(s): Limited activity within patient's tolerance;Monitored during session    Home Living                      Prior Function            PT Goals (current goals can now  be found in the care plan section) Acute Rehab PT Goals Patient Stated Goal: family would like pt to be able to fly home to [facility] Oct 4 PT Goal Formulation: With family Time For Goal Achievement: 07/09/21 Potential to Achieve Goals: Fair Progress towards PT goals: Progressing toward goals    Frequency    Min 5X/week      PT Plan Current plan remains appropriate    Co-evaluation              AM-PAC PT "6 Clicks" Mobility   Outcome Measure  Help needed turning from your back to your side while in a flat bed  without using bedrails?: A Little Help needed moving from lying on your back to sitting on the side of a flat bed without using bedrails?: A Little Help needed moving to and from a bed to a chair (including a wheelchair)?: A Little Help needed standing up from a chair using your arms (e.g., wheelchair or bedside chair)?: A Little Help needed to walk in hospital room?: A Lot Help needed climbing 3-5 steps with a railing? : Total 6 Click Score: 15    End of Session Equipment Utilized During Treatment: Gait belt Activity Tolerance: Patient tolerated treatment well Patient left: in bed;with call bell/phone within reach;with bed alarm set Nurse Communication: Mobility status PT Visit Diagnosis: Other abnormalities of gait and mobility (R26.89);Muscle weakness (generalized) (M62.81)     Time: 2563-8937 PT Time Calculation (min) (ACUTE ONLY): 27 min  Charges:  $Gait Training: 8-22 mins $Therapeutic Exercise: 8-22 mins                     Delice Bison, PT  Acute Rehab Dept ([facility]) [phone] Pager [phone]  07/01/2021    [facility] 07/01/2021, 12:32 PM

## 2021-07-01 NOTE — Progress Notes (Signed)
OT Cancellation Note  Patient Details Name: Catherine Terrell MRN: [id number] DOB: 06-25-1933   Cancelled Treatment:    Reason Eval/Treat Not Completed: Other (comment) patient is sleeping at this time. Will check back as schedule allows.   Sharyn Blitz OTR/L, MS Acute Rehabilitation Department Office# [phone] Pager# [phone]  07/01/2021, 2:43 PM

## 2021-07-01 NOTE — Progress Notes (Signed)
PROGRESS NOTE    Catherine Terrell  ATF:[id number] DOB: 1933/01/17 DOA: 05/31/2021 PCP: System, Provider Not In   Chief Complain: Fall  Brief Narrative:  Patient is a 87 year old female with history of interstitial lung disease, hypothyroidism, hyperlipidemia, cognitive impairment who presented secondary to fall.  After fall,she developed left hip pain.  Imaging showed left hip fracture.  Orthopedics consulted and underwent IM nailing.  Hospital course remarkable for acute blood loss anemia requiring 2 L units of PRBC.  PT/OT recommended skilled nursing facility on discharge but placement is difficult due to lack of insurance.  Patient unable to go back to home.  Medically stable for discharge.  TOC following  Assessment & Plan:   Principal Problem:   Closed left hip fracture (HCC) Active Problems:   Interstitial lung disease (HCC)   Hypothyroidism   MCI (mild cognitive impairment)   Left hip fracture: Secondary to fall in the bathroom.After fall,she developed left hip pain.  Imaging showed left hip fracture.  Orthopedics consulted and underwent IM nailing.  On aspirin twice daily for DVT prophylaxis.  Acute blood loss anemia: Postoperatively.  Underwent 2 units  of blood transfusion.  Currently hemoglobin stable  History of interstitial lung disease/COPD pulmonary fibrosis/chronic respiratory failure with hypoxia: Currently stable on room air.  Follows with pulm as an outpatient.  On pirfenidone  Hypothyroidism: On Synthyroid  Cognitive impairment/dementia: Confused at baseline.  On Aricept.  Monitor mental status.  Delirium precautions  Acute urinary retention: Resolved.  Foley taken out  Hyperlipidemia :on Lipitor  Mildly elevated liver enzymes: Unclear etiology.  No abdominal pain.  Ultrasound of the right upper quadrant did not show any obvious pathology of the liver parenchyma  Hyponatremia: Mild, continue to monitor  Disposition: Patient is originally from [place], lives  with her youngest son.  She came to visit her elder son.  At her son's place, she fell in the bathroom near commode and broke her hip.  She has insurance from [place] with does not work in [place].  Her son is not able to take care of her here.  That is why she had prolonged hospitalization.  Her youngest son in [place] is traveling and is currently not at home.  Plan is to go to [place] in October for when her son will be back.TOC is also looking for short-term rehab over here.         DVT prophylaxis:Aspirin Code Status: Full Family Communication: Son at bedside on 06/22/21 Status is: Inpatient  Remains inpatient appropriate because:Unsafe d/c plan  Dispo: The patient is from: Home              Anticipated d/c is to: SNF              Patient currently is medically stable for discharge   difficult to place patient Yes    Consultants: Orthopedics  Procedures:ORIF  Antimicrobials:  Anti-infectives (From admission, onward)    Start     Dose/Rate Route Frequency Ordered Stop   06/02/21 1830  ceFAZolin (ANCEF) IVPB 2g/100 mL premix        2 g 200 mL/hr over 30 Minutes Intravenous Every 6 hours 06/02/21 1454 06/03/21 0117   06/02/21 0830  ceFAZolin (ANCEF) IVPB 2g/100 mL premix        2 g 200 mL/hr over 30 Minutes Intravenous On call to O.R. 06/02/21 0744 06/02/21 1228   06/02/21 0600  ceFAZolin (ANCEF) IVPB 2g/100 mL premix  Status:  Discontinued  2 g 200 mL/hr over 30 Minutes Intravenous On call to O.R. 06/01/21 1333 06/02/21 0748       Subjective:  Patient seen at the bedside this morning.  Comfortable, sleeping   Objective: Vitals:   06/30/21 0425 06/30/21 1522 06/30/21 2003 07/01/21 0602  BP: 136/66 133/82 138/65 (!) 163/78  Pulse: 83 70 80 93  Resp: 18 17 18 20   Temp: (!) 97.5 F (36.4 C) 97.6 F (36.4 C) 97.8 F (36.6 C)   TempSrc:   Oral   SpO2: 100% 100%  100%  Weight:      Height:        Intake/Output Summary (Last 24 hours) at  07/01/2021 0758 Last data filed at 07/01/2021 0600 Gross per 24 hour  Intake 720 ml  Output 1400 ml  Net -680 ml   Filed Weights   05/31/21 2309 06/02/21 0233  Weight: 46.8 kg 46.8 kg    Examination:  General exam: Comfortable, sleeping  Data Reviewed: I have personally reviewed following labs and imaging studies  CBC: No results for input(s): WBC, NEUTROABS, HGB, HCT, MCV, PLT in the last 168 hours. Basic Metabolic Panel: No results for input(s): NA, K, CL, CO2, GLUCOSE, BUN, CREATININE, CALCIUM, MG, PHOS in the last 168 hours.  GFR: Estimated Creatinine Clearance: 32.3 mL/min (by C-G formula based on SCr of 0.58 mg/dL). Liver Function Tests: No results for input(s): AST, ALT, ALKPHOS, BILITOT, PROT, ALBUMIN in the last 168 hours.  No results for input(s): LIPASE, AMYLASE in the last 168 hours. No results for input(s): AMMONIA in the last 168 hours. Coagulation Profile: No results for input(s): INR, PROTIME in the last 168 hours. Cardiac Enzymes: No results for input(s): CKTOTAL, CKMB, CKMBINDEX, TROPONINI in the last 168 hours. BNP (last 3 results) No results for input(s): PROBNP in the last 8760 hours. HbA1C: No results for input(s): HGBA1C in the last 72 hours. CBG: No results for input(s): GLUCAP in the last 168 hours. Lipid Profile: No results for input(s): CHOL, HDL, LDLCALC, TRIG, CHOLHDL, LDLDIRECT in the last 72 hours. Thyroid Function Tests: No results for input(s): TSH, T4TOTAL, FREET4, T3FREE, THYROIDAB in the last 72 hours. Anemia Panel: No results for input(s): VITAMINB12, FOLATE, FERRITIN, TIBC, IRON, RETICCTPCT in the last 72 hours. Sepsis Labs: No results for input(s): PROCALCITON, LATICACIDVEN in the last 168 hours.  No results found for this or any previous visit (from the past 240 hour(s)).       Radiology Studies: No results found.      Scheduled Meds:  aspirin  81 mg Oral BID   atorvastatin  10 mg Oral Daily   donepezil  5 mg Oral  QHS   levothyroxine  100 mcg Oral Once per day on Sun Fri Sat   levothyroxine  50 mcg Oral Once per day on Mon Tue Wed Thu   memantine  5 mg Oral Daily   Pirfenidone  801 mg Oral TID   Continuous Infusions:   LOS: 31 days    Time spent: 15 mins.More than 50% of that time was spent in counseling and/or coordination of care.      Sun, MD [facility] P9/26/2022, 7:58 AM

## 2021-07-02 ENCOUNTER — Inpatient Hospital Stay (HOSPITAL_COMMUNITY): Payer: Medicaid - Out of State

## 2021-07-02 NOTE — Progress Notes (Signed)
Physical Therapy Treatment Patient Details Name: Catherine Terrell MRN: [id number] DOB: 02-25-1933 Today's Date: 07/02/2021   History of Present Illness 87 yo female adm 05/31/21 after fall resulting in L IT hip fx. Pt s/p s/p ORIF IM Nail 06/02/21.  PMH: bil TKAs (done in [place]), dementia, interstitial lung disease.    PT Comments    Pt is making steady progress toward PT goals. See below for mobility details. Incr gait distance with decr assist needed overall and minimal fatigue. Worked on lateral scooting in sitting as well as side stepping.    Recommendations for follow up therapy are one component of a multi-disciplinary discharge planning process, led by the attending physician.  Recommendations may be updated based on patient status, additional functional criteria and insurance authorization.  Follow Up Recommendations  Other (comment);Supervision for mobility/OOB (back to [place] via plane--SNF vx HH in [place])     Equipment Recommendations  Rolling walker with 5" wheels    Recommendations for Other Services       Precautions / Restrictions Precautions Precautions: Fall Precaution Comments: minimal DOE today Restrictions Weight Bearing Restrictions: Yes LLE Weight Bearing: Partial weight bearing LLE Partial Weight Bearing Percentage or Pounds: 50%     Mobility  Bed Mobility Overal bed mobility: Needs Assistance Bed Mobility: Sit to Supine       Sit to supine: Min guard   General bed mobility comments: pt instructed in use of gait belt to self assist LLE, multi-modal cues for positioning in bed    Transfers Overall transfer level: Needs assistance Equipment used: Rolling walker (2 wheeled) Transfers: Sit to/from Stand Sit to Stand: Min guard        Lateral/Scoot Transfers: Min assist General transfer comment: positioned L hip in neutral prior to stand. min/guard to stand, visual cues for scooting forward and  hand placement -- pt demonstrating carryover from previous  sessions. worked on side stepping along EOB (simulate stepping over to window seat on plane), reviewed lateral scooting along EOB to simulate  scooting across seats  Ambulation/Gait Ambulation/Gait assistance: Min guard;Min assist Gait Distance (Feet): 30 Feet Assistive device: Rolling walker (2 wheeled) Gait Pattern/deviations: Step-to pattern;Trunk flexed;Narrow base of support;Staggering left Gait velocity: decr   General Gait Details: very light intermittent assist to steady. cues to keep feet inside RW and to avoid scissoring/excessive internal rotation L hip   Stairs             Wheelchair Mobility    Modified Rankin (Stroke Patients Only)       Balance             Standing balance-Leahy Scale: Poor Standing balance comment: reliant on UEs. able to static stand with RW and close supervision                            Cognition Arousal/Alertness: Awake/alert Behavior During Therapy: WFL for tasks assessed/performed Overall Cognitive Status: History of cognitive impairments - at baseline                                 General Comments: pt repsonds to visual cues. speaks some english at times      Exercises General Exercises - Lower Extremity Ankle Circles/Pumps: AROM;10 reps;Both Heel Slides: AROM;AAROM;Left;10 reps Hip ABduction/ADduction: AAROM;Left;10 reps Other Exercises Other Exercises: gentle stretching L inner thigh/adductors    General Comments  Pertinent Vitals/Pain Pain Assessment: Faces Faces Pain Scale: Hurts a little bit Pain Location: L hip with exercises Pain Descriptors / Indicators: Grimacing;Discomfort Pain Intervention(s): Limited activity within patient's tolerance;Monitored during session;Repositioned    Home Living                      Prior Function            PT Goals (current goals can now be found in the care plan section) Acute Rehab PT Goals Patient Stated Goal: family  would like pt to be able to fly home to [facility] Oct 4 PT Goal Formulation: With family Time For Goal Achievement: 07/09/21 Potential to Achieve Goals: Good Progress towards PT goals: Progressing toward goals    Frequency    Min 5X/week      PT Plan Current plan remains appropriate    Co-evaluation              AM-PAC PT "6 Clicks" Mobility   Outcome Measure  Help needed turning from your back to your side while in a flat bed without using bedrails?: A Little Help needed moving from lying on your back to sitting on the side of a flat bed without using bedrails?: A Little Help needed moving to and from a bed to a chair (including a wheelchair)?: A Little Help needed standing up from a chair using your arms (e.g., wheelchair or bedside chair)?: A Little Help needed to walk in hospital room?: A Little Help needed climbing 3-5 steps with a railing? : A Lot 6 Click Score: 17    End of Session Equipment Utilized During Treatment: Gait belt Activity Tolerance: Patient tolerated treatment well Patient left: in bed;with call bell/phone within reach;with bed alarm set Nurse Communication: Mobility status PT Visit Diagnosis: Other abnormalities of gait and mobility (R26.89);Muscle weakness (generalized) (M62.81)     Time: 4970-2637 PT Time Calculation (min) (ACUTE ONLY): 23 min  Charges:  $Gait Training: 8-22 mins $Therapeutic Exercise: 8-22 mins                     Delice Bison, PT  Acute Rehab Dept ([facility]) [phone] Pager [phone]  07/02/2021    [facility] 07/02/2021, 11:25 AM

## 2021-07-02 NOTE — Plan of Care (Signed)
  Problem: Activity: Goal: Ability to ambulate and perform ADLs will improve Outcome: Progressing   

## 2021-07-02 NOTE — Progress Notes (Signed)
PROGRESS NOTE    Catherine Terrell  EVO:[id number] DOB: 19-Dec-1932 DOA: 05/31/2021 PCP: System, Provider Not In   Chief Complain: Fall  Brief Narrative:  Patient is a 87 year old female with history of interstitial lung disease, hypothyroidism, hyperlipidemia, cognitive impairment who presented secondary to fall.  After fall,she developed left hip pain.  Imaging showed left hip fracture.  Orthopedics consulted and underwent IM nailing.  Hospital course remarkable for acute blood loss anemia requiring 2 L units of PRBC.  PT/OT recommended skilled nursing facility on discharge but placement is difficult due to lack of insurance.  Medically stable for discharge.  TOC following.  His son is planning to take her to [place] on early October.  Assessment & Plan:   Principal Problem:   Closed left hip fracture (HCC) Active Problems:   Interstitial lung disease (HCC)   Hypothyroidism   MCI (mild cognitive impairment)   Left hip fracture: Secondary to fall in the bathroom.After fall,she developed left hip pain.  Imaging showed left hip fracture.  Orthopedics consulted and underwent IM nailing.  On aspirin twice daily for DVT prophylaxis.  Acute blood loss anemia: Postoperatively.  Underwent 2 units  of blood transfusion.  Currently hemoglobin stable  History of interstitial lung disease/COPD pulmonary fibrosis/chronic respiratory failure with hypoxia: Currently stable on room air.  Follows with pulm as an outpatient.  On pirfenidone  Hypothyroidism: On Synthyroid  Cognitive impairment/dementia: Confused at baseline.  On Aricept.  Monitor mental status.  Delirium precautions  Acute urinary retention: Resolved.  Foley taken out  Hyperlipidemia :on Lipitor  Mildly elevated liver enzymes: Unclear etiology.  No abdominal pain.  Ultrasound of the right upper quadrant did not show any obvious pathology of the liver parenchyma  Hyponatremia: Mild, continue to monitor  Disposition: Patient is  originally from [place], lives with her youngest son.  She came to visit her elder son.  At her son's place, she fell in the bathroom near commode and broke her hip.  She has insurance from [place] with does not work in [place].  Her son is not able to take care of her here.  That is why she had prolonged hospitalization.  Her youngest son in [place] is traveling and is currently not at home.  Plan is to go to [place] in October  when her son will be back( his son will take her there by flight, son said he already has booked tickets).TOC was also looking for short-term rehab over here.         DVT prophylaxis:Aspirin Code Status: Full Family Communication: Son at bedside on 06/22/21 Status is: Inpatient  Remains inpatient appropriate because:Unsafe d/c plan  Dispo: The patient is from: Home              Anticipated d/c is to: SNF              Patient currently is medically stable for discharge   difficult to place patient Yes    Consultants: Orthopedics  Procedures:ORIF  Antimicrobials:  Anti-infectives (From admission, onward)    Start     Dose/Rate Route Frequency Ordered Stop   06/02/21 1830  ceFAZolin (ANCEF) IVPB 2g/100 mL premix        2 g 200 mL/hr over 30 Minutes Intravenous Every 6 hours 06/02/21 1454 06/03/21 0117   06/02/21 0830  ceFAZolin (ANCEF) IVPB 2g/100 mL premix        2 g 200 mL/hr over 30 Minutes Intravenous On call to O.R. 06/02/21 2993 06/02/21  1228   06/02/21 0600  ceFAZolin (ANCEF) IVPB 2g/100 mL premix  Status:  Discontinued        2 g 200 mL/hr over 30 Minutes Intravenous On call to O.R. 06/01/21 1333 06/02/21 0748       Subjective:  Patient seen at the bedside this morning.  Very comfortable, sitting in the chair today, eating banana  Objective: Vitals:   06/30/21 2003 07/01/21 0602 07/01/21 1312 07/02/21 0507  BP: 138/65 (!) 163/78 114/63 (!) 149/97  Pulse: 80 93 92 (!) 106  Resp: 18 20 18 16   Temp: 97.8 F (36.6 C)   (!) 97.5 F (36.4 C) (!) 97.5 F (36.4 C)  TempSrc: Oral  Oral Oral  SpO2:  100% 98%   Weight:      Height:        Intake/Output Summary (Last 24 hours) at 07/02/2021 0803 Last data filed at 07/02/2021 0600 Gross per 24 hour  Intake 0 ml  Output 850 ml  Net -850 ml   Filed Weights   05/31/21 2309 06/02/21 0233  Weight: 46.8 kg 46.8 kg    Examination:  General exam: Comfortable, not in any kind of distress.  Denies new complaints  Data Reviewed: I have personally reviewed following labs and imaging studies  CBC: No results for input(s): WBC, NEUTROABS, HGB, HCT, MCV, PLT in the last 168 hours. Basic Metabolic Panel: No results for input(s): NA, K, CL, CO2, GLUCOSE, BUN, CREATININE, CALCIUM, MG, PHOS in the last 168 hours.  GFR: Estimated Creatinine Clearance: 32.3 mL/min (by C-G formula based on SCr of 0.58 mg/dL). Liver Function Tests: No results for input(s): AST, ALT, ALKPHOS, BILITOT, PROT, ALBUMIN in the last 168 hours.  No results for input(s): LIPASE, AMYLASE in the last 168 hours. No results for input(s): AMMONIA in the last 168 hours. Coagulation Profile: No results for input(s): INR, PROTIME in the last 168 hours. Cardiac Enzymes: No results for input(s): CKTOTAL, CKMB, CKMBINDEX, TROPONINI in the last 168 hours. BNP (last 3 results) No results for input(s): PROBNP in the last 8760 hours. HbA1C: No results for input(s): HGBA1C in the last 72 hours. CBG: No results for input(s): GLUCAP in the last 168 hours. Lipid Profile: No results for input(s): CHOL, HDL, LDLCALC, TRIG, CHOLHDL, LDLDIRECT in the last 72 hours. Thyroid Function Tests: No results for input(s): TSH, T4TOTAL, FREET4, T3FREE, THYROIDAB in the last 72 hours. Anemia Panel: No results for input(s): VITAMINB12, FOLATE, FERRITIN, TIBC, IRON, RETICCTPCT in the last 72 hours. Sepsis Labs: No results for input(s): PROCALCITON, LATICACIDVEN in the last 168 hours.  No results found for this or any  previous visit (from the past 240 hour(s)).       Radiology Studies: No results found.      Scheduled Meds:  aspirin  81 mg Oral BID   atorvastatin  10 mg Oral Daily   COVID-19 mRNA bivalent vaccine (Pf[employer])  0.3 mL Intramuscular Once   donepezil  5 mg Oral QHS   levothyroxine  100 mcg Oral Once per day on Sun Fri Sat   levothyroxine  50 mcg Oral Once per day on Mon Tue Wed Thu   memantine  5 mg Oral Daily   Pirfenidone  801 mg Oral TID   Continuous Infusions:   LOS: 32 days    Time spent: 15 mins.More than 50% of that time was spent in counseling and/or coordination of care.      Thu, MD [facility] P9/27/2022, 8:03 AM

## 2021-07-02 NOTE — Progress Notes (Signed)
Occupational Therapy Treatment Patient Details Name: Catherine Terrell MRN: [id number] DOB: 1933-06-19 Today's Date: 07/02/2021   History of present illness 87 yo female adm 05/31/21 after fall resulting in L IT hip fx. Pt s/p s/p ORIF IM Nail 06/02/21.  PMH: bil TKAs (done in [place]), dementia, interstitial lung disease.   OT comments  Patient required increased encouragement to participate in all tasks on this date. Patient required increased assistance for sitting on edge of bed with posterior leaning noted today with patient requesting to get back to bed. Nursing made aware. Patient's discharge plan remains appropriate at this time. OT will continue to follow acutely.     Recommendations for follow up therapy are one component of a multi-disciplinary discharge planning process, led by the attending physician.  Recommendations may be updated based on patient status, additional functional criteria and insurance authorization.    Follow Up Recommendations  SNF    Equipment Recommendations  3 in 1 bedside commode    Recommendations for Other Services      Precautions / Restrictions Precautions Precautions: Fall Restrictions Weight Bearing Restrictions: Yes LLE Weight Bearing: Partial weight bearing LLE Partial Weight Bearing Percentage or Pounds: 50        ADL either performed or assessed with clinical judgement   ADL Overall ADL's : Needs assistance/impaired       Grooming Details (indicate cue type and reason): patient was able to wash face in bed with set up on this date. patient was noted to require max A for sitting on edge of bed with patient pushing posteriorly.               General ADL Comments: Patient was encouraged to participate in time out of bed with patient reporting needing lay back down. Patient upon second attempt sitting on edge of bed was mod A for sitting balance with one attempt to stand with strong posterior leaning with standing attempt. Patient  transitioned herself back into supine with max A for positioning midline in bed. nursing made aware of patients decline to participate.                 Cognition Arousal/Alertness: Awake/alert Behavior During Therapy: WFL for tasks assessed/performed Overall Cognitive Status: History of cognitive impairments - at baseline               General Comments: pt repsonds to visual cues. speaks some english at times                    Pertinent Vitals/ Pain       Pain Assessment: Faces Faces Pain Scale: Hurts little more Pain Location: L hip with bed mobility Pain Descriptors / Indicators: Grimacing;Discomfort Pain Intervention(s): Limited activity within patient's tolerance;Monitored during session   Frequency  Min 2X/week        Progress Toward Goals  OT Goals(current goals can now be found in the care plan section)     Acute Rehab OT Goals Patient Stated Goal: family would like pt to be able to fly home to [facility] Oct 4  Plan Discharge plan remains appropriate    AM-PAC OT "6 Clicks" Daily Activity     Outcome Measure   Help from another person eating meals?: None Help from another person taking care of personal grooming?: A Little Help from another person toileting, which includes using toliet, bedpan, or urinal?: A Lot Help from another person bathing (including washing, rinsing, drying)?: A Lot Help from another person to  put on and taking off regular upper body clothing?: A Little Help from another person to put on and taking off regular lower body clothing?: A Lot 6 Click Score: 16    End of Session Equipment Utilized During Treatment: Rolling walker  OT Visit Diagnosis: History of falling (Z91.81);Other abnormalities of gait and mobility (R26.89);Unsteadiness on feet (R26.81)   Activity Tolerance Patient limited by pain   Patient Left in bed;with call bell/phone within reach;with bed alarm set   Nurse Communication Other (comment) (patients self  limiting behaviors)        Time: 3825-0539 OT Time Calculation (min): 14 min  Charges: OT General Charges $OT Visit: 1 Visit OT Treatments $Self Care/Home Management : 8-22 mins  Sharyn Blitz OTR/L, MS Acute Rehabilitation Department Office# [phone] Pager# [phone]   Chalmers Guest Dondrell Loudermilk 07/02/2021, 8:57 AM

## 2021-07-03 NOTE — Progress Notes (Signed)
PROGRESS NOTE    Catherine Terrell  SAY:[id number] DOB: 10/31/1932 DOA: 05/31/2021 PCP: System, Provider Not In    No chief complaint on file.   Brief Narrative:  Patient is a 87 year old female with history of interstitial lung disease, hypothyroidism, hyperlipidemia, cognitive impairment who presented secondary to fall.  After fall,she developed left hip pain.  Imaging showed left hip fracture.  Orthopedics consulted and underwent IM nailing.  Hospital course remarkable for acute blood loss anemia requiring 2 L units of PRBC.  PT/OT recommended skilled nursing facility on discharge but placement is difficult due to lack of insurance.  Medically stable for discharge.  TOC following.  His son is planning to take her to [place] on early October.  Subjective:  She walked with PT with a walker to the bathroom and back, she appears tired at the end of the session She is hard of hearing, she is very appreciative about the service she received   Assessment & Plan:   Principal Problem:   Closed left hip fracture (HCC) Active Problems:   Interstitial lung disease (HCC)   Hypothyroidism   MCI (mild cognitive impairment)   Left hip fracture: Secondary to fall in the bathroom.After fall,she developed left hip pain.  Imaging showed left hip fracture.  Orthopedics consulted and underwent IM nailing.  On aspirin twice daily for DVT prophylaxis.   Acute blood loss anemia: Postoperatively.  Underwent 2 units  of blood transfusion.  Currently hemoglobin stable   History of interstitial lung disease/COPD pulmonary fibrosis/chronic respiratory failure with hypoxia: Currently stable on room air.  Follows with pulm as an outpatient.  On pirfenidone   Hypothyroidism: On Synthyroid   Cognitive impairment/dementia: Confused at baseline.  On Aricept.  Monitor mental status.  Delirium precautions   Acute urinary retention: Resolved.  Foley taken out   Hyperlipidemia :on Lipitor   Mildly elevated liver  enzymes: Unclear etiology.  No abdominal pain.  Ultrasound of the right upper quadrant did not show any obvious pathology of the liver parenchyma   Hyponatremia: Mild, continue to monitor   Disposition: Patient is originally from [place], lives with her youngest son.  She came to visit her elder son.  At her son's place, she fell in the bathroom near commode and broke her hip.  She has insurance from [place] with does not work in [place].  Her son is not able to take care of her here.  That is why she had prolonged hospitalization.  Her youngest son in [place] is traveling and is currently not at home.  Plan is to go to [place] in October  when her son will be back( his son will take her there by flight, son said he already has booked tickets).TOC was also looking for short-term rehab over here.   Nutritional Assessment:  The patient's BMI is: Body mass index is 23.13 kg/m.Marland Kitchen  DVT prophylaxis: SCDs Start: 06/02/21 1455 SCDs Start: 05/31/21 2218   Code Status:full Family Communication: none at bedside Disposition:   Status is: Inpatient  Dispo: The patient is from: home              Anticipated d/c is to: SNF              Anticipated d/c date is: difficult placement                 Consultants:  ortho  Procedures:  ORIF  Antimicrobials:   Anti-infectives (From admission, onward)    Start  Dose/Rate Route Frequency Ordered Stop   06/02/21 1830  ceFAZolin (ANCEF) IVPB 2g/100 mL premix        2 g 200 mL/hr over 30 Minutes Intravenous Every 6 hours 06/02/21 1454 06/03/21 0117   06/02/21 0830  ceFAZolin (ANCEF) IVPB 2g/100 mL premix        2 g 200 mL/hr over 30 Minutes Intravenous On call to O.R. 06/02/21 0744 06/02/21 1228   06/02/21 0600  ceFAZolin (ANCEF) IVPB 2g/100 mL premix  Status:  Discontinued        2 g 200 mL/hr over 30 Minutes Intravenous On call to O.R. 06/01/21 1333 06/02/21 0748           Objective: Vitals:   07/02/21 1338 07/02/21  2200 07/03/21 0505 07/03/21 1306  BP: 139/67 (!) 155/68 137/68 (!) 149/101  Pulse: 69 72 81 94  Resp: 18 18 18 18   Temp: (!) 97.5 F (36.4 C)   97.9 F (36.6 C)  TempSrc: Oral   Oral  SpO2: 98% 100% 100% 99%  Weight:      Height:        Intake/Output Summary (Last 24 hours) at 07/03/2021 1355 Last data filed at 07/03/2021 1000 Gross per 24 hour  Intake 780 ml  Output 800 ml  Net -20 ml   Filed Weights   05/31/21 2309 06/02/21 0233  Weight: 46.8 kg 46.8 kg    Examination:  General exam:  frail, NAD, hard of hearing  Respiratory system: Clear to auscultation. Respiratory effort normal. Cardiovascular system: S1 & S2 heard, RRR.  Gastrointestinal system: Abdomen is nondistended, soft and nontender.  Normal bowel sounds heard. Central nervous system: Alert and oriented.  Extremities: no edema Skin: No rashes, lesions or ulcers Psychiatry: calm, cooperative, no agitation    Data Reviewed: I have personally reviewed following labs and imaging studies  CBC: No results for input(s): WBC, NEUTROABS, HGB, HCT, MCV, PLT in the last 168 hours.  Basic Metabolic Panel: No results for input(s): NA, K, CL, CO2, GLUCOSE, BUN, CREATININE, CALCIUM, MG, PHOS in the last 168 hours.  GFR: Estimated Creatinine Clearance: 32.3 mL/min (by C-G formula based on SCr of 0.58 mg/dL).  Liver Function Tests: No results for input(s): AST, ALT, ALKPHOS, BILITOT, PROT, ALBUMIN in the last 168 hours.  CBG: No results for input(s): GLUCAP in the last 168 hours.   No results found for this or any previous visit (from the past 240 hour(s)).       Radiology Studies: DG HIP UNILAT WITH PELVIS 1V LEFT  Result Date: 07/02/2021 CLINICAL DATA:  ORIF left hip fracture EXAM: DG HIP (WITH OR WITHOUT PELVIS) 1V*L* COMPARISON:  05/31/2021 FINDINGS: Frontal view of the pelvis as well as a frogleg lateral view of the left hip are obtained. Intramedullary rod with proximal dynamic and distal interlocking  screw traverses the previously identified intertrochanteric left hip fracture. Mild callus formation since previous study. No new fractures. Alignment is anatomic. IMPRESSION: 1. ORIF intertrochanteric left hip fracture, with mild callus formation since prior study. Electronically Signed   By: 06/02/2021 M.D.   On: 07/02/2021 20:47        Scheduled Meds:  aspirin  81 mg Oral BID   atorvastatin  10 mg Oral Daily   donepezil  5 mg Oral QHS   levothyroxine  100 mcg Oral Once per day on Sun Fri Sat   levothyroxine  50 mcg Oral Once per day on Mon Tue Wed Thu   memantine  5  mg Oral Daily   Pirfenidone  801 mg Oral TID   Continuous Infusions:   LOS: 33 days   Time spent: Greater than 50% of this time was spent in counseling, explanation of diagnosis, planning of further management, and coordination of care.   Voice Recognition Reubin Milan dictation system was used to create this note, attempts have been made to correct errors. Please contact the author with questions and/or clarifications.   Albertine Grates, MD PhD FACP [facility]  Available via Epic secure chat 7am-7pm for nonurgent issues Please page for urgent issues To page the attending provider between 7A-7P or the covering provider during after hours 7P-7A, please log into the web site [email or web address] and access using universal [place] password for that web site. If you do not have the password, please call the hospital operator.    07/03/2021, 1:55 PM

## 2021-07-03 NOTE — Progress Notes (Signed)
Physical Therapy Treatment Patient Details Name: Catherine Terrell MRN: [id number] DOB: 1932/11/13 Today's Date: 07/03/2021   History of Present Illness 87 yo female adm 05/31/21 after fall resulting in L IT hip fx. Pt s/p s/p ORIF IM Nail 06/02/21. 07/02/21 f/u hip and pelvis xray shows IM nail with anatomical alignment, mild callous formation, no other fxs.  PMH: bil TKAs (done in [place]), dementia, interstitial lung disease.    PT Comments    Pt progressing with PT. Had pt amb to bathroom, able to perform her own pericare in sitting after visual cues for reaching to toilet paper dispenser.  Pt washed hands at sink with unilateral UE support and min/guard for safety.  Min/guard for sit<>stand transfers from toilet and bed. Continue PT in acute setting, hopefully will be able to involve pt son more/family ed  over the next few days.   Recommendations for follow up therapy are one component of a multi-disciplinary discharge planning process, led by the attending physician.  Recommendations may be updated based on patient status, additional functional criteria and insurance authorization.  Follow Up Recommendations  Other (comment);Supervision for mobility/OOB (return to [place] via plane)     Equipment Recommendations  Rolling walker with 5" wheels    Recommendations for Other Services       Precautions / Restrictions Precautions Precautions: Fall Precaution Comments: minimal DOE today Restrictions Weight Bearing Restrictions: Yes LLE Weight Bearing: Partial weight bearing LLE Partial Weight Bearing Percentage or Pounds: 50%     Mobility  Bed Mobility Overal bed mobility: Needs Assistance Bed Mobility: Supine to Sit;Sit to Supine     Supine to sit: Min assist;Min guard Sit to supine: Min guard   General bed mobility comments: multimodal cues needed to transition to edge of bed,  HOB flat. pt pulls on PTs hand to come to full sit/elevate trunk    Transfers Overall transfer level:  Needs assistance Equipment used: Rolling walker (2 wheeled) Transfers: Sit to/from Stand Sit to Stand: Min guard Stand pivot transfers: Min assist       General transfer comment: positioned L hip in neutral prior to stand. min/guard to stand, visual cues for scooting forward -- pt demonstrating carryover from previous sessions for hand placement.  Ambulation/Gait Ambulation/Gait assistance: Min guard;Min assist Gait Distance (Feet): 15 Feet (x2) Assistive device: Rolling walker (2 wheeled) Gait Pattern/deviations: Step-to pattern;Trunk flexed;Narrow base of support Gait velocity: decr   General Gait Details: very light intermittent assist to steady. intermittent assist to maneuver RW.  cues to keep feet inside RW and to avoid scissoring/excessive internal rotation L hip   Stairs             Wheelchair Mobility    Modified Rankin (Stroke Patients Only)       Balance Overall balance assessment: Needs assistance;History of Falls Sitting-balance support: Feet supported Sitting balance-Leahy Scale: Fair Sitting balance - Comments: able to complete her own peri-care in sitting after toileting   Standing balance support: Single extremity supported Standing balance-Leahy Scale: Poor Standing balance comment: pt able to wash hands with unilateral UE/forearm support                            Cognition Arousal/Alertness: Awake/alert Behavior During Therapy: WFL for tasks assessed/performed Overall Cognitive Status: History of cognitive impairments - at baseline  General Comments: pt repsonds to visual cues. speaks some english at times      Exercises General Exercises - Lower Extremity Ankle Circles/Pumps: AROM;10 reps;Both Hip ABduction/ADduction: AAROM;Left;10 reps Other Exercises Other Exercises: gentle stretching L inner thigh/adductors    General Comments        Pertinent Vitals/Pain Pain Assessment:  Faces Faces Pain Scale: Hurts a little bit Pain Location: L hip with movement Pain Descriptors / Indicators: Grimacing;Discomfort Pain Intervention(s): Limited activity within patient's tolerance;Monitored during session    Home Living                      Prior Function            PT Goals (current goals can now be found in the care plan section) Acute Rehab PT Goals Patient Stated Goal: family would like pt to be able to fly home to [place] Oct 4 PT Goal Formulation: With family Time For Goal Achievement: 07/09/21 Potential to Achieve Goals: Good Progress towards PT goals: Progressing toward goals    Frequency    Min 5X/week      PT Plan Current plan remains appropriate    Co-evaluation              AM-PAC PT "6 Clicks" Mobility   Outcome Measure  Help needed turning from your back to your side while in a flat bed without using bedrails?: A Little Help needed moving from lying on your back to sitting on the side of a flat bed without using bedrails?: A Little Help needed moving to and from a bed to a chair (including a wheelchair)?: A Little Help needed standing up from a chair using your arms (e.g., wheelchair or bedside chair)?: A Little Help needed to walk in hospital room?: A Little Help needed climbing 3-5 steps with a railing? : A Lot 6 Click Score: 17    End of Session Equipment Utilized During Treatment: Gait belt Activity Tolerance: Patient tolerated treatment well Patient left: in bed;with call bell/phone within reach;with bed alarm set Nurse Communication: Mobility status PT Visit Diagnosis: Other abnormalities of gait and mobility (R26.89);Muscle weakness (generalized) (M62.81)     Time: 6387-5643 PT Time Calculation (min) (ACUTE ONLY): 17 min  Charges:  $Gait Training: 8-22 mins                     Delice Bison, PT  Acute Rehab Dept ([facility]) [phone] Pager [phone]  07/03/2021    [facility] 07/03/2021, 2:11 PM

## 2021-07-03 NOTE — Progress Notes (Signed)
Occupational Therapy Treatment Patient Details Name: Catherine Terrell MRN: [id number] DOB: [birth year] Today's Date: 07/03/2021   History of present illness 87 yo female adm 05/31/21 after fall resulting in L IT hip fx. Pt s/p s/p ORIF IM Nail 06/02/21.  PMH: bil TKAs (done in [place]), dementia, interstitial lung disease.   OT comments  Patient was able to participate more in session today with noted fixation on hearing aids at start of session. Patient was min guard for supine to sit on edge of bed and min A to transfer with RW with multimodal cues provided to maintain WB restrictions. Patient's discharge plan remains appropriate at this time. OT will continue to follow acutely.     Recommendations for follow up therapy are one component of a multi-disciplinary discharge planning process, led by the attending physician.  Recommendations may be updated based on patient status, additional functional criteria and insurance authorization.    Follow Up Recommendations  SNF    Equipment Recommendations  3 in 1 bedside commode    Recommendations for Other Services      Precautions / Restrictions Precautions Precautions: Fall Precaution Comments: minimal DOE today Restrictions Weight Bearing Restrictions: Yes LLE Weight Bearing: Partial weight bearing LLE Partial Weight Bearing Percentage or Pounds: 50       Mobility Bed Mobility Overal bed mobility: Needs Assistance Bed Mobility: Sit to Supine     Supine to sit: Min assist;HOB elevated     General bed mobility comments: multimodal cues needed to transition to edge of bed    Transfers Overall transfer level: Needs assistance Equipment used: Rolling walker (2 wheeled) Transfers: Sit to/from Stand Sit to Stand: Min guard Stand pivot transfers: Min assist            Balance                                           ADL either performed or assessed with clinical judgement   ADL Overall ADL's : Needs  assistance/impaired     Grooming: Set up;Sitting Grooming Details (indicate cue type and reason): with increased encouragement to participate in washing face while on edge of bed. patient fixated by hearing aids.                             Functional mobility during ADLs: Minimal assistance;Rolling walker General ADL Comments: patient was agreeable to getting out of bed. patient was min guard for supine to sit on edge of bed. patient required max multimodal cues to matinain WB precautions during standing. patient was able to comb hair sitting in recliner chair in room. patient declined to participate in washing up or toileting on this date.     Vision       Perception     Praxis      Cognition Arousal/Alertness: Awake/alert Behavior During Therapy: WFL for tasks assessed/performed Overall Cognitive Status: History of cognitive impairments - at baseline                                 General Comments: pt repsonds to visual cues. speaks some english at times        Exercises     Shoulder Instructions       General Comments  Pertinent Vitals/ Pain       Pain Assessment: Faces Faces Pain Scale: Hurts a little bit Pain Location: L hip with movement Pain Descriptors / Indicators: Grimacing;Discomfort Pain Intervention(s): Monitored during session;Limited activity within patient's tolerance  Home Living                                          Prior Functioning/Environment              Frequency  Min 2X/week        Progress Toward Goals  OT Goals(current goals can now be found in the care plan section)     Acute Rehab OT Goals Patient Stated Goal: family would like pt to be able to fly home to [facility] Oct 4  Plan Discharge plan remains appropriate    Co-evaluation                 AM-PAC OT "6 Clicks" Daily Activity     Outcome Measure   Help from another person eating meals?: None Help from  another person taking care of personal grooming?: A Little Help from another person toileting, which includes using toliet, bedpan, or urinal?: A Lot Help from another person bathing (including washing, rinsing, drying)?: A Lot Help from another person to put on and taking off regular upper body clothing?: A Little Help from another person to put on and taking off regular lower body clothing?: A Lot 6 Click Score: 16    End of Session Equipment Utilized During Treatment: Rolling walker  OT Visit Diagnosis: History of falling (Z91.81);Other abnormalities of gait and mobility (R26.89);Unsteadiness on feet (R26.81)   Activity Tolerance Patient tolerated treatment well   Patient Left in chair;with call bell/phone within reach;with chair alarm set   Nurse Communication Other (comment)        Time: 2831-5176 OT Time Calculation (min): 23 min  Charges: OT General Charges $OT Visit: 1 Visit OT Treatments $Self Care/Home Management : 23-37 mins  Catherine Terrell OTR/L, MS Acute Rehabilitation Department Office# [phone] Pager# [phone]   Catherine Terrell Catherine Terrell 07/03/2021, 12:19 PM

## 2021-07-03 NOTE — Plan of Care (Signed)
Language and cognitive barrier

## 2021-07-04 MED ORDER — SODIUM CHLORIDE 0.9 % IV BOLUS
500.0000 mL | Freq: Once | INTRAVENOUS | Status: AC
  Administered 2021-07-04: 500 mL via INTRAVENOUS

## 2021-07-04 NOTE — Progress Notes (Signed)
Physical Therapy Treatment Patient Details Name: Catherine Terrell MRN: [id number] DOB: 07/01/1933 Today's Date: 07/04/2021   History of Present Illness 87 yo female adm 05/31/21 after fall resulting in L IT hip fx. Pt s/p s/p ORIF IM Nail 06/02/21. 07/02/21 f/u hip and pelvis xray shows IM nail with anatomical alignment, mild callous formation, no other fxs.  PMH: bil TKAs (done in [place]), dementia, interstitial lung disease.    PT Comments    Patient more confused and limited today by fatigue and DOE. Encouragement required to sit up to EOB and pt requesting bathroom after sitting. Pt VERY unsafe with RW management and Mod assist required to maintain balance and manage walker. Pt attempting to sit on BSC before safely in front of it and HR elevated to 130's after sitting on commode with pt tachyphemic. Pt required bed to be moved closer for transfer back to bed and RN completing EKG per orders of MD. EOS discussed discharge plans with son. Concern expressed as pt's flight is in evening and she seems to be more fatigued then. Pt's son also uncertain of plan in [place] as his brother informed him there is no SNF for her to rehab at once she lands. Acute PT will continue to progress pt as able.     Recommendations for follow up therapy are one component of a multi-disciplinary discharge planning process, led by the attending physician.  Recommendations may be updated based on patient status, additional functional criteria and insurance authorization.  Follow Up Recommendations  Other (comment);Supervision for mobility/OOB (return to [place] via plane (son reports no longer has SNF in place for return to [place]))     Equipment Recommendations  Rolling walker with 5" wheels    Recommendations for Other Services       Precautions / Restrictions Precautions Precautions: Fall Precaution Comments: DOE Restrictions Weight Bearing Restrictions: Yes LLE Weight Bearing: Partial weight bearing LLE Partial  Weight Bearing Percentage or Pounds: 50%     Mobility  Bed Mobility Overal bed mobility: Needs Assistance Bed Mobility: Supine to Sit;Sit to Supine     Supine to sit: Min assist;HOB elevated Sit to supine: Mod assist   General bed mobility comments: multimodal cues needed to transition to edge of bed,  pt pulling on PTs arm for support to scoot/pivot to EOB. Assist needed to fully bring LE's off and tactile cues for pt to initiate. Pt required    Transfers Overall transfer level: Needs assistance Equipment used: Rolling walker (2 wheeled) Transfers: Sit to/from Stand Sit to Stand: Min assist         General transfer comment: positioned L hip in neutral prior to stand. min assist to stand, visual cues for scooting forward -- pt able to initaite power up but assist needed to steady in standing. 3x from EOB, 3x from [facility].  Ambulation/Gait Ambulation/Gait assistance: Min assist;Mod assist Gait Distance (Feet): 14 Feet (8, 6) Assistive device: Rolling walker (2 wheeled) Gait Pattern/deviations: Step-to pattern;Trunk flexed;Narrow base of support Gait velocity: decr   General Gait Details: Pt requried increased Mod assist today and had very poor safety awareness with RW. pt required assist to keep walker in safe proximity and was pushing walker away and attempting sit prior to safe position to [facility].   Stairs             Wheelchair Mobility    Modified Rankin (Stroke Patients Only)       Balance Overall balance assessment: Needs assistance;History of Falls Sitting-balance support:  Feet supported Sitting balance-Leahy Scale: Fair Sitting balance - Comments: pt with posterior lean today and "flopping" body back at EOB and in BSC due to fatigue   Standing balance support: Bilateral upper extremity supported Standing balance-Leahy Scale: Poor                              Cognition Arousal/Alertness: Awake/alert Behavior During Therapy: WFL for tasks  assessed/performed Overall Cognitive Status: History of cognitive impairments - at baseline                                 General Comments: pt responds to visual cues. speaks some english at times      Exercises      General Comments        Pertinent Vitals/Pain Pain Assessment: Faces Faces Pain Scale: Hurts little more Pain Location: L hip with movement (also SOB and CP) Pain Descriptors / Indicators: Grimacing;Discomfort;Moaning Pain Intervention(s): Limited activity within patient's tolerance (MD arrived in session, ordered EKG)    Home Living                      Prior Function            PT Goals (current goals can now be found in the care plan section) Acute Rehab PT Goals Patient Stated Goal: family would like pt to be able to fly home to [facility] Oct 4 PT Goal Formulation: With family Time For Goal Achievement: 07/09/21 Potential to Achieve Goals: Good Progress towards PT goals: Progressing toward goals (slow)    Frequency    Min 5X/week      PT Plan Current plan remains appropriate    Co-evaluation              AM-PAC PT "6 Clicks" Mobility   Outcome Measure  Help needed turning from your back to your side while in a flat bed without using bedrails?: A Little Help needed moving from lying on your back to sitting on the side of a flat bed without using bedrails?: A Little Help needed moving to and from a bed to a chair (including a wheelchair)?: A Little Help needed standing up from a chair using your arms (e.g., wheelchair or bedside chair)?: A Lot Help needed to walk in hospital room?: A Lot Help needed climbing 3-5 steps with a railing? : A Lot 6 Click Score: 15    End of Session Equipment Utilized During Treatment: Gait belt Activity Tolerance: Patient tolerated treatment well Patient left: in bed;with call bell/phone within reach;with bed alarm set Nurse Communication: Mobility status PT Visit Diagnosis: Other  abnormalities of gait and mobility (R26.89);Muscle weakness (generalized) (M62.81)     Time: 1027-2536 PT Time Calculation (min) (ACUTE ONLY): 32 min  Charges:  $Gait Training: 8-22 mins $Therapeutic Activity: 8-22 mins                     Wynn Maudlin, DPT Acute Rehabilitation Services Office [phone] Pager [phone]    Anitra Lauth 07/04/2021, 7:32 PM

## 2021-07-04 NOTE — Progress Notes (Addendum)
PROGRESS NOTE    Catherine Terrell  ZJQ:[id number] DOB: 1933-08-28 DOA: 05/31/2021 PCP: System, Provider Not In    No chief complaint on file.   Brief Narrative:  Patient is a 87 year old female with history of interstitial lung disease, hypothyroidism, hyperlipidemia, cognitive impairment who presented secondary to fall.  After fall,she developed left hip pain.  Imaging showed left hip fracture.  Orthopedics consulted and underwent IM nailing.  Hospital course remarkable for acute blood loss anemia requiring 2 L units of PRBC.  PT/OT recommended skilled nursing facility on discharge but placement is difficult due to lack of insurance.  Medically stable for discharge.  TOC following.  His son is planning to take her to [place] on early October.  Subjective:  She walked with PT , reports feeling tired, had tachycardia, per RN patient has poor oral intake, has had tachycardia with activity for the last two weeks She is hard of hearing, she is very appreciative about the service she received  Son at bedside   Assessment & Plan:   Principal Problem:   Closed left hip fracture (HCC) Active Problems:   Interstitial lung disease (HCC)   Hypothyroidism   MCI (mild cognitive impairment)   Left hip fracture: Secondary to fall in the bathroom.After fall,she developed left hip pain.  Imaging showed left hip fracture.  Orthopedics consulted and underwent IM nailing.  On aspirin twice daily for DVT prophylaxis. Last ortho note by Dr Ranell Patrick on 9/5   Acute blood loss anemia: Postoperatively.  Underwent 2 units  of blood transfusion.  Currently hemoglobin stable   History of interstitial lung disease/COPD pulmonary fibrosis/chronic respiratory failure with hypoxia: Currently stable on room air.  Follows with pulm as an outpatient.  On pirfenidone   Hypothyroidism: On Synthyroid   Cognitive impairment/dementia: Confused at baseline.  On Aricept.  Monitor mental status.  Delirium precautions    Acute urinary retention: Resolved.  Foley taken out   Hyperlipidemia :on Lipitor   Mildly elevated liver enzymes: Unclear etiology.  No abdominal pain.  Ultrasound of the right upper quadrant did not show any obvious pathology of the liver parenchyma   Hyponatremia: Mild, continue to monitor Appear dehydrated, will give hydration, encourage oral intake   Disposition: Patient is originally from [place], lives with her youngest son.  She came to visit her elder son.  At her son's place, she fell in the bathroom near commode and broke her hip.  She has insurance from [place] with does not work in [place].  Her son is not able to take care of her here.  That is why she had prolonged hospitalization.  Her youngest son in [place] is traveling and is currently not at home.  Plan is to go to [place] in October  when her son will be back( his son will take her there by flight, son said he already has booked tickets).TOC was also looking for short-term rehab over here.   Nutritional Assessment:  The patient's BMI is: Body mass index is 23.13 kg/m.Marland Kitchen  DVT prophylaxis: SCDs Start: 06/02/21 1455 SCDs Start: 05/31/21 2218   Code Status:full Family Communication: son at bedside on 9/29 Disposition:   Status is: Inpatient  Dispo: The patient is from: home              Anticipated d/c is to: SNF              Anticipated d/c date is: difficult placement  Consultants:  ortho  Procedures:  ORIF  Antimicrobials:   Anti-infectives (From admission, onward)    Start     Dose/Rate Route Frequency Ordered Stop   06/02/21 1830  ceFAZolin (ANCEF) IVPB 2g/100 mL premix        2 g 200 mL/hr over 30 Minutes Intravenous Every 6 hours 06/02/21 1454 06/03/21 0117   06/02/21 0830  ceFAZolin (ANCEF) IVPB 2g/100 mL premix        2 g 200 mL/hr over 30 Minutes Intravenous On call to O.R. 06/02/21 0744 06/02/21 1228   06/02/21 0600  ceFAZolin (ANCEF) IVPB 2g/100 mL premix   Status:  Discontinued        2 g 200 mL/hr over 30 Minutes Intravenous On call to O.R. 06/01/21 1333 06/02/21 0748           Objective: Vitals:   07/03/21 1306 07/03/21 2159 07/04/21 0432 07/04/21 1403  BP: (!) 149/101 134/78 131/74 120/77  Pulse: 94 80 74 77  Resp: 18 18 18 18   Temp: 97.9 F (36.6 C) 98.8 F (37.1 C) (!) 97.4 F (36.3 C) 97.8 F (36.6 C)  TempSrc: Oral Oral Oral   SpO2: 99% 97% 99% 98%  Weight:      Height:        Intake/Output Summary (Last 24 hours) at 07/04/2021 1910 Last data filed at 07/04/2021 1716 Gross per 24 hour  Intake 480 ml  Output 700 ml  Net -220 ml   Filed Weights   05/31/21 2309 06/02/21 0233  Weight: 46.8 kg 46.8 kg    Examination:  General exam:  frail, NAD, hard of hearing  Respiratory system: Clear to auscultation. Respiratory effort normal. Cardiovascular system: S1 & S2 heard, RRR.  Gastrointestinal system: Abdomen is nondistended, soft and nontender.  Normal bowel sounds heard. Central nervous system: Alert and oriented.  Extremities: no edema Skin: No rashes, lesions or ulcers Psychiatry: calm, cooperative, no agitation    Data Reviewed: I have personally reviewed following labs and imaging studies  CBC: No results for input(s): WBC, NEUTROABS, HGB, HCT, MCV, PLT in the last 168 hours.  Basic Metabolic Panel: No results for input(s): NA, K, CL, CO2, GLUCOSE, BUN, CREATININE, CALCIUM, MG, PHOS in the last 168 hours.  GFR: Estimated Creatinine Clearance: 32.3 mL/min (by C-G formula based on SCr of 0.58 mg/dL).  Liver Function Tests: No results for input(s): AST, ALT, ALKPHOS, BILITOT, PROT, ALBUMIN in the last 168 hours.  CBG: No results for input(s): GLUCAP in the last 168 hours.   No results found for this or any previous visit (from the past 240 hour(s)).       Radiology Studies: DG HIP UNILAT WITH PELVIS 1V LEFT  Result Date: 07/02/2021 CLINICAL DATA:  ORIF left hip fracture EXAM: DG HIP (WITH  OR WITHOUT PELVIS) 1V*L* COMPARISON:  05/31/2021 FINDINGS: Frontal view of the pelvis as well as a frogleg lateral view of the left hip are obtained. Intramedullary rod with proximal dynamic and distal interlocking screw traverses the previously identified intertrochanteric left hip fracture. Mild callus formation since previous study. No new fractures. Alignment is anatomic. IMPRESSION: 1. ORIF intertrochanteric left hip fracture, with mild callus formation since prior study. Electronically Signed   By: 06/02/2021 M.D.   On: 07/02/2021 20:47        Scheduled Meds:  aspirin  81 mg Oral BID   atorvastatin  10 mg Oral Daily   donepezil  5 mg Oral QHS   levothyroxine  100 mcg  Oral Once per day on Sun Fri Sat   levothyroxine  50 mcg Oral Once per day on Mon Tue Wed Thu   memantine  5 mg Oral Daily   Pirfenidone  801 mg Oral TID   Continuous Infusions:  sodium chloride       LOS: 34 days   Time spent: Greater than 50% of this time was spent in counseling, explanation of diagnosis, planning of further management, and coordination of care.   Voice Recognition Reubin Milan dictation system was used to create this note, attempts have been made to correct errors. Please contact the author with questions and/or clarifications.   Albertine Grates, MD PhD FACP [facility]  Available via Epic secure chat 7am-7pm for nonurgent issues Please page for urgent issues To page the attending provider between 7A-7P or the covering provider during after hours 7P-7A, please log into the web site [email or web address] and access using universal [place] password for that web site. If you do not have the password, please call the hospital operator.    07/04/2021, 7:10 PM

## 2021-07-05 ENCOUNTER — Institutional Professional Consult (permissible substitution): Payer: Medicare Other | Admitting: Internal Medicine

## 2021-07-05 LAB — COMPREHENSIVE METABOLIC PANEL
ALT: 35 U/L (ref 0–44)
AST: 29 U/L (ref 15–41)
Albumin: 2.6 g/dL — ABNORMAL LOW (ref 3.5–5.0)
Alkaline Phosphatase: 117 U/L (ref 38–126)
Anion gap: 7 (ref 5–15)
BUN: 17 mg/dL (ref 8–23)
CO2: 23 mmol/L (ref 22–32)
Calcium: 9.1 mg/dL (ref 8.9–10.3)
Chloride: 105 mmol/L (ref 98–111)
Creatinine, Ser: 0.74 mg/dL (ref 0.44–1.00)
GFR, Estimated: >60 mL/min (ref >60)
Glucose, Bld: 91 mg/dL (ref 70–99)
Potassium: 4.3 mmol/L (ref 3.5–5.1)
Sodium: 135 mmol/L (ref 135–145)
Total Bilirubin: 0.4 mg/dL (ref 0.3–1.2)
Total Protein: 5.9 g/dL — ABNORMAL LOW (ref 6.5–8.1)

## 2021-07-05 LAB — CBC
HCT: 31.2 % — ABNORMAL LOW (ref 36.0–46.0)
Hemoglobin: 10 g/dL — ABNORMAL LOW (ref 12.0–15.0)
MCH: 32.1 pg (ref 26.0–34.0)
MCHC: 32.1 g/dL (ref 30.0–36.0)
MCV: 100 fL (ref 80.0–100.0)
Platelets: 359 10*3/uL (ref 150–400)
RBC: 3.12 MIL/uL — ABNORMAL LOW (ref 3.87–5.11)
RDW: 14.3 % (ref 11.5–15.5)
WBC: 9 10*3/uL (ref 4.0–10.5)
nRBC: 0 % (ref 0.0–0.2)

## 2021-07-05 LAB — MAGNESIUM: Magnesium: 1.9 mg/dL (ref 1.7–2.4)

## 2021-07-05 NOTE — Progress Notes (Signed)
PROGRESS NOTE    Catherine Terrell  HQP:[id number] DOB: 1933-01-25 DOA: 05/31/2021 PCP: System, Provider Not In    No chief complaint on file.   Brief Narrative:  Patient is a 87 year old female with history of interstitial lung disease, hypothyroidism, hyperlipidemia, cognitive impairment who presented secondary to fall.  After fall,she developed left hip pain.  Imaging showed left hip fracture.  Orthopedics consulted and underwent IM nailing.  Hospital course remarkable for acute blood loss anemia requiring 2 L units of PRBC.  PT/OT recommended skilled nursing facility on discharge but placement is difficult due to lack of insurance.  Medically stable for discharge.  TOC following.  His son is planning to take her to [place] on early October.  Subjective:  No acute over night event She is calm and no acute changes   She is hard of hearing, she is very appreciative about the service she received   Medically stable to discharge  Assessment & Plan:   Principal Problem:   Closed left hip fracture (HCC) Active Problems:   Interstitial lung disease (HCC)   Hypothyroidism   MCI (mild cognitive impairment)   Left hip fracture: Secondary to fall in the bathroom.After fall,she developed left hip pain.  Imaging showed left hip fracture.  Orthopedics consulted and underwent IM nailing.  On aspirin twice daily for DVT prophylaxis. Last ortho note by Dr Ranell Patrick on 9/5   Acute blood loss anemia: Postoperatively.  Underwent 2 units  of blood transfusion.  Currently hemoglobin stable   History of interstitial lung disease/COPD pulmonary fibrosis/chronic respiratory failure with hypoxia: Currently stable on room air.  Follows with pulm as an outpatient.  On pirfenidone   Hypothyroidism: On Synthyroid   Cognitive impairment/dementia: Confused at baseline.  On Aricept.  Monitor mental status.  Delirium precautions   Acute urinary retention: Resolved.  Foley taken out   Hyperlipidemia :on  Lipitor   Mildly elevated liver enzymes: Unclear etiology.  No abdominal pain.  Ultrasound of the right upper quadrant did not show any obvious pathology of the liver parenchyma   Hyponatremia: Mild, continue to monitor Appear dehydrated, will give hydration, encourage oral intake   Disposition: Patient is originally from [place], lives with her youngest son.  She came to visit her elder son.  At her son's place, she fell in the bathroom near commode and broke her hip.  She has insurance from [place] with does not work in [place].  Her son is not able to take care of her here.  That is why she had prolonged hospitalization.  Her youngest son in [place] is traveling and is currently not at home.  Plan is to go to [place] in October  when her son will be back( his son will take her there by flight, son said he already has booked tickets).TOC was also looking for short-term rehab over here.   Nutritional Assessment:  The patient's BMI is: Body mass index is 23.13 kg/m.Marland Kitchen  DVT prophylaxis: SCDs Start: 06/02/21 1455 SCDs Start: 05/31/21 2218   Code Status:full Family Communication: son at bedside on 9/29 Disposition:   Status is: Inpatient  Dispo: The patient is from: home              Anticipated d/c is to: SNF, son will bring patient back to [place]               Anticipated d/c date is: difficult placement  Consultants:  ortho  Procedures:  ORIF  Antimicrobials:   Anti-infectives (From admission, onward)    Start     Dose/Rate Route Frequency Ordered Stop   06/02/21 1830  ceFAZolin (ANCEF) IVPB 2g/100 mL premix        2 g 200 mL/hr over 30 Minutes Intravenous Every 6 hours 06/02/21 1454 06/03/21 0117   06/02/21 0830  ceFAZolin (ANCEF) IVPB 2g/100 mL premix        2 g 200 mL/hr over 30 Minutes Intravenous On call to O.R. 06/02/21 0744 06/02/21 1228   06/02/21 0600  ceFAZolin (ANCEF) IVPB 2g/100 mL premix  Status:  Discontinued        2  g 200 mL/hr over 30 Minutes Intravenous On call to O.R. 06/01/21 1333 06/02/21 0748           Objective: Vitals:   07/04/21 1403 07/04/21 2131 07/05/21 0538 07/05/21 1330  BP: 120/77 136/71 137/75 (!) 146/74  Pulse: 77 81 71 86  Resp: 18 16 16 20   Temp: 97.8 F (36.6 C) 97.8 F (36.6 C)  97.7 F (36.5 C)  TempSrc:  Oral  Oral  SpO2: 98% 98% 98% 100%  Weight:      Height:        Intake/Output Summary (Last 24 hours) at 07/05/2021 1812 Last data filed at 07/05/2021 1600 Gross per 24 hour  Intake 300 ml  Output 450 ml  Net -150 ml   Filed Weights   05/31/21 2309 06/02/21 0233  Weight: 46.8 kg 46.8 kg    Examination:  General exam:  frail, NAD, hard of hearing  Respiratory system: Clear to auscultation. Respiratory effort normal. Cardiovascular system: S1 & S2 heard, RRR.  Gastrointestinal system: Abdomen is nondistended, soft and nontender.  Normal bowel sounds heard. Central nervous system: Alert and oriented.  Extremities: no edema Skin: No rashes, lesions or ulcers Psychiatry: calm, cooperative, no agitation    Data Reviewed: I have personally reviewed following labs and imaging studies  CBC: Recent Labs  Lab 07/05/21 0438  WBC 9.0  HGB 10.0*  HCT 31.2*  MCV 100.0  PLT 359    Basic Metabolic Panel: Recent Labs  Lab 07/05/21 0438  NA 135  K 4.3  CL 105  CO2 23  GLUCOSE 91  BUN 17  CREATININE 0.74  CALCIUM 9.1  MG 1.9    GFR: Estimated Creatinine Clearance: 32.3 mL/min (by C-G formula based on SCr of 0.74 mg/dL).  Liver Function Tests: Recent Labs  Lab 07/05/21 0438  AST 29  ALT 35  ALKPHOS 117  BILITOT 0.4  PROT 5.9*  ALBUMIN 2.6*    CBG: No results for input(s): GLUCAP in the last 168 hours.   No results found for this or any previous visit (from the past 240 hour(s)).       Radiology Studies: No results found.      Scheduled Meds:  aspirin  81 mg Oral BID   atorvastatin  10 mg Oral Daily   donepezil  5 mg  Oral QHS   levothyroxine  100 mcg Oral Once per day on Sun Fri Sat   levothyroxine  50 mcg Oral Once per day on Mon Tue Wed Thu   memantine  5 mg Oral Daily   Pirfenidone  801 mg Oral TID   Continuous Infusions:     LOS: 35 days   Time spent: Fri Greater than 50% of this time was spent in counseling, explanation of diagnosis, planning of further management, and  coordination of care.   Voice Recognition Reubin Milan dictation system was used to create this note, attempts have been made to correct errors. Please contact the author with questions and/or clarifications.   Albertine Grates, MD PhD FACP [facility]  Available via Epic secure chat 7am-7pm for nonurgent issues Please page for urgent issues To page the attending provider between 7A-7P or the covering provider during after hours 7P-7A, please log into the web site [email or web address] and access using universal [place] password for that web site. If you do not have the password, please call the hospital operator.    07/05/2021, 6:12 PM

## 2021-07-05 NOTE — Progress Notes (Signed)
Physical Therapy Treatment Patient Details Name: Catherine Terrell MRN: [id number] DOB: 03-12-1933 Today's Date: 07/05/2021   History of Present Illness 87 yo female adm 05/31/21 after fall resulting in L IT hip fx. Pt s/p s/p ORIF IM Nail 06/02/21. 07/02/21 f/u hip and pelvis xray shows IM nail with anatomical alignment, mild callous formation, no other fxs.  PMH: bil TKAs (done in [place]), dementia, interstitial lung disease.    PT Comments    Patient continued to require min-mod assist today and demonstrated reduced safety awareness and attempted to sit multiple times prior to safe proximity to surface. Patient easily fatigued this date but did ambulate slightly increased distance than yesterday. Attempted to complete lateral scoot/pivot in recliner for airplane chair training however pt too fatigued and no attempting to assist with or initiate any mobility but rather relying on therapist to provide full assist. Repositioned at EOS in recliner with alarm on. Acute PT will continue to progress pt as able.    Recommendations for follow up therapy are one component of a multi-disciplinary discharge planning process, led by the attending physician.  Recommendations may be updated based on patient status, additional functional criteria and insurance authorization.  Follow Up Recommendations  Other (comment);Supervision for mobility/OOB (return to [place] via plane (son reports no longer has SNF in place for return to [place]))     Equipment Recommendations  Rolling walker with 5" wheels    Recommendations for Other Services       Precautions / Restrictions Precautions Precautions: Fall Precaution Comments: DOE Restrictions Weight Bearing Restrictions: Yes LLE Weight Bearing: Partial weight bearing LLE Partial Weight Bearing Percentage or Pounds: 50%     Mobility  Bed Mobility Overal bed mobility: Needs Assistance Bed Mobility: Supine to Sit     Supine to sit: Min assist;HOB elevated      General bed mobility comments: multimodal cues needed to initiate bringing LE's off EOB. pt also with significant posterior lean and mod assist with reapeated cues needed to sit upright at EOB.    Transfers Overall transfer level: Needs assistance Equipment used: Rolling walker (2 wheeled) Transfers: Sit to/from Stand Sit to Stand: Min assist Stand pivot transfers: Mod assist       General transfer comment: manual assist to position Lt hip in neutral prior to stand. min +2 assist for rise from EOB and recliner with multimodal cues for pt to initiate. Mod assist to manage walker with turn to move to recliner and pt unsafe attempting to sit in recliner before in safe proximity for this. Pt also attempted early sit on [facility] and Mod assist needed for safety.  Ambulation/Gait Ambulation/Gait assistance: Mod assist;+2 safety/equipment Gait Distance (Feet): 24 Feet (2, 11, 11) Assistive device: Rolling walker (2 wheeled) Gait Pattern/deviations: Step-to pattern;Trunk flexed;Narrow base of support Gait velocity: decr   General Gait Details: Pt requried increased Mod assist today and had poor safety awareness with RW. pt required assist to keep walker in safe proximity and was pushing walker away and attempting sit prior to safe position to [facility] and recliner. pt easiliy fatigued.   Stairs             Wheelchair Mobility    Modified Rankin (Stroke Patients Only)       Balance Overall balance assessment: Needs assistance;History of Falls Sitting-balance support: Feet supported Sitting balance-Leahy Scale: Fair Sitting balance - Comments: pt with posterior lean today and "flopping" body back at EOB and in BSC due to fatigue   Standing balance  support: Bilateral upper extremity supported Standing balance-Leahy Scale: Poor                              Cognition Arousal/Alertness: Awake/alert Behavior During Therapy: WFL for tasks assessed/performed Overall Cognitive  Status: History of cognitive impairments - at baseline                                 General Comments: pt responds to visual cues. speaks some english at times      Exercises      General Comments        Pertinent Vitals/Pain Pain Assessment: Faces Faces Pain Scale: Hurts little more Pain Location: L hip with movement (also SOB and CP) Pain Descriptors / Indicators: Grimacing;Discomfort;Moaning Pain Intervention(s): Limited activity within patient's tolerance;Monitored during session;Repositioned    Home Living                      Prior Function            PT Goals (current goals can now be found in the care plan section) Acute Rehab PT Goals Patient Stated Goal: family would like pt to be able to fly home to [place] Oct 4 PT Goal Formulation: With family Time For Goal Achievement: 07/09/21 Potential to Achieve Goals: Good Progress towards PT goals: Progressing toward goals (slow)    Frequency    Min 5X/week      PT Plan Current plan remains appropriate    Co-evaluation              AM-PAC PT "6 Clicks" Mobility   Outcome Measure  Help needed turning from your back to your side while in a flat bed without using bedrails?: A Little Help needed moving from lying on your back to sitting on the side of a flat bed without using bedrails?: A Little Help needed moving to and from a bed to a chair (including a wheelchair)?: A Little Help needed standing up from a chair using your arms (e.g., wheelchair or bedside chair)?: A Little Help needed to walk in hospital room?: A Little Help needed climbing 3-5 steps with a railing? : A Lot 6 Click Score: 17    End of Session Equipment Utilized During Treatment: Gait belt Activity Tolerance: Patient tolerated treatment well Patient left: in bed;with call bell/phone within reach;with bed alarm set Nurse Communication: Mobility status PT Visit Diagnosis: Other abnormalities of gait and mobility  (R26.89);Muscle weakness (generalized) (M62.81)     Time: 7262-0355 PT Time Calculation (min) (ACUTE ONLY): 27 min  Charges:  $Gait Training: 8-22 mins $Therapeutic Activity: 8-22 mins                     Wynn Maudlin, DPT Acute Rehabilitation Services Office [phone] Pager [phone]    Anitra Lauth 07/05/2021, 5:03 PM

## 2021-07-05 NOTE — TOC Progression Note (Signed)
Transition of Care [facility]) - Progression Note    Patient Details  Name: Catherine Terrell MRN: [id number] Date of Birth: 27-Jun-1933  Transition of Care [facility]) CM/SW Contact  Amada Jupiter, LCSW Phone Number: 07/05/2021, 2:15 PM  Clinical Narrative:    Spoke with pt's son today to review plans for discharge on Tuesday 10/4.  Son plans to be here at noon to pick up his mother and then will drive to [place] where they will have a direct flight to [place].  Will ask MD if any prescriptions could be sent to [facility] pharmacy so son can pick up prior to dc.  Son has made arrangements with airport for wc assistance to meet them at entry point.  Appears all ready for dc next week.   Expected Discharge Plan: Skilled Nursing Facility Barriers to Discharge: Financial Resources, Inadequate or no insurance, Continued Medical Work up  Expected Discharge Plan and Services Expected Discharge Plan: Skilled Nursing Facility In-house Referral: Clinical Social Work   Post Acute Care Choice: Skilled Nursing Facility                                         Social Determinants of Health (SDOH) Interventions    Readmission Risk Interventions No flowsheet data found.

## 2021-07-06 NOTE — Progress Notes (Signed)
Physical Therapy Treatment Patient Details Name: Catherine Terrell MRN: [id number] DOB: [birth year] Today's Date: 07/06/2021   History of Present Illness 87 yo female adm 05/31/21 after fall resulting in L IT hip fx. Pt s/p s/p ORIF IM Nail 06/02/21. 07/02/21 f/u hip and pelvis xray shows IM nail with anatomical alignment, mild callous formation, no other fxs.  PMH: bil TKAs (done in [place]), dementia, interstitial lung disease.    PT Comments     Worked with OT for ambulation to and from the bathroom and toileting. Pt is a minA with walking but still a little unpredictable with pt's tolerance of activity so 2 person for ambulation still appropriate due to this. PT did "pant" with HR up to 144 while on toilet. Unsure if this is anxiety, or due to pt ambulation and toileting. Notified nurse as well.  Pt mostly minA but more cuing and assist with RW due to unsafe with RW getting too far or pt putting it to the side to sit prematurely. Pt does respond to tactile cueing and stopping behavior to get pt back on track.   Wrote tips and safety instructions for pt's son for transfers and mobility with RW and gait belt. Also printed transfer instructions, and home exercise program with pictures for pt's son as well. Will continue to educate and work with pt and son for safe mobility with transition to [place].    Recommendations for follow up therapy are one component of a multi-disciplinary discharge planning process, led by the attending physician.  Recommendations may be updated based on patient status, additional functional criteria and insurance authorization.  Follow Up Recommendations  Other (comment);Supervision for mobility/OOB     Equipment Recommendations  Rolling walker with 5" wheels    Recommendations for Other Services       Precautions / Restrictions Precautions Precautions: Fall Precaution Comments: DOE Restrictions LLE Weight Bearing: Partial weight bearing (difficulty to maintain  partial WB with dementia and language barrier.)     Mobility  Bed Mobility Overal bed mobility: Needs Assistance Bed Mobility: Sit to Supine Rolling: Min guard         General bed mobility comments: slight assistance with LEs to get them on bed, however pt sat EOB then plopped back. We had to encourage and assist the patient back up to sitting so she could then lay down properly with head on pillow instead of cross ways on bed. She then was able to assist once we slowed her down adn cueed her to lie down properly. pt able to scoot int eh bed and move with cues and hand gestures of getting herslef aligned in bed correctly.    Transfers Overall transfer level: Needs assistance Equipment used: Rolling walker (2 wheeled) Transfers: Sit to/from Stand Sit to Stand: Min assist         General transfer comment: min A or less on average with transfers, however most assist is for safety and cues for safe use of RW and turning all the way before sitting prematurely. Pt able to but impulsive or rushing all movments at times. Have to hold to RW so she doens't push it too far forward or out of the way to pivot. If you are stern and tell her to stop and slow down she will listen but you have to resist her unsafe use of RW and mobility.  Ambulation/Gait Ambulation/Gait assistance: Min assist Gait Distance (Feet): 12 Feet (twice to and from the bathroom during this visit) Assistive device: Rolling  walker (2 wheeled) Gait Pattern/deviations: Step-to pattern;Trunk flexed;Narrow base of support     General Gait Details: Mon A is mostly for guidance and safe use of RW . Pt still holds LLE in IR with sitting and with ambulation. Also "pants" breathing hard when moving and after sittign on toilet , HR at this time was 144 bpm, and then once returned to bed and supine returned to 120 bpm and breathign slowed down as well.   Stairs             Wheelchair Mobility    Modified Rankin (Stroke  Patients Only)       Balance Overall balance assessment: Needs assistance;History of Falls Sitting-balance support: Feet supported Sitting balance-Leahy Scale: Fair Sitting balance - Comments: pt with posterior lean today and "flopping" body back at EOB and in BSC due to fatigue   Standing balance support: Bilateral upper extremity supported Standing balance-Leahy Scale: Poor                              Cognition Arousal/Alertness: Awake/alert Behavior During Therapy: WFL for tasks assessed/performed Overall Cognitive Status: History of cognitive impairments - at baseline                                 General Comments: pt responds to visual cues. speaks some english at times      Exercises      General Comments        Pertinent Vitals/Pain Pain Assessment: Faces Faces Pain Scale: Hurts a little bit Pain Location: pt did not grimace or point to hip durign session, only to stomach and chest when moving and breathing harder.    Home Living                      Prior Function            PT Goals (current goals can now be found in the care plan section) Acute Rehab PT Goals Patient Stated Goal: family would like pt to be able to fly home to [facility] Oct 4 PT Goal Formulation: With family Time For Goal Achievement: 07/09/21 Potential to Achieve Goals: Good Progress towards PT goals: Progressing toward goals    Frequency    Min 5X/week      PT Plan Current plan remains appropriate    Co-evaluation PT/OT/SLP Co-Evaluation/Treatment: Yes            AM-PAC PT "6 Clicks" Mobility   Outcome Measure  Help needed turning from your back to your side while in a flat bed without using bedrails?: A Little Help needed moving from lying on your back to sitting on the side of a flat bed without using bedrails?: A Little Help needed moving to and from a bed to a chair (including a wheelchair)?: A Little Help needed standing up from a  chair using your arms (e.g., wheelchair or bedside chair)?: A Little Help needed to walk in hospital room?: A Lot Help needed climbing 3-5 steps with a railing? : A Lot 6 Click Score: 16    End of Session Equipment Utilized During Treatment: Gait belt Activity Tolerance: Patient tolerated treatment well Patient left: in bed;with call bell/phone within reach;with bed alarm set Nurse Communication: Mobility status PT Visit Diagnosis: Other abnormalities of gait and mobility (R26.89);Muscle weakness (generalized) (M62.81)     Time:  1330-1410 PT Time Calculation (min) (ACUTE ONLY): 40 min  Charges:  $Gait Training: 8-22 mins                     Arlynn Mcdermid, PT, MPT Acute Rehabilitation Services Office: [phone] Pager: [phone] 07/06/2021    Marella Bile 07/06/2021, 2:54 PM

## 2021-07-06 NOTE — Progress Notes (Signed)
PROGRESS NOTE    TREA CARNEGIE  KZS:[id number] DOB: 1933-08-13 DOA: 05/31/2021 PCP: System, Provider Not In    No chief complaint on file.   Brief Narrative:  Patient is a 87 year old female with history of interstitial lung disease, hypothyroidism, hyperlipidemia, cognitive impairment who presented secondary to fall.  After fall,she developed left hip pain.  Imaging showed left hip fracture.  Orthopedics consulted and underwent IM nailing.  Hospital course remarkable for acute blood loss anemia requiring 2 L units of PRBC.  PT/OT recommended skilled nursing facility on discharge but placement is difficult due to lack of insurance.  Medically stable for discharge.  TOC following.  His son is planning to take her to [place] on early October.  Subjective:  No acute over night event She is calm and no acute changes  She is sitting up in chair, son at bedside   She is hard of hearing, she is very appreciative about the service she received   Medically stable to discharge  Assessment & Plan:   Principal Problem:   Closed left hip fracture (HCC) Active Problems:   Interstitial lung disease (HCC)   Hypothyroidism   MCI (mild cognitive impairment)   Left hip fracture: Secondary to fall in the bathroom.After fall,she developed left hip pain.  Imaging showed left hip fracture.  Orthopedics consulted and underwent IM nailing.  On aspirin twice daily for DVT prophylaxis. Last ortho note by Dr Ranell Patrick on 9/5   Acute blood loss anemia: Postoperatively.  Underwent 2 units  of blood transfusion.  Currently hemoglobin stable   History of interstitial lung disease/COPD pulmonary fibrosis/chronic respiratory failure with hypoxia: Currently stable on room air.  Follows with pulm as an outpatient.  On pirfenidone   Hypothyroidism: On Synthyroid   Cognitive impairment/dementia: Confused at baseline.  On Aricept.  Monitor mental status.  Delirium precautions   Acute urinary retention:  Resolved.  Foley taken out   Hyperlipidemia :on Lipitor   Mildly elevated liver enzymes: Unclear etiology.  No abdominal pain.  Ultrasound of the right upper quadrant did not show any obvious pathology of the liver parenchyma   Hyponatremia: Mild, continue to monitor Appear dehydrated, will give hydration, encourage oral intake   Disposition: Patient is originally from [place], lives with her youngest son.  She came to visit her elder son.  At her son's place, she fell in the bathroom near commode and broke her hip.  She has insurance from [place] with does not work in [place].  Her son is not able to take care of her here.  That is why she had prolonged hospitalization.  Her youngest son in [place] is traveling and is currently not at home.  Plan is to go to [place] in October  when her son will be back( his son will take her there by flight, son said he already has booked tickets).TOC was also looking for short-term rehab over here.   Nutritional Assessment:  The patient's BMI is: Body mass index is 23.13 kg/m.Marland Kitchen  DVT prophylaxis: SCDs Start: 06/02/21 1455 SCDs Start: 05/31/21 2218   Code Status:full Family Communication: son at bedside on 9/29 and 10/1 Disposition:   Status is: Inpatient  Dispo: The patient is from: home              Anticipated d/c is to: SNF, son will bring patient back to [place]               Anticipated d/c date is: difficult placement  Consultants:  ortho  Procedures:  ORIF  Antimicrobials:   Anti-infectives (From admission, onward)    Start     Dose/Rate Route Frequency Ordered Stop   06/02/21 1830  ceFAZolin (ANCEF) IVPB 2g/100 mL premix        2 g 200 mL/hr over 30 Minutes Intravenous Every 6 hours 06/02/21 1454 06/03/21 0117   06/02/21 0830  ceFAZolin (ANCEF) IVPB 2g/100 mL premix        2 g 200 mL/hr over 30 Minutes Intravenous On call to O.R. 06/02/21 0744 06/02/21 1228   06/02/21 0600  ceFAZolin  (ANCEF) IVPB 2g/100 mL premix  Status:  Discontinued        2 g 200 mL/hr over 30 Minutes Intravenous On call to O.R. 06/01/21 1333 06/02/21 0748           Objective: Vitals:   07/05/21 0538 07/05/21 1330 07/05/21 2229 07/06/21 0620  BP: 137/75 (!) 146/74 (!) 153/71 (!) 143/77  Pulse: 71 86 74 67  Resp: 16 20 16 18   Temp:  97.7 F (36.5 C) 98.7 F (37.1 C) 98.2 F (36.8 C)  TempSrc:  Oral Oral Oral  SpO2: 98% 100% 98% 100%  Weight:      Height:        Intake/Output Summary (Last 24 hours) at 07/06/2021 1324 Last data filed at 07/06/2021 1000 Gross per 24 hour  Intake 240 ml  Output 900 ml  Net -660 ml   Filed Weights   05/31/21 2309 06/02/21 0233  Weight: 46.8 kg 46.8 kg    Examination:  General exam:  frail, NAD, hard of hearing  Respiratory system: Clear to auscultation. Respiratory effort normal. Cardiovascular system: S1 & S2 heard, RRR.  Gastrointestinal system: Abdomen is nondistended, soft and nontender.  Normal bowel sounds heard. Central nervous system: Alert and oriented.  Extremities: no edema Skin: No rashes, lesions or ulcers Psychiatry: calm, cooperative, no agitation    Data Reviewed: I have personally reviewed following labs and imaging studies  CBC: Recent Labs  Lab 07/05/21 0438  WBC 9.0  HGB 10.0*  HCT 31.2*  MCV 100.0  PLT 359    Basic Metabolic Panel: Recent Labs  Lab 07/05/21 0438  NA 135  K 4.3  CL 105  CO2 23  GLUCOSE 91  BUN 17  CREATININE 0.74  CALCIUM 9.1  MG 1.9    GFR: Estimated Creatinine Clearance: 32.3 mL/min (by C-G formula based on SCr of 0.74 mg/dL).  Liver Function Tests: Recent Labs  Lab 07/05/21 0438  AST 29  ALT 35  ALKPHOS 117  BILITOT 0.4  PROT 5.9*  ALBUMIN 2.6*    CBG: No results for input(s): GLUCAP in the last 168 hours.   No results found for this or any previous visit (from the past 240 hour(s)).       Radiology Studies: No results found.      Scheduled Meds:   aspirin  81 mg Oral BID   atorvastatin  10 mg Oral Daily   donepezil  5 mg Oral QHS   levothyroxine  100 mcg Oral Once per day on Sun Fri Sat   levothyroxine  50 mcg Oral Once per day on Mon Tue Wed Thu   memantine  5 mg Oral Daily   Pirfenidone  801 mg Oral TID   Continuous Infusions:     LOS: 36 days   Time spent: Sat Greater than 50% of this time was spent in counseling, explanation of diagnosis, planning of further  management, and coordination of care.   Voice Recognition Reubin Milan dictation system was used to create this note, attempts have been made to correct errors. Please contact the author with questions and/or clarifications.   Albertine Grates, MD PhD FACP [facility]  Available via Epic secure chat 7am-7pm for nonurgent issues Please page for urgent issues To page the attending provider between 7A-7P or the covering provider during after hours 7P-7A, please log into the web site [email or web address] and access using universal [place] password for that web site. If you do not have the password, please call the hospital operator.    07/06/2021, 1:24 PM

## 2021-07-06 NOTE — Plan of Care (Signed)
Plan of care discussed with the patient's son.

## 2021-07-06 NOTE — Progress Notes (Signed)
Occupational Therapy Treatment Patient Details Name: Catherine Terrell MRN: [id number] DOB: 05-Nov-1932 Today's Date: 07/06/2021   History of present illness 87 yo female adm 05/31/21 after fall resulting in L IT hip fx. Pt s/p s/p ORIF IM Nail 06/02/21. 07/02/21 f/u hip and pelvis xray shows IM nail with anatomical alignment, mild callous formation, no other fxs.  PMH: bil TKAs (done in [place]), dementia, interstitial lung disease.   OT comments  Patient was noted to have increased impulsive behaviors with functional mobility on this date. Patient completed transfer from recliner in room to commode with patient noting to have "pant" with HR increase to 144bpm while on commode. Nurse was made aware. Patient required stern multimodal cues to use AD appropriately to transition from toilet back to bed on this date. Patient's discharge plan remains appropriate at this time. OT will continue to follow acutely.     Recommendations for follow up therapy are one component of a multi-disciplinary discharge planning process, led by the attending physician.  Recommendations may be updated based on patient status, additional functional criteria and insurance authorization.    Follow Up Recommendations  SNF    Equipment Recommendations  3 in 1 bedside commode    Recommendations for Other Services      Precautions / Restrictions Precautions Precautions: Fall Precaution Comments: DOE Restrictions Weight Bearing Restrictions: Yes LLE Weight Bearing: Partial weight bearing LLE Partial Weight Bearing Percentage or Pounds: 50%       Mobility Bed Mobility Overal bed mobility: Needs Assistance Bed Mobility: Sit to Supine Rolling: Min guard   Supine to sit: Min assist;HOB elevated Sit to supine: Mod assist   General bed mobility comments: slight assistance with LEs to get them on bed, however pt sat EOB then plopped back. We had to encourage and assist the patient back up to sitting so she could then lay  down properly with head on pillow instead of cross ways on bed. She then was able to assist once we slowed her down adn cueed her to lie down properly. pt able to scoot int eh bed and move with cues and hand gestures of getting herslef aligned in bed correctly.    Transfers Overall transfer level: Needs assistance Equipment used: Rolling walker (2 wheeled) Transfers: Sit to/from Stand Sit to Stand: Min assist         General transfer comment: min A or less on average with transfers, however most assist is for safety and cues for safe use of RW and turning all the way before sitting prematurely. Pt able to but impulsive or rushing all movments at times. Have to hold to RW so she doens't push it too far forward or out of the way to pivot. If you are stern and tell her to stop and slow down she will listen but you have to resist her unsafe use of RW and mobility.    Balance Overall balance assessment: Mild deficits observed, not formally tested Sitting-balance support: Feet supported Sitting balance-Leahy Scale: Fair Sitting balance - Comments: pt with posterior lean today and "flopping" body back at EOB and in BSC due to fatigue   Standing balance support: Bilateral upper extremity supported Standing balance-Leahy Scale: Poor                             ADL either performed or assessed with clinical judgement   ADL  Toilet Transfer: Minimal assistance;Ambulation;RW;BSC Toilet Transfer Details (indicate cue type and reason): patient needed max multimodal cues to keep RW close, to participate in task and to attempt to maintain WB restrictions.   Toileting - Clothing Manipulation Details (indicate cue type and reason): patient was able to move gown to side but was noted to rock on the commode. patient did not clean secondary to no voiding. patient noted to have increased SOB with patient educated varois ways to slow breathing down. patietns HR  noted to increase with sitting on commode. patient was motivated to return to bed at end of session with patient pushing therapist out of the way to access bed.             Vision       Perception     Praxis      Cognition Arousal/Alertness: Awake/alert Behavior During Therapy: WFL for tasks assessed/performed Overall Cognitive Status: History of cognitive impairments - at baseline                                 General Comments: pt responds to visual cues. speaks some english at times        Exercises     Shoulder Instructions       General Comments      Pertinent Vitals/ Pain       Pain Assessment: Faces Faces Pain Scale: Hurts a little bit Pain Location: pt did not grimace or point to hip durign session, only to stomach and chest when moving and breathing harder. Pain Descriptors / Indicators: Grimacing;Discomfort;Moaning Pain Intervention(s): Limited activity within patient's tolerance;Monitored during session;Repositioned  Home Living                                          Prior Functioning/Environment              Frequency  Min 2X/week        Progress Toward Goals  OT Goals(current goals can now be found in the care plan section)     Acute Rehab OT Goals Patient Stated Goal: family would like pt to be able to fly home to [facility] Oct 4  Plan Discharge plan remains appropriate    Co-evaluation    PT/OT/SLP Co-Evaluation/Treatment: Yes Reason for Co-Treatment: Necessary to address cognition/behavior during functional activity;For patient/therapist safety PT goals addressed during session: Mobility/safety with mobility OT goals addressed during session: ADL's and self-care      AM-PAC OT "6 Clicks" Daily Activity     Outcome Measure   Help from another person eating meals?: None Help from another person taking care of personal grooming?: A Little Help from another person toileting, which includes using  toliet, bedpan, or urinal?: A Lot Help from another person bathing (including washing, rinsing, drying)?: A Lot Help from another person to put on and taking off regular upper body clothing?: A Little Help from another person to put on and taking off regular lower body clothing?: A Lot 6 Click Score: 16    End of Session Equipment Utilized During Treatment: Rolling walker;Gait belt  OT Visit Diagnosis: History of falling (Z91.81);Other abnormalities of gait and mobility (R26.89);Unsteadiness on feet (R26.81)   Activity Tolerance Patient tolerated treatment well   Patient Left in bed;with call bell/phone within reach;with bed alarm set   Nurse Communication  Other (comment) (nurse aware of HR increase during session)        Time: 1350-1413 OT Time Calculation (min): 23 min  Charges: OT General Charges $OT Visit: 1 Visit OT Treatments $Self Care/Home Management : 8-22 mins  Sharyn Blitz OTR/L, MS Acute Rehabilitation Department Office# [phone] Pager# [phone]   Chalmers Guest Terea Neubauer 07/06/2021, 4:18 PM

## 2021-07-07 NOTE — Progress Notes (Signed)
PROGRESS NOTE    Catherine Terrell  WUJ:[id number] DOB: [birth year] DOA: 05/31/2021 PCP: System, Provider Not In    No chief complaint on file.   Brief Narrative:  Patient is a 87 year old female with history of interstitial lung disease, hypothyroidism, hyperlipidemia, cognitive impairment who presented secondary to fall.  After fall,she developed left hip pain.  Imaging showed left hip fracture.  Orthopedics consulted and underwent IM nailing.  Hospital course remarkable for acute blood loss anemia requiring 2 L units of PRBC.  PT/OT recommended skilled nursing facility on discharge but placement is difficult due to lack of insurance.  Medically stable for discharge.  TOC following.  His son is planning to take her to [place] on early October.  Subjective:  No acute over night event She is calm and no acute changes  She is sitting up in chair, son at bedside   She is hard of hearing, she is very appreciative about the service she received   Medically stable to discharge  Assessment & Plan:   Principal Problem:   Closed left hip fracture (HCC) Active Problems:   Interstitial lung disease (HCC)   Hypothyroidism   MCI (mild cognitive impairment)   Left hip fracture: Secondary to fall in the bathroom.After fall,she developed left hip pain.  Imaging showed left hip fracture.  Orthopedics consulted and underwent IM nailing.  On aspirin twice daily for DVT prophylaxis. Seen by ortho Dr Charlann Boxer on 10/2   Acute blood loss anemia: Postoperatively.  Underwent 2 units  of blood transfusion.  Currently hemoglobin stable   History of interstitial lung disease/COPD pulmonary fibrosis/chronic respiratory failure with hypoxia: Currently stable on room air.  Follows with pulm as an outpatient.  On pirfenidone   Hypothyroidism: On Synthyroid   Cognitive impairment/dementia: Confused at baseline.  On Aricept.  Monitor mental status.  Delirium precautions   Acute urinary retention: Resolved.   Foley taken out   Hyperlipidemia :on Lipitor   Mildly elevated liver enzymes: Unclear etiology.  No abdominal pain.  Ultrasound of the right upper quadrant did not show any obvious pathology of the liver parenchyma   Hyponatremia: Mild, continue to monitor Appear dehydrated, will give hydration, encourage oral intake   Disposition: Patient is originally from [place], lives with her youngest son.  She came to visit her elder son.  At her son's place, she fell in the bathroom near commode and broke her hip.  She has insurance from [place] with does not work in [place].  Her son is not able to take care of her here.  That is why she had prolonged hospitalization.  Her youngest son in [place] is traveling and is currently not at home.  Plan is to go to [place] in October  when her son will be back( his son will take her there by flight, son said he already has booked tickets).TOC was also looking for short-term rehab over here.   Nutritional Assessment:  The patient's BMI is: Body mass index is 23.13 kg/m.Marland Kitchen  DVT prophylaxis: SCDs Start: 06/02/21 1455 SCDs Start: 05/31/21 2218   Code Status:full Family Communication: son at bedside on 9/29 and 10/1 Disposition:   Status is: Inpatient  Dispo: The patient is from: home              Anticipated d/c is to: SNF, son will bring patient back to [place]  on 10/4              Anticipated d/c date is: 10/4  Consultants:  ortho  Procedures:  ORIF  Antimicrobials:   Anti-infectives (From admission, onward)    Start     Dose/Rate Route Frequency Ordered Stop   06/02/21 1830  ceFAZolin (ANCEF) IVPB 2g/100 mL premix        2 g 200 mL/hr over 30 Minutes Intravenous Every 6 hours 06/02/21 1454 06/03/21 0117   06/02/21 0830  ceFAZolin (ANCEF) IVPB 2g/100 mL premix        2 g 200 mL/hr over 30 Minutes Intravenous On call to O.R. 06/02/21 0744 06/02/21 1228   06/02/21 0600  ceFAZolin (ANCEF) IVPB 2g/100 mL  premix  Status:  Discontinued        2 g 200 mL/hr over 30 Minutes Intravenous On call to O.R. 06/01/21 1333 06/02/21 0748           Objective: Vitals:   07/06/21 1445 07/06/21 2036 07/07/21 0505 07/07/21 1400  BP:  135/71 (!) 141/61 129/63  Pulse: (!) 105 75 63 70  Resp:  20 18 18   Temp:  (!) 97.5 F (36.4 C) (!) 97.4 F (36.3 C) 97.6 F (36.4 C)  TempSrc:  Oral Oral   SpO2:  100% 100% 100%  Weight:      Height:        Intake/Output Summary (Last 24 hours) at 07/07/2021 2009 Last data filed at 07/07/2021 1800 Gross per 24 hour  Intake 0 ml  Output 1100 ml  Net -1100 ml   Filed Weights   05/31/21 2309 06/02/21 0233  Weight: 46.8 kg 46.8 kg    Examination:  General exam:  frail, NAD, hard of hearing  Respiratory system: Clear to auscultation. Respiratory effort normal. Cardiovascular system: S1 & S2 heard, RRR.  Gastrointestinal system: Abdomen is nondistended, soft and nontender.  Normal bowel sounds heard. Central nervous system: Alert and oriented.  Extremities: no edema Skin: No rashes, lesions or ulcers Psychiatry: calm, cooperative, no agitation    Data Reviewed: I have personally reviewed following labs and imaging studies  CBC: Recent Labs  Lab 07/05/21 0438  WBC 9.0  HGB 10.0*  HCT 31.2*  MCV 100.0  PLT 359    Basic Metabolic Panel: Recent Labs  Lab 07/05/21 0438  NA 135  K 4.3  CL 105  CO2 23  GLUCOSE 91  BUN 17  CREATININE 0.74  CALCIUM 9.1  MG 1.9    GFR: Estimated Creatinine Clearance: 32.3 mL/min (by C-G formula based on SCr of 0.74 mg/dL).  Liver Function Tests: Recent Labs  Lab 07/05/21 0438  AST 29  ALT 35  ALKPHOS 117  BILITOT 0.4  PROT 5.9*  ALBUMIN 2.6*    CBG: No results for input(s): GLUCAP in the last 168 hours.   No results found for this or any previous visit (from the past 240 hour(s)).       Radiology Studies: No results found.      Scheduled Meds:  aspirin  81 mg Oral BID    atorvastatin  10 mg Oral Daily   donepezil  5 mg Oral QHS   levothyroxine  100 mcg Oral Once per day on Sun Fri Sat   levothyroxine  50 mcg Oral Once per day on Mon Tue Wed Thu   memantine  5 mg Oral Daily   Pirfenidone  801 mg Oral TID   Continuous Infusions:     LOS: 37 days   Time spent: Wed Greater than 50% of this time was spent in counseling, explanation of diagnosis, planning of  further management, and coordination of care.   Voice Recognition Reubin Milan dictation system was used to create this note, attempts have been made to correct errors. Please contact the author with questions and/or clarifications.   Albertine Grates, MD PhD FACP [facility]  Available via Epic secure chat 7am-7pm for nonurgent issues Please page for urgent issues To page the attending provider between 7A-7P or the covering provider during after hours 7P-7A, please log into the web site [email or web address] and access using universal [place] password for that web site. If you do not have the password, please call the hospital operator.    07/07/2021, 8:09 PM

## 2021-07-07 NOTE — Progress Notes (Signed)
Patient ID: Catherine Terrell, female   DOB: May 07, 1933, 87 y.o.   MRN: [id number] Subjective: 35 Days Post-Op Procedure(s) (LRB): INTRAMEDULLARY (IM) NAIL INTERTROCHANTRIC (Left)    Patient has remained stable since surgery.  Physical therapy has attempted to increase activity. No complications noted thus far  Objective:   VITALS:   Vitals:   07/06/21 2036 07/07/21 0505  BP: 135/71 (!) 141/61  Pulse: 75 63  Resp: 20 18  Temp: (!) 97.5 F (36.4 C) (!) 97.4 F (36.3 C)  SpO2: 100% 100%    Incision: dressing C/D/I  LABS Recent Labs    07/05/21 0438  HGB 10.0*  HCT 31.2*  WBC 9.0  PLT 359    Recent Labs    07/05/21 0438  NA 135  K 4.3  BUN 17  CREATININE 0.74  GLUCOSE 91    No results for input(s): LABPT, INR in the last 72 hours.  CLINICAL DATA:  ORIF left hip fracture   EXAM: DG HIP (WITH OR WITHOUT PELVIS) 1V*L*   COMPARISON:  05/31/2021   FINDINGS: Frontal view of the pelvis as well as a frogleg lateral view of the left hip are obtained. Intramedullary rod with proximal dynamic and distal interlocking screw traverses the previously identified intertrochanteric left hip fracture. Mild callus formation since previous study. No new fractures. Alignment is anatomic.   IMPRESSION: 1. ORIF intertrochanteric left hip fracture, with mild callus formation since prior study.     Electronically Signed   By: Sharlet Salina M.D   Assessment/Plan: 35 Days Post-Op Procedure(s) (LRB): INTRAMEDULLARY (IM) NAIL INTERTROCHANTRIC (Left)   Plan: I have reviewed radiographs roughly 5 weeks from surgery.  He left hip fracture remains stable with intact hardware.  She can be WBAT on the LLE She should have follow up established with an Orthopaedist upon getting settled in [place] Activity can progress as tolerable with regards to her hip fracture and current state of healing

## 2021-07-08 ENCOUNTER — Other Ambulatory Visit (HOSPITAL_COMMUNITY): Payer: Self-pay

## 2021-07-08 ENCOUNTER — Encounter (HOSPITAL_COMMUNITY): Payer: Self-pay | Admitting: Family Medicine

## 2021-07-08 MED ORDER — ASPIRIN 81 MG PO CHEW
81.0000 mg | CHEWABLE_TABLET | Freq: Two times a day (BID) | ORAL | 0 refills | Status: AC
  Filled 2021-07-08: qty 56, 28d supply, fill #0

## 2021-07-08 MED ORDER — HYDROCODONE-ACETAMINOPHEN 5-325 MG PO TABS
1.0000 | ORAL_TABLET | Freq: Four times a day (QID) | ORAL | 0 refills | Status: AC | PRN
  Filled 2021-07-08: qty 20, 5d supply, fill #0

## 2021-07-08 MED ORDER — DOCUSATE SODIUM 100 MG PO CAPS
100.0000 mg | ORAL_CAPSULE | Freq: Two times a day (BID) | ORAL | 0 refills | Status: AC
  Filled 2021-07-08: qty 10, 5d supply, fill #0

## 2021-07-08 MED ORDER — METHOCARBAMOL 500 MG PO TABS
500.0000 mg | ORAL_TABLET | Freq: Three times a day (TID) | ORAL | 0 refills | Status: AC | PRN
  Filled 2021-07-08: qty 30, 10d supply, fill #0

## 2021-07-08 MED ORDER — POLYETHYLENE GLYCOL 3350 17 G PO PACK
17.0000 g | PACK | Freq: Every day | ORAL | 0 refills | Status: AC | PRN
  Filled 2021-07-08: qty 14, 14d supply, fill #0

## 2021-07-08 NOTE — Progress Notes (Signed)
PROGRESS NOTE    ALEKSA Terrell  QVZ:[id number] DOB: 1933/02/09 DOA: 05/31/2021 PCP: System, Provider Not In    Brief Narrative:  Patient is a 87 year old female with history of interstitial lung disease, hypothyroidism, hyperlipidemia, cognitive impairment presented to hospital after sustaining a fall.  Patient was noted to have left hip fracture and orthopedics was consulted.  Patient underwent open reduction and internal fixation with IM nailing by orthopedics.  Patient also had acute blood loss anemia and required 2 units of packed RBC.  Physical therapy and Occupational Therapy recommended skilled nursing facility but currently patient is difficult placement.    Assessment & Plan:   Principal Problem:   Closed left hip fracture (HCC) Active Problems:   Interstitial lung disease (HCC)   Hypothyroidism   MCI (mild cognitive impairment)   Left hip fracture: Status post fall.  Status post IM nailing on 06/02/2021.  On aspirin twice daily for DVT prophylaxis.  Continue orthopedics.   Acute blood loss anemia: Status post 2 units of packed RBC.  No recent labs.   History of interstitial lung disease/COPD pulmonary fibrosis/chronic respiratory failure with hypoxia:   On pirfenidone.  Currently on room air appears compensated   Hypothyroidism: Continue Synthyroid   Cognitive impairment/dementia: On Aricept.  Continue delirium precautions.     Acute urinary retention: Has improved at this time.  Off Foley catheter   Hyperlipidemia : Continue Lipitor   Mildly elevated liver enzymes: LFT monitoring.  No abdominal pain.  Ultrasound without any acute findings.     Hyponatremia: We will monitor.  Get BMP in a.m.   Disposition: Patient is originally from [place], awaiting transfer back to [place].  DVT prophylaxis: SCDs Start: 06/02/21 1455 SCDs Start: 05/31/21 2218   Code Status: full code  Family Communication: None today  Disposition:   Status is: Inpatient  Dispo:  The patient is from: home              Anticipated d/c is to: SNF, patient's son to take patient to [place] on 10/4.              Anticipated d/c date is: 07/09/21                 Consultants:  Orthopedics  Procedures:  ORIF with IM nailing  Antimicrobials:   None   Subjective:  Today, patient was seen and examined at bedside.  Very hard of hearing.  Denies interval complaints.   Objective: Vitals:   07/07/21 1400 07/07/21 2035 07/08/21 0443 07/08/21 0445  BP: 129/63 (!) 160/68  123/81  Pulse: 70 71  76  Resp: 18 18  18   Temp: 97.6 F (36.4 C) (!) 97.5 F (36.4 C)    TempSrc:  Oral    SpO2: 100% 100%  98%  Weight:   47.6 kg   Height:        Intake/Output Summary (Last 24 hours) at 07/08/2021 1030 Last data filed at 07/08/2021 1021 Gross per 24 hour  Intake 220 ml  Output 1600 ml  Net -1380 ml    Filed Weights   05/31/21 2309 06/02/21 0233 07/08/21 0443  Weight: 46.8 kg 46.8 kg 47.6 kg    Physical examination: General: Frail-appearing, hard of hearing, not in obvious distress HENT:   No scleral pallor or icterus noted. Oral mucosa is moist.  Chest:  Clear breath sounds.  Diminished breath sounds bilaterally. No crackles or wheezes.  CVS: S1 &S2 heard. No murmur.  Regular rate and rhythm. Abdomen:  Soft, nontender, nondistended.  Bowel sounds are heard.   Extremities: No cyanosis, clubbing or edema.  Peripheral pulses are palpable. Psych: Alert, awake and communicative, normal mood CNS:  No cranial nerve deficits.  Power equal in all extremities.   Skin: Warm and dry.  No rashes noted.   Data Reviewed: I have personally reviewed the following labs and imaging studies.   CBC: Recent Labs  Lab 2021/07/23 0438  WBC 9.0  HGB 10.0*  HCT 31.2*  MCV 100.0  PLT 359     Basic Metabolic Panel: Recent Labs  Lab 07-23-21 0438  NA 135  K 4.3  CL 105  CO2 23  GLUCOSE 91  BUN 17  CREATININE 0.74  CALCIUM 9.1  MG 1.9     GFR: Estimated Creatinine  Clearance: 32.5 mL/min (by C-G formula based on SCr of 0.74 mg/dL).  Liver Function Tests: Recent Labs  Lab 07-23-21 0438  AST 29  ALT 35  ALKPHOS 117  BILITOT 0.4  PROT 5.9*  ALBUMIN 2.6*    CBG: No results for input(s): GLUCAP in the last 168 hours.   No results found for this or any previous visit (from the past 240 hour(s)).    Radiology Studies: No results found.   Scheduled Meds:  aspirin  81 mg Oral BID   atorvastatin  10 mg Oral Daily   donepezil  5 mg Oral QHS   levothyroxine  100 mcg Oral Once per day on Sun Fri Sat   levothyroxine  50 mcg Oral Once per day on Mon Tue Wed Thu   memantine  5 mg Oral Daily   Pirfenidone  801 mg Oral TID   Continuous Infusions:    LOS: 38 days   Joycelyn Das, MD  [facility] 07/08/2021, 10:30 AM

## 2021-07-08 NOTE — Progress Notes (Signed)
Physical Therapy Treatment Patient Details Name: Catherine Terrell MRN: [id number] DOB: 1932-11-06 Today's Date: 07/08/2021   History of Present Illness 87 yo female adm 05/31/21 after fall resulting in L IT hip fx. Pt s/p s/p ORIF IM Nail 06/02/21. 07/02/21 f/u hip and pelvis xray shows IM nail with anatomical alignment, mild callous formation, no other fxs.  PMH: bil TKAs (done in [place]), dementia, interstitial lung disease.    PT Comments    POD # 36 Limited session.  Assisted OOB to amb around bed only due to Pt's anxiety.   Pt plans to D/C tomorrow with family.   Recommendations for follow up therapy are one component of a multi-disciplinary discharge planning process, led by the attending physician.  Recommendations may be updated based on patient status, additional functional criteria and insurance authorization.  Follow Up Recommendations  Other (comment);Supervision for mobility/OOB     Equipment Recommendations  Rolling walker with 5" wheels    Recommendations for Other Services       Precautions / Restrictions Precautions Precautions: Fall Precaution Comments: anxiety Restrictions Weight Bearing Restrictions: No LLE Weight Bearing: Weight bearing as tolerated LLE Partial Weight Bearing Percentage or Pounds: Per Ortho note 10/02 pt now WBAT     Mobility  Bed Mobility Overal bed mobility: Needs Assistance Bed Mobility: Supine to Sit;Sit to Supine     Supine to sit: Min assist;HOB elevated Sit to supine: Mod assist   General bed mobility comments: MAX encouragement to participte.  Assisted OOB to amb to bathroom pt required freq rest breaks and appeared anxious/nervous that I did not have 2 people assisting.  "You by yourself" stated pt.  Assisted to feet she pushed back.  Indicating she need to take a moment.   Max visual cueing to avoid pulling on walker was unsuccessful.  Very unsteady.  MAX visual cueing for proper hand placement and upright posture.  With stand to  sit, pt is impulsive.  Reaching beyond her BOS for bed and incomplete turns.    Transfers Overall transfer level: Needs assistance Equipment used: Rolling walker (2 wheeled) Transfers: Sit to/from [employer] Sit to Stand: Min assist;Mod assist Stand pivot transfers: Mod assist;Max assist      Lateral/Scoot Transfers: Min assist General transfer comment: pt impulsive and attempting to sit before completing her turn and reaching beyond her center.  Increased anxiety with transfers.  Ambulation/Gait Ambulation/Gait assistance: Mod assist Gait Distance (Feet): 8 Feet Assistive device: Rolling walker (2 wheeled) Gait Pattern/deviations: Step-to pattern;Trunk flexed;Narrow base of support Gait velocity: decreased   General Gait Details: Pt with increased anxiety and nervousness that Therapist did not have another person to assist.  Only amb around the bed with walker.  Pt reaching for bed for support vs using walker correctly.  Limited distance by pt.   Stairs             Wheelchair Mobility    Modified Rankin (Stroke Patients Only)       Balance                                            Cognition Arousal/Alertness: Awake/alert Behavior During Therapy: WFL for tasks assessed/performed Overall Cognitive Status: History of cognitive impairments - at baseline  General Comments: AxO x 2 speaks enough English to extress her needs.  Today she wanted a bananna.  Increased anxiety with amb to bathroom.      Exercises      General Comments        Pertinent Vitals/Pain Pain Assessment: No/denies pain    Home Living                      Prior Function            PT Goals (current goals can now be found in the care plan section) Progress towards PT goals: Progressing toward goals    Frequency    Min 5X/week      PT Plan Current plan remains appropriate     Co-evaluation              AM-PAC PT "6 Clicks" Mobility   Outcome Measure  Help needed turning from your back to your side while in a flat bed without using bedrails?: A Little Help needed moving from lying on your back to sitting on the side of a flat bed without using bedrails?: A Little Help needed moving to and from a bed to a chair (including a wheelchair)?: A Little Help needed standing up from a chair using your arms (e.g., wheelchair or bedside chair)?: A Little Help needed to walk in hospital room?: A Lot Help needed climbing 3-5 steps with a railing? : A Lot 6 Click Score: 16    End of Session Equipment Utilized During Treatment: Gait belt Activity Tolerance: Patient tolerated treatment well Patient left: in bed;with call bell/phone within reach;with bed alarm set   PT Visit Diagnosis: Other abnormalities of gait and mobility (R26.89);Muscle weakness (generalized) (M62.81)     Time: 1206-1228 PT Time Calculation (min) (ACUTE ONLY): 22 min  Charges:  $Gait Training: 8-22 mins                     Felecia Shelling  PTA Acute  Rehabilitation Services Pager      [phone] Office      [phone]

## 2021-07-09 LAB — COMPREHENSIVE METABOLIC PANEL
ALT: 29 U/L (ref 0–44)
AST: 26 U/L (ref 15–41)
Albumin: 2.8 g/dL — ABNORMAL LOW (ref 3.5–5.0)
Alkaline Phosphatase: 105 U/L (ref 38–126)
Anion gap: 4 — ABNORMAL LOW (ref 5–15)
BUN: 11 mg/dL (ref 8–23)
CO2: 26 mmol/L (ref 22–32)
Calcium: 8.9 mg/dL (ref 8.9–10.3)
Chloride: 103 mmol/L (ref 98–111)
Creatinine, Ser: 0.78 mg/dL (ref 0.44–1.00)
GFR, Estimated: >60 mL/min (ref >60)
Glucose, Bld: 102 mg/dL — ABNORMAL HIGH (ref 70–99)
Potassium: 4.5 mmol/L (ref 3.5–5.1)
Sodium: 133 mmol/L — ABNORMAL LOW (ref 135–145)
Total Bilirubin: 0.5 mg/dL (ref 0.3–1.2)
Total Protein: 6.1 g/dL — ABNORMAL LOW (ref 6.5–8.1)

## 2021-07-09 LAB — CBC
HCT: 33.4 % — ABNORMAL LOW (ref 36.0–46.0)
Hemoglobin: 11 g/dL — ABNORMAL LOW (ref 12.0–15.0)
MCH: 32 pg (ref 26.0–34.0)
MCHC: 32.9 g/dL (ref 30.0–36.0)
MCV: 97.1 fL (ref 80.0–100.0)
Platelets: 371 10*3/uL (ref 150–400)
RBC: 3.44 MIL/uL — ABNORMAL LOW (ref 3.87–5.11)
RDW: 14.1 % (ref 11.5–15.5)
WBC: 10.9 10*3/uL — ABNORMAL HIGH (ref 4.0–10.5)
nRBC: 0 % (ref 0.0–0.2)

## 2021-07-09 LAB — MAGNESIUM: Magnesium: 1.8 mg/dL (ref 1.7–2.4)

## 2021-07-09 NOTE — Plan of Care (Signed)
°  Problem: Safety: °Goal: Ability to remain free from injury will improve °Outcome: Progressing °  °Problem: Activity: °Goal: Risk for activity intolerance will decrease °Outcome: Progressing °  °

## 2021-07-09 NOTE — TOC Transition Note (Signed)
Transition of Care [facility]) - CM/SW Discharge Note   Patient Details  Name: Catherine Terrell MRN: [id number] Date of Birth: [birth year]  [facility]) CM/SW Contact:  Amada Jupiter, LCSW Phone Number: 07/09/2021, 1:36 PM   Clinical Narrative:    Pt ready for dc today.  Son has made arrangements with airlines for wc assistance as he travels with pt back to her home in [place].  Per son request, records were faxed to a SNF in [place], [place]  as the other son has been working with that facility to consider SNF for pt there.  No further TOC needs.   Final next level of care: Home/Self Care Barriers to Discharge: Barriers Resolved   Patient Goals and CMS Choice        Discharge Placement                       Discharge Plan and Services In-house Referral: Clinical Social Work   Post Acute Care Choice: Skilled Nursing Facility          DME Arranged: N/A DME Agency: NA                  Social Determinants of Health (SDOH) Interventions     Readmission Risk Interventions No flowsheet data found.

## 2021-07-09 NOTE — Discharge Summary (Signed)
Physician Discharge Summary  Catherine Terrell UYQ:[id number] DOB: 08/29/1933 DOA: 05/31/2021  PCP: System, Provider Not In  Admit date: 05/31/2021 Discharge date: 07/09/2021  Admitted From: Home  Discharge disposition: Home  Recommendations for Outpatient Follow-Up:   Follow up with your primary care provider in one week.  Follow-up with orthopedics in 2 weeks Check CBC, BMP, magnesium in the next visit  Discharge Diagnosis:   Principal Problem:   Closed left hip fracture (HCC) Active Problems:   Interstitial lung disease (HCC)   Hypothyroidism   MCI (mild cognitive impairment)   Discharge Condition: Improved.  Diet recommendation: Vegetarian diet  Wound care: None.  Code status: Full.  History of Present Illness:   Patient is a 87 year old female with history of interstitial lung disease, hypothyroidism, hyperlipidemia, cognitive impairment presented to hospital after sustaining a fall.  Patient was noted to have left hip fracture and orthopedics was consulted.  Patient underwent open reduction and internal fixation with IM nailing by orthopedics.  Patient also had acute blood loss anemia and required 2 units of packed RBC.  Physical therapy and Occupational Therapy recommended skilled nursing facility.  Patient did have a prolonged hospitalization due to disposition issues.  Hospital Course:   Following conditions were addressed during hospitalization as listed below,  Left hip fracture: Status post fall.  Status post IM nailing on 06/02/2021.  On aspirin twice daily for DVT prophylaxis.  Continue orthopedics.   Acute blood loss anemia: Status post 2 units of packed RBC.  Hemoglobin of 11.0 prior to discharge   History of interstitial lung disease/COPD pulmonary fibrosis/chronic respiratory failure with hypoxia:   On pirfenidone.  Currently on room air appears compensated.   Hypothyroidism: Continue Synthyroid   Cognitive impairment/dementia: On Aricept.     Acute  urinary retention: Has improved at this time.  Off Foley catheter   Hyperlipidemia : Continue Lipitor on discharge.   Mildly elevated liver enzymes: Resolved.  No abdominal pain.  Ultrasound without any acute findings.     Hyponatremia: Mild.   Disposition: Patient is originally from [place], awaiting transfer back to [place] today.  Disposition.  At this time, patient is stable for disposition home with outpatient PCP and orthopedic follow-up.  Plan is to go back home to [place].  Medical Consultants:   Orthopedic surgery  Procedures:    post IM nailing on 06/02/2021.  PRBC transfusion Subjective:   Today, patient was seen and examined at bedside.  Hard of hearing.  Denies overt pain.  No interval complaints reported  Discharge Exam:   Vitals:   07/08/21 2309 07/09/21 0615  BP: 130/70 130/78  Pulse: 78 (!) 108  Resp: 16 16  Temp: 97.6 F (36.4 C) 97.6 F (36.4 C)  SpO2: 98% 98%   Vitals:   07/08/21 0445 07/08/21 1331 07/08/21 2309 07/09/21 0615  BP: 123/81 136/83 130/70 130/78  Pulse: 76 80 78 (!) 108  Resp: 18 16 16 16   Temp:   97.6 F (36.4 C) 97.6 F (36.4 C)  TempSrc:    Oral  SpO2: 98% 100% 98% 98%  Weight:      Height:        General: Alert awake, not in obvious distress, hard of hearing, frail appearing HENT: pupils equally reacting to light,  No scleral pallor or icterus noted. Oral mucosa is moist.  Chest:  Clear breath sounds.  Diminished breath sounds bilaterally. No crackles or wheezes.  CVS: S1 &S2 heard. No murmur.  Regular rate and rhythm. Abdomen:  Soft, nontender, nondistended.  Bowel sounds are heard.   Extremities: No cyanosis, clubbing or edema.  Peripheral pulses are palpable.  Left hip surgical site healthy Psych: Alert, awake and communicative, CNS:  No cranial nerve deficits.  Moving all extremities. Skin: Warm and dry.  No rashes noted.  The results of significant diagnostics from this hospitalization (including imaging,  microbiology, ancillary and laboratory) are listed below for reference.     Diagnostic Studies:   CT Head Wo Contrast  Result Date: 05/31/2021 CLINICAL DATA:  Un witnessed fall, left periorbital bruising EXAM: CT HEAD WITHOUT CONTRAST TECHNIQUE: Contiguous axial images were obtained from the base of the skull through the vertex without intravenous contrast. COMPARISON:  None. FINDINGS: Brain: No acute infarct or hemorrhage. Confluent hypodensities throughout the periventricular and subcortical white matter consistent with chronic small vessel ischemic change. There is diffuse cerebral atrophy with ex vacuo dilatation of the lateral ventricles. No acute extra-axial fluid collections. No mass effect. Vascular: Diffuse atherosclerosis.  No hyperdense vessel. Skull: Normal. Negative for fracture or focal lesion. Sinuses/Orbits: No acute finding. Impacted cerumen within the left external auditory canal. Postsurgical changes are seen within the right middle ear and mastoid. Other: None. IMPRESSION: 1. No acute intracranial trauma. 2. Chronic small-vessel ischemic changes throughout the white matter. Electronically Signed   By: Sharlet Salina M.D.   On: 05/31/2021 18:25   CT Cervical Spine Wo Contrast  Result Date: 05/31/2021 CLINICAL DATA:  Un witnessed fall, left periorbital bruising EXAM: CT CERVICAL SPINE WITHOUT CONTRAST TECHNIQUE: Multidetector CT imaging of the cervical spine was performed without intravenous contrast. Multiplanar CT image reconstructions were also generated. COMPARISON:  None. FINDINGS: Alignment: Slight reversal cervical lordosis at the C4-5 level, likely due to spondylosis and facet hypertrophy. Otherwise alignment is anatomic. Skull base and vertebrae: Evaluation of the mid cervical spine is limited due to patient motion. There are no acute displaced fractures. No destructive bony lesions. Soft tissues and spinal canal: No prevertebral fluid or swelling. No visible canal hematoma.  Disc levels: There is severe spondylosis at C4-5. Prominent facet hypertrophic changes are seen on the right from C2 through C5, with bony fusion across the right C3-C4 facet. Upper chest: There is marked bronchiectasis within the visualized right upper lobe, with associated interstitial prominence and consolidation. Findings could reflect sequela of chronic fibrosis, though superimposed infection cannot be excluded. Other: Reconstructed images demonstrate no additional findings. IMPRESSION: 1. Limited evaluation of the mid cervical spine due to motion. 2. No acute displaced fractures. 3. Prominent spondylosis and facet hypertrophy as above. 4. Right upper lobe consolidation with interstitial prominence and bronchiectasis. Findings could reflect fibrosis and scarring, though superimposed infection cannot be excluded given unilateral findings. Electronically Signed   By: Sharlet Salina M.D.   On: 05/31/2021 18:28   DG Hip Unilat W or Wo Pelvis 2-3 Views Left  Result Date: 05/31/2021 CLINICAL DATA:  Larey Seat, left leg deformity EXAM: DG HIP (WITH OR WITHOUT PELVIS) 2-3V LEFT COMPARISON:  None. FINDINGS: Frontal view of the pelvis as well as frontal and cross-table lateral views of the left hip are obtained. There is a comminuted impacted intertrochanteric left hip fracture with varus angulation. No dislocation. No other acute displaced fractures. Marked enthesopathic changes versus osteochondroma off the left anterior iliac crest. Sacroiliac joints are normal. Mild lower lumbar degenerative changes. IMPRESSION: 1. Comminuted impacted intertrochanteric left hip fracture, with varus angulation. Electronically Signed   By: Sharlet Salina M.D.   On: 05/31/2021 18:34  Labs:   Basic Metabolic Panel: Recent Labs  Lab 07/05/21 0438 07/09/21 0430  NA 135 133*  K 4.3 4.5  CL 105 103  CO2 23 26  GLUCOSE 91 102*  BUN 17 11  CREATININE 0.74 0.78  CALCIUM 9.1 8.9  MG 1.9 1.8   GFR Estimated Creatinine  Clearance: 32.5 mL/min (by C-G formula based on SCr of 0.78 mg/dL). Liver Function Tests: Recent Labs  Lab 07/05/21 0438 07/09/21 0430  AST 29 26  ALT 35 29  ALKPHOS 117 105  BILITOT 0.4 0.5  PROT 5.9* 6.1*  ALBUMIN 2.6* 2.8*   No results for input(s): LIPASE, AMYLASE in the last 168 hours. No results for input(s): AMMONIA in the last 168 hours. Coagulation profile No results for input(s): INR, PROTIME in the last 168 hours.  CBC: Recent Labs  Lab 07/05/21 0438 07/09/21 0430  WBC 9.0 10.9*  HGB 10.0* 11.0*  HCT 31.2* 33.4*  MCV 100.0 97.1  PLT 359 371   Cardiac Enzymes: No results for input(s): CKTOTAL, CKMB, CKMBINDEX, TROPONINI in the last 168 hours. BNP: Invalid input(s): POCBNP CBG: No results for input(s): GLUCAP in the last 168 hours. D-Dimer No results for input(s): DDIMER in the last 72 hours. Hgb A1c No results for input(s): HGBA1C in the last 72 hours. Lipid Profile No results for input(s): CHOL, HDL, LDLCALC, TRIG, CHOLHDL, LDLDIRECT in the last 72 hours. Thyroid function studies No results for input(s): TSH, T4TOTAL, T3FREE, THYROIDAB in the last 72 hours.  Invalid input(s): FREET3 Anemia work up No results for input(s): VITAMINB12, FOLATE, FERRITIN, TIBC, IRON, RETICCTPCT in the last 72 hours. Microbiology No results found for this or any previous visit (from the past 240 hour(s)).   Discharge Instructions:   Discharge Instructions     Diet - low sodium heart healthy   Complete by: As directed    Discharge instructions   Complete by: As directed    Follow-up with your primary care physician in 1 week.  Follow-up with orthopedic surgery in 2 weeks.  Continue physical therapy after discharge.   Increase activity slowly   Complete by: As directed    No wound care   Complete by: As directed       Allergies as of 07/09/2021       Reactions   Beef-derived Products Other (See Comments)   VEGETARIAN- EATS NO MEAT!!   Chicken Protein Other  (See Comments)   VEGETARIAN- EATS NO MEAT!!   Eggs Or Egg-derived Products Other (See Comments)   VEGETARIAN- EATS NO EGGS!!!   Pork-derived Products Other (See Comments)   VEGETARIAN- EATS NO MEAT!!        Medication List     STOP taking these medications    aspirin EC 81 MG tablet Replaced by: aspirin 81 MG chewable tablet   ibuprofen 200 MG tablet Commonly known as: ADVIL   traMADol 50 MG tablet Commonly known as: ULTRAM       TAKE these medications    acetaminophen 500 MG tablet Commonly known as: TYLENOL Take 1,000 mg by mouth 2 (two) times daily.   ADVANCED PROBIOTIC PO Take 1 tablet by mouth See admin instructions. Advanced Digestive Probiotic: Take 1 tablet by mouth in the morning   aspirin 81 MG chewable tablet Commonly known as: Aspirin Childrens Chew 1 tablet by mouth in the morning and at bedtime for 28 days. Replaces: aspirin EC 81 MG tablet   atorvastatin 10 MG tablet Commonly known as: LIPITOR Take 10 mg  by mouth in the morning.   Belsomra 5 MG Tabs Generic drug: Suvorexant Take 5 mg by mouth at bedtime as needed (for insomnia).   docusate sodium 100 MG capsule Commonly known as: COLACE Take 1 capsule (100 mg total) by mouth 2 (two) times daily.   donepezil 5 MG tablet Commonly known as: ARICEPT Take 5 mg by mouth at bedtime.   HYDROcodone-acetaminophen 5-325 MG tablet Commonly known as: NORCO/VICODIN Take 1 tablet by mouth every 6 (six) hours as needed for severe pain.   levothyroxine 50 MCG tablet Commonly known as: SYNTHROID Take 50-100 mcg by mouth See admin instructions. Take 100 mcg by mouth in the morning before breakfast on Sun/Fri/Sat and 50 mcg on Mon/Tues/Wed/Thurs   MATURE BALANCE PO Take 1 tablet by mouth See admin instructions. [place] Adult 50+ Mature multivitamin tablet- Take 1 tablet by mouth at noontime   memantine 5 MG tablet Commonly known as: NAMENDA Take 5 mg by mouth in the morning.   methocarbamol 500 MG  tablet Commonly known as: ROBAXIN Take 1 tablet (500 mg total) by mouth every 8 (eight) hours as needed for muscle spasms.   Pirfenidone 267 MG Tabs Take 801 mg by mouth in the morning, at noon, and at bedtime.   polyethylene glycol 17 g packet Commonly known as: MIRALAX / GLYCOLAX Mix 17 g as directed and take by mouth daily as needed for mild constipation.        Follow-up Information     Durene Romans, MD. Schedule an appointment as soon as possible for a visit in 2 week(s).   Specialty: Orthopedic Surgery Contact information: [address] [place] [address] [phone]                  Time coordinating discharge: 39 minutes  Signed:  Dilraj Killgore  [facility] 07/09/2021, 10:12 AM

## 2021-07-09 NOTE — Plan of Care (Signed)
  Problem: Education: Goal: Verbalization of understanding the information provided (i.e., activity precautions, restrictions, etc) will improve Outcome: Adequate for Discharge   Problem: Activity: Goal: Ability to ambulate and perform ADLs will improve Outcome: Adequate for Discharge   Problem: Clinical Measurements: Goal: Postoperative complications will be avoided or minimized Outcome: Adequate for Discharge   Problem: Self-Concept: Goal: Ability to maintain and perform role responsibilities to the fullest extent possible will improve Outcome: Adequate for Discharge   Problem: Health Behavior/Discharge Planning: Goal: Ability to manage health-related needs will improve Outcome: Adequate for Discharge   Problem: Activity: Goal: Risk for activity intolerance will decrease Outcome: Adequate for Discharge   Problem: Coping: Goal: Level of anxiety will decrease Outcome: Adequate for Discharge   Problem: Safety: Goal: Ability to remain free from injury will improve Outcome: Adequate for Discharge

## 2021-10-06 IMAGING — US US ABDOMEN LIMITED
1 series · 15 of 23 positions shown · non-contrast
Comparison: None.

CLINICAL DATA: Elevated LFTs

EXAM:
ULTRASOUND ABDOMEN LIMITED RIGHT UPPER QUADRANT

[Series 1: us abdomen limited · 15 of 23 slices shown]
[im 1/23]
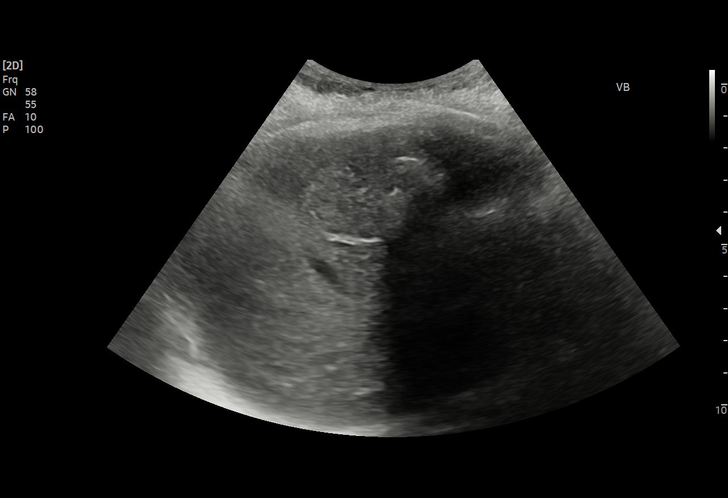
[im 3/23]
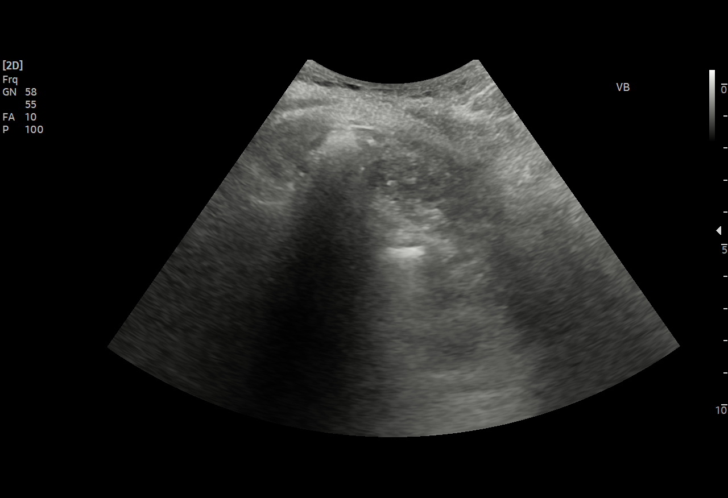
[im 4/23]
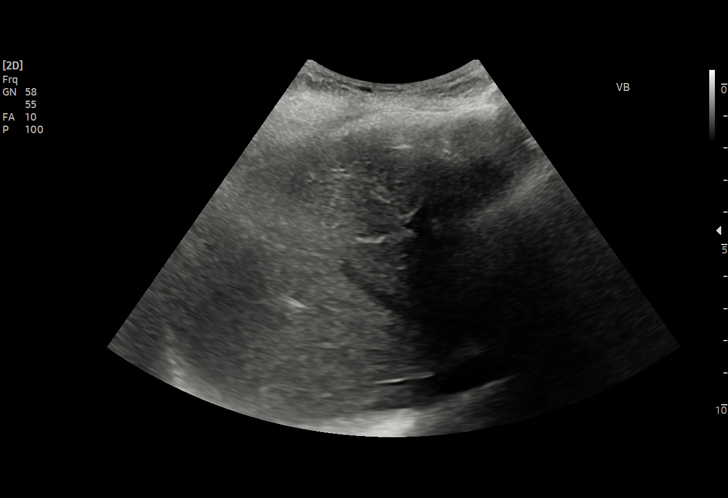
[im 6/23]
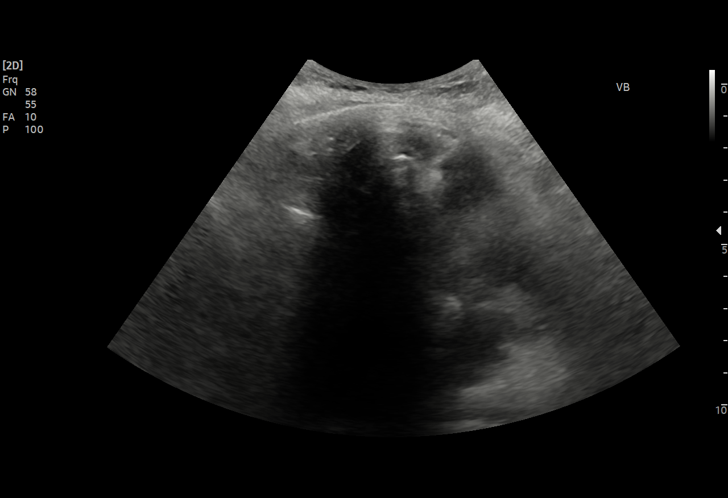
[im 7/23]
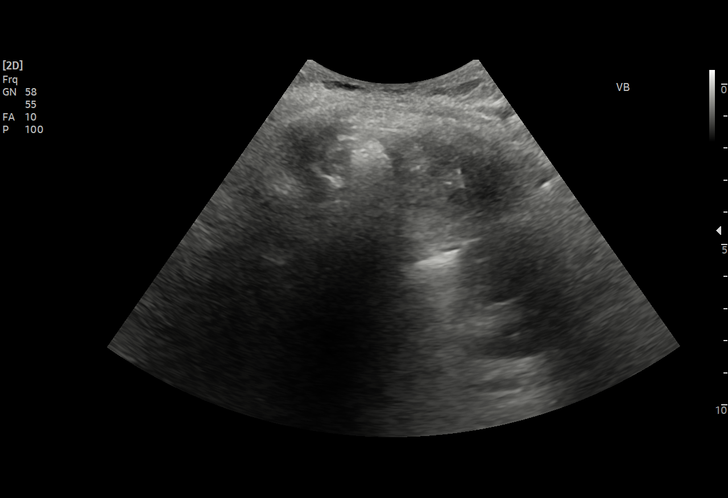
[im 9/23]
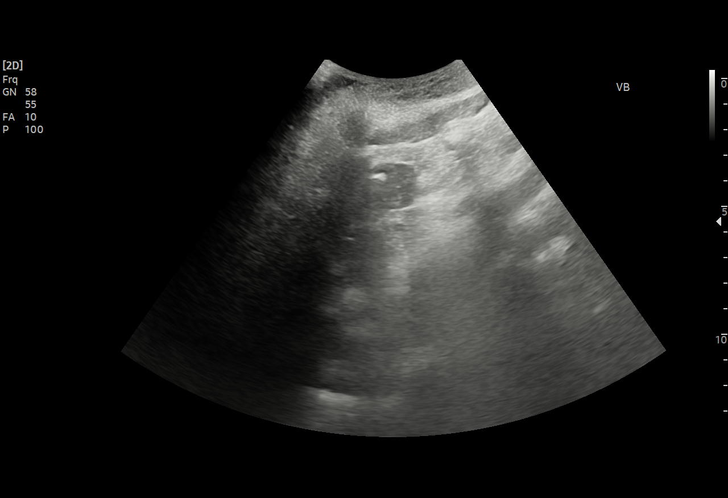
[im 10/23]
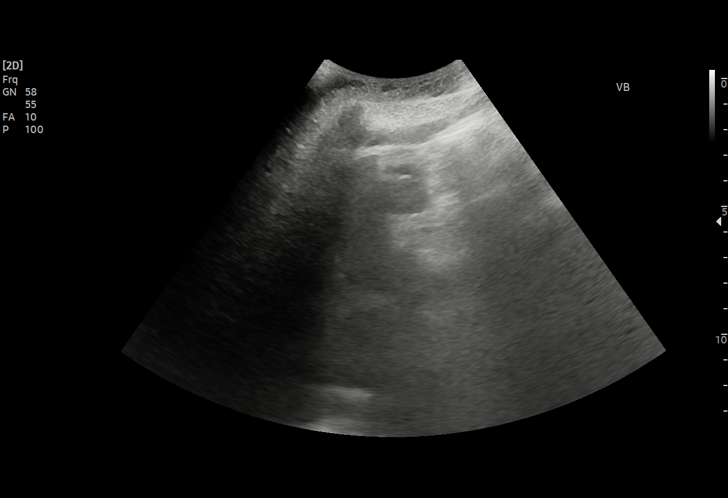
[im 12/23]
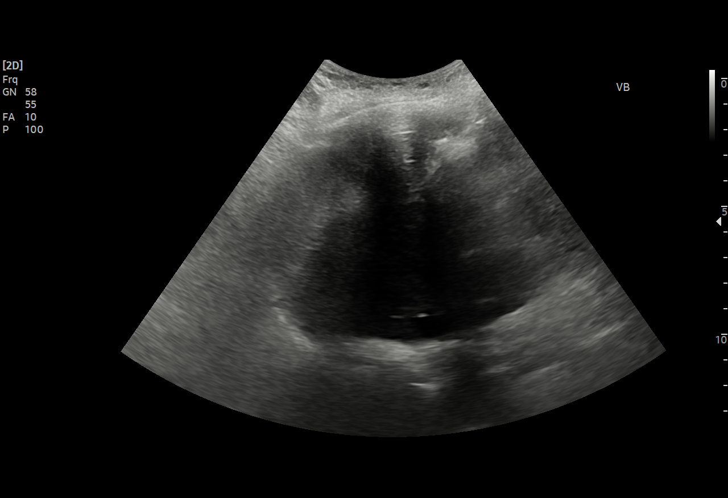
[im 14/23]
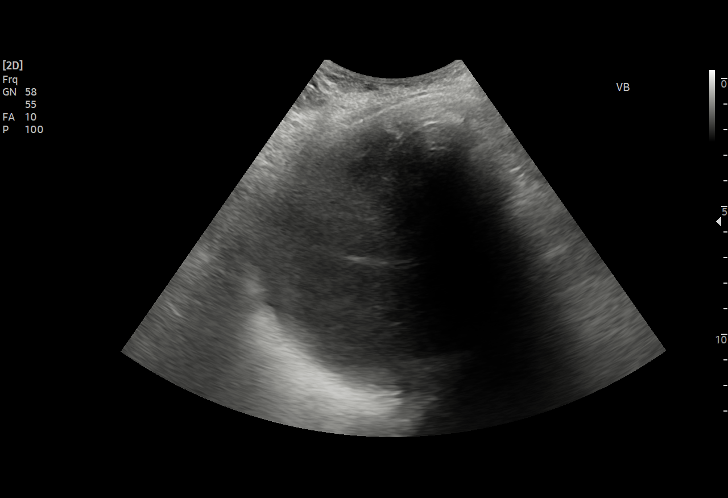
[im 15/23]
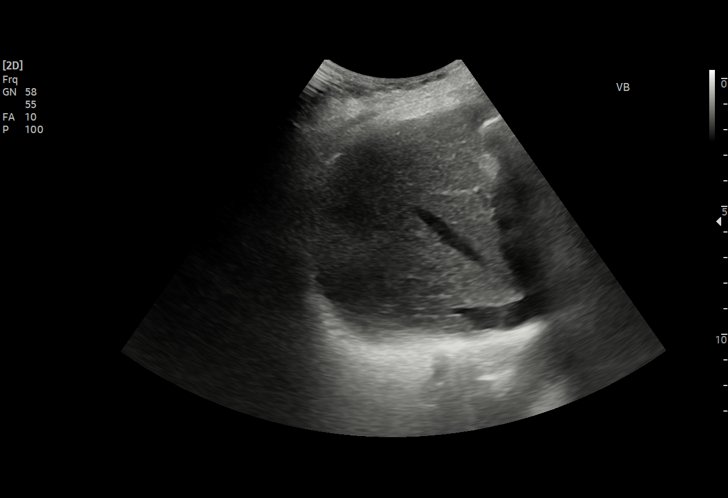
[im 17/23]
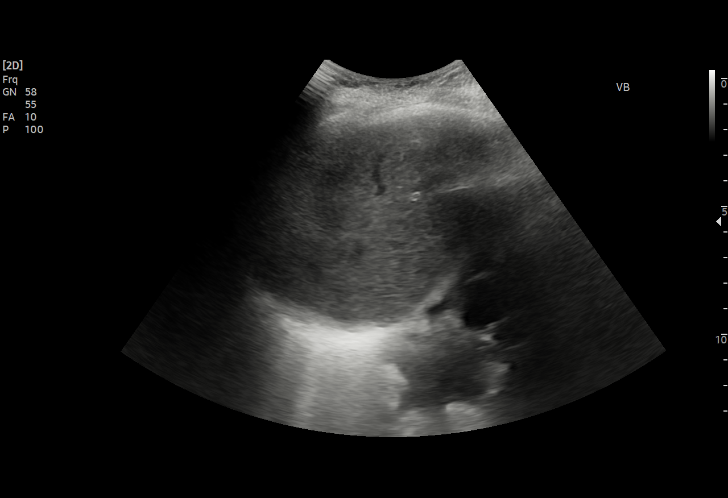
[im 18/23]
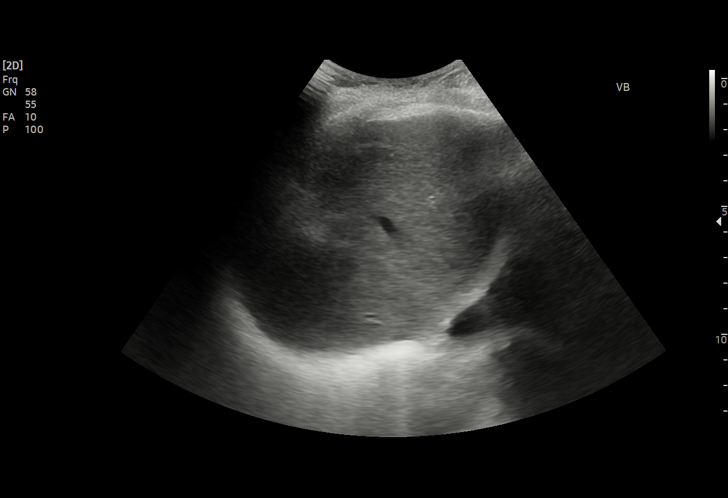
[im 20/23]
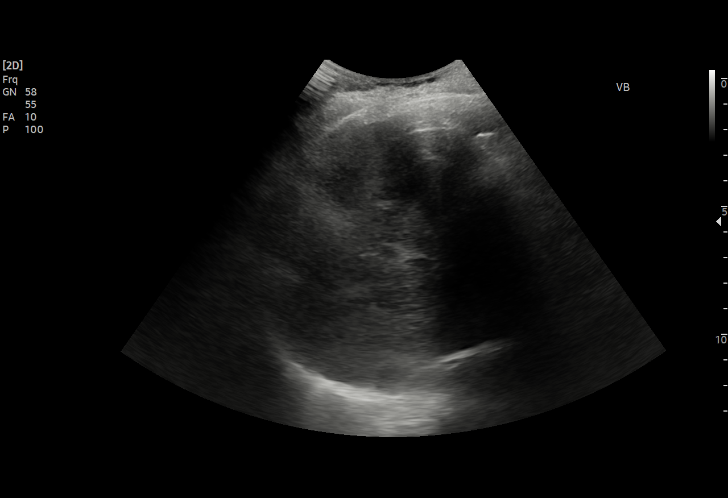
[im 21/23]
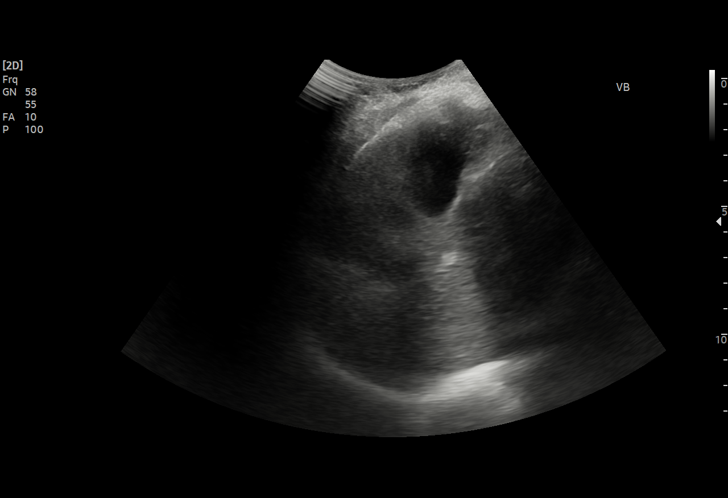
[im 23/23]
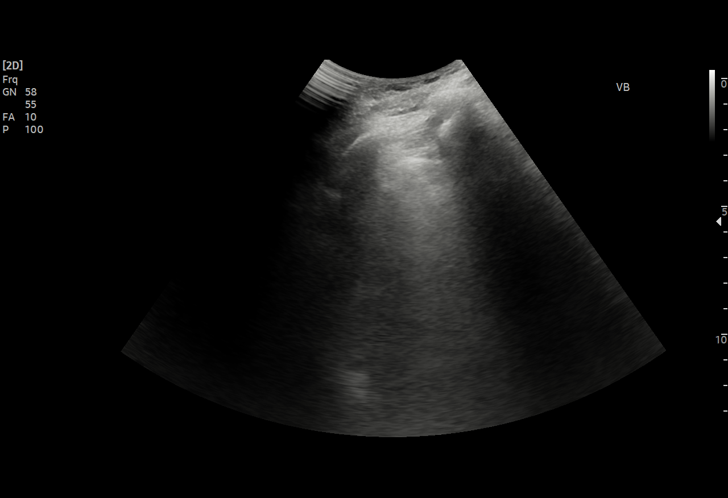

[15 of 23 positions shown; findings below may reference images not displayed]

FINDINGS: By sonographer report, examination is severely limited by patient
body habitus, sonographic window, and inability to position.

Gallbladder:

Not visualized.

Common bile duct:

Not visualized.

Liver:

Very limited views of the liver parenchyma. Within this limitation,
no focal lesion identified. Within normal limits in parenchymal
echogenicity. Portal vein cannot be assessed for color Doppler flow.

Other: None.
IMPRESSION: Severely limited examination as described. Nonvisualization of the
gallbladder, common bile duct, and main portal vein. No obvious
abnormality of the liver parenchyma with limited visualization.

## 2021-10-17 IMAGING — DX DG HIP (WITH OR WITHOUT PELVIS) 1V*L*
2 series · 2 of 2 positions shown · non-contrast
Comparison: 05/31/2021

CLINICAL DATA: ORIF left hip fracture

EXAM:
DG HIP (WITH OR WITHOUT PELVIS) 1V*L*

[pelvis ap]
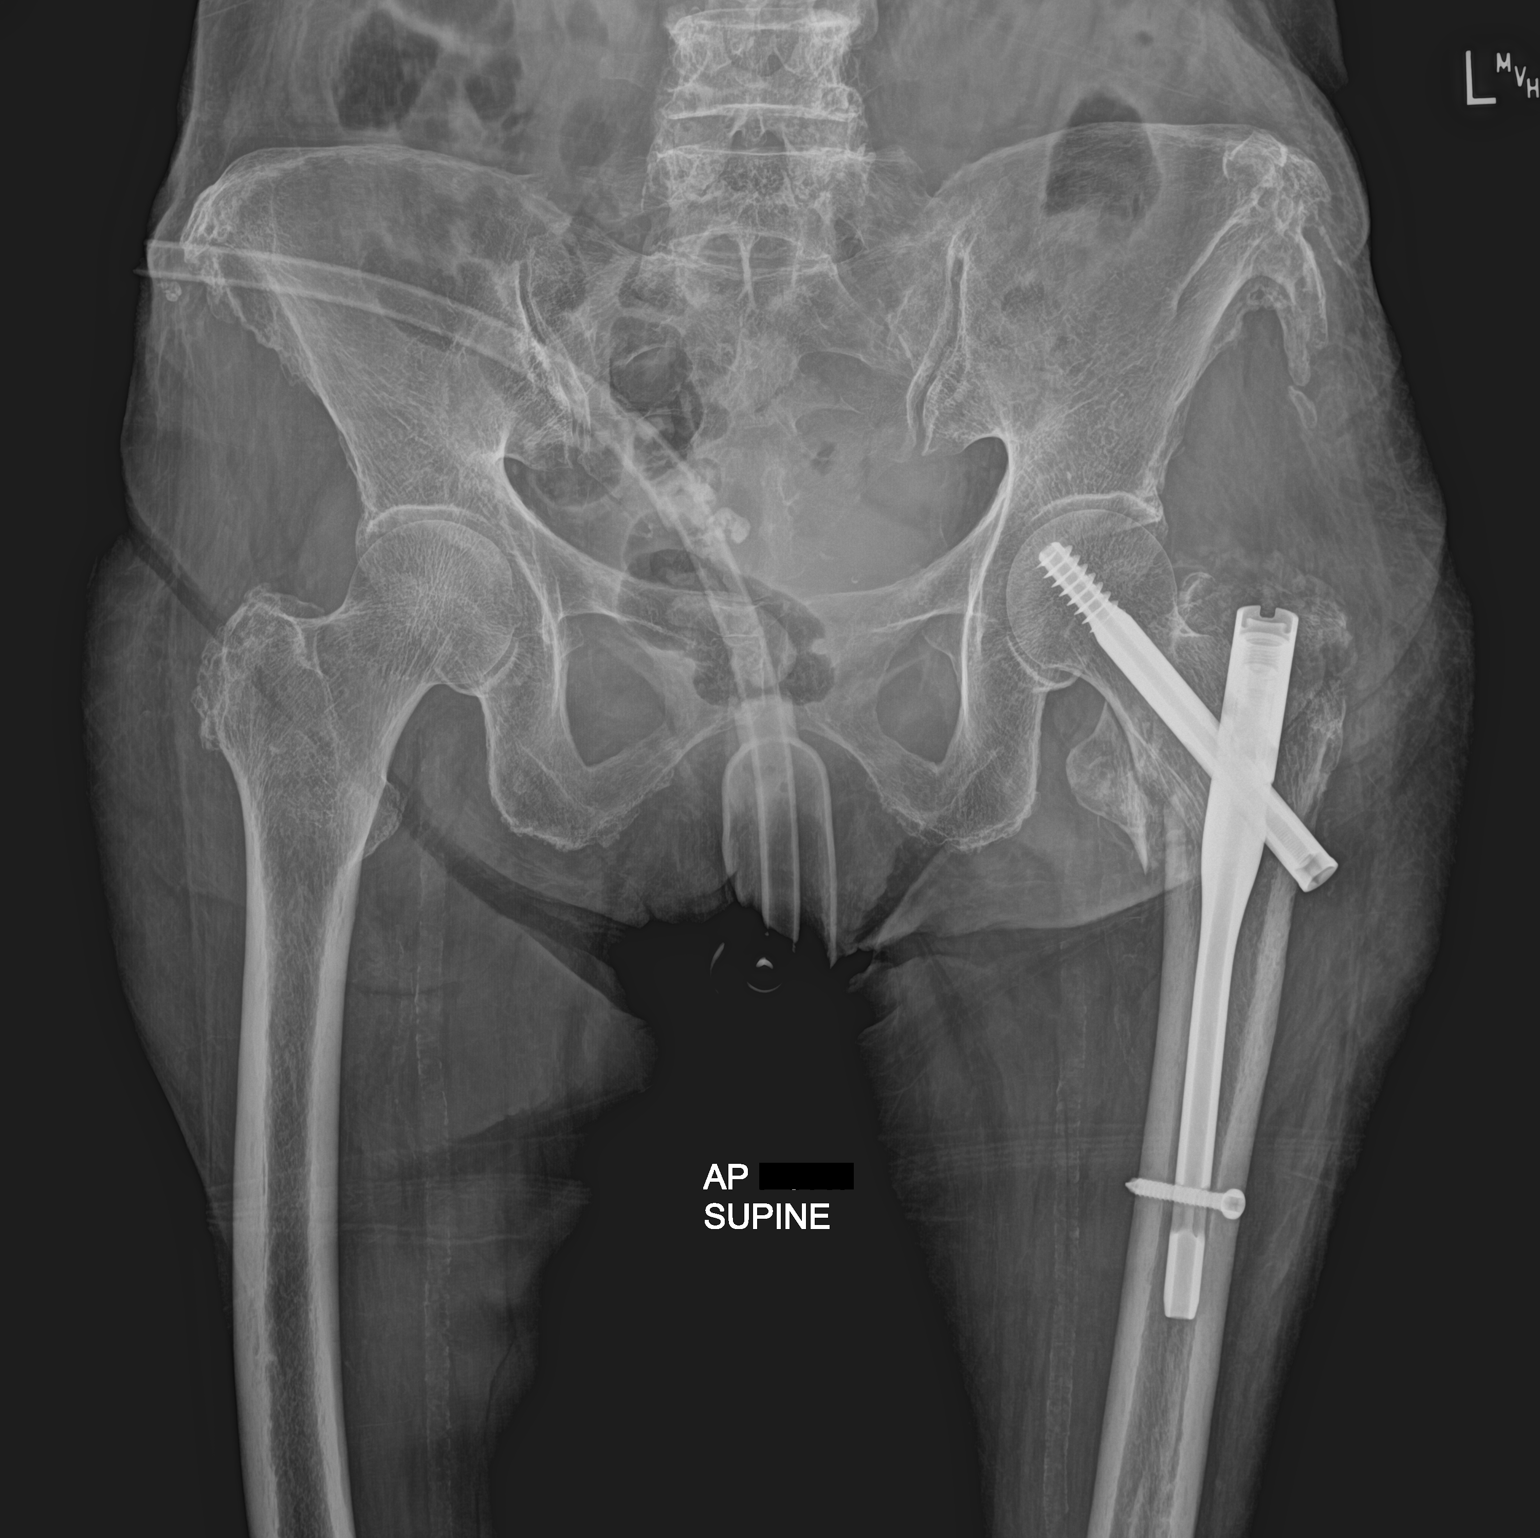

[hip ap]
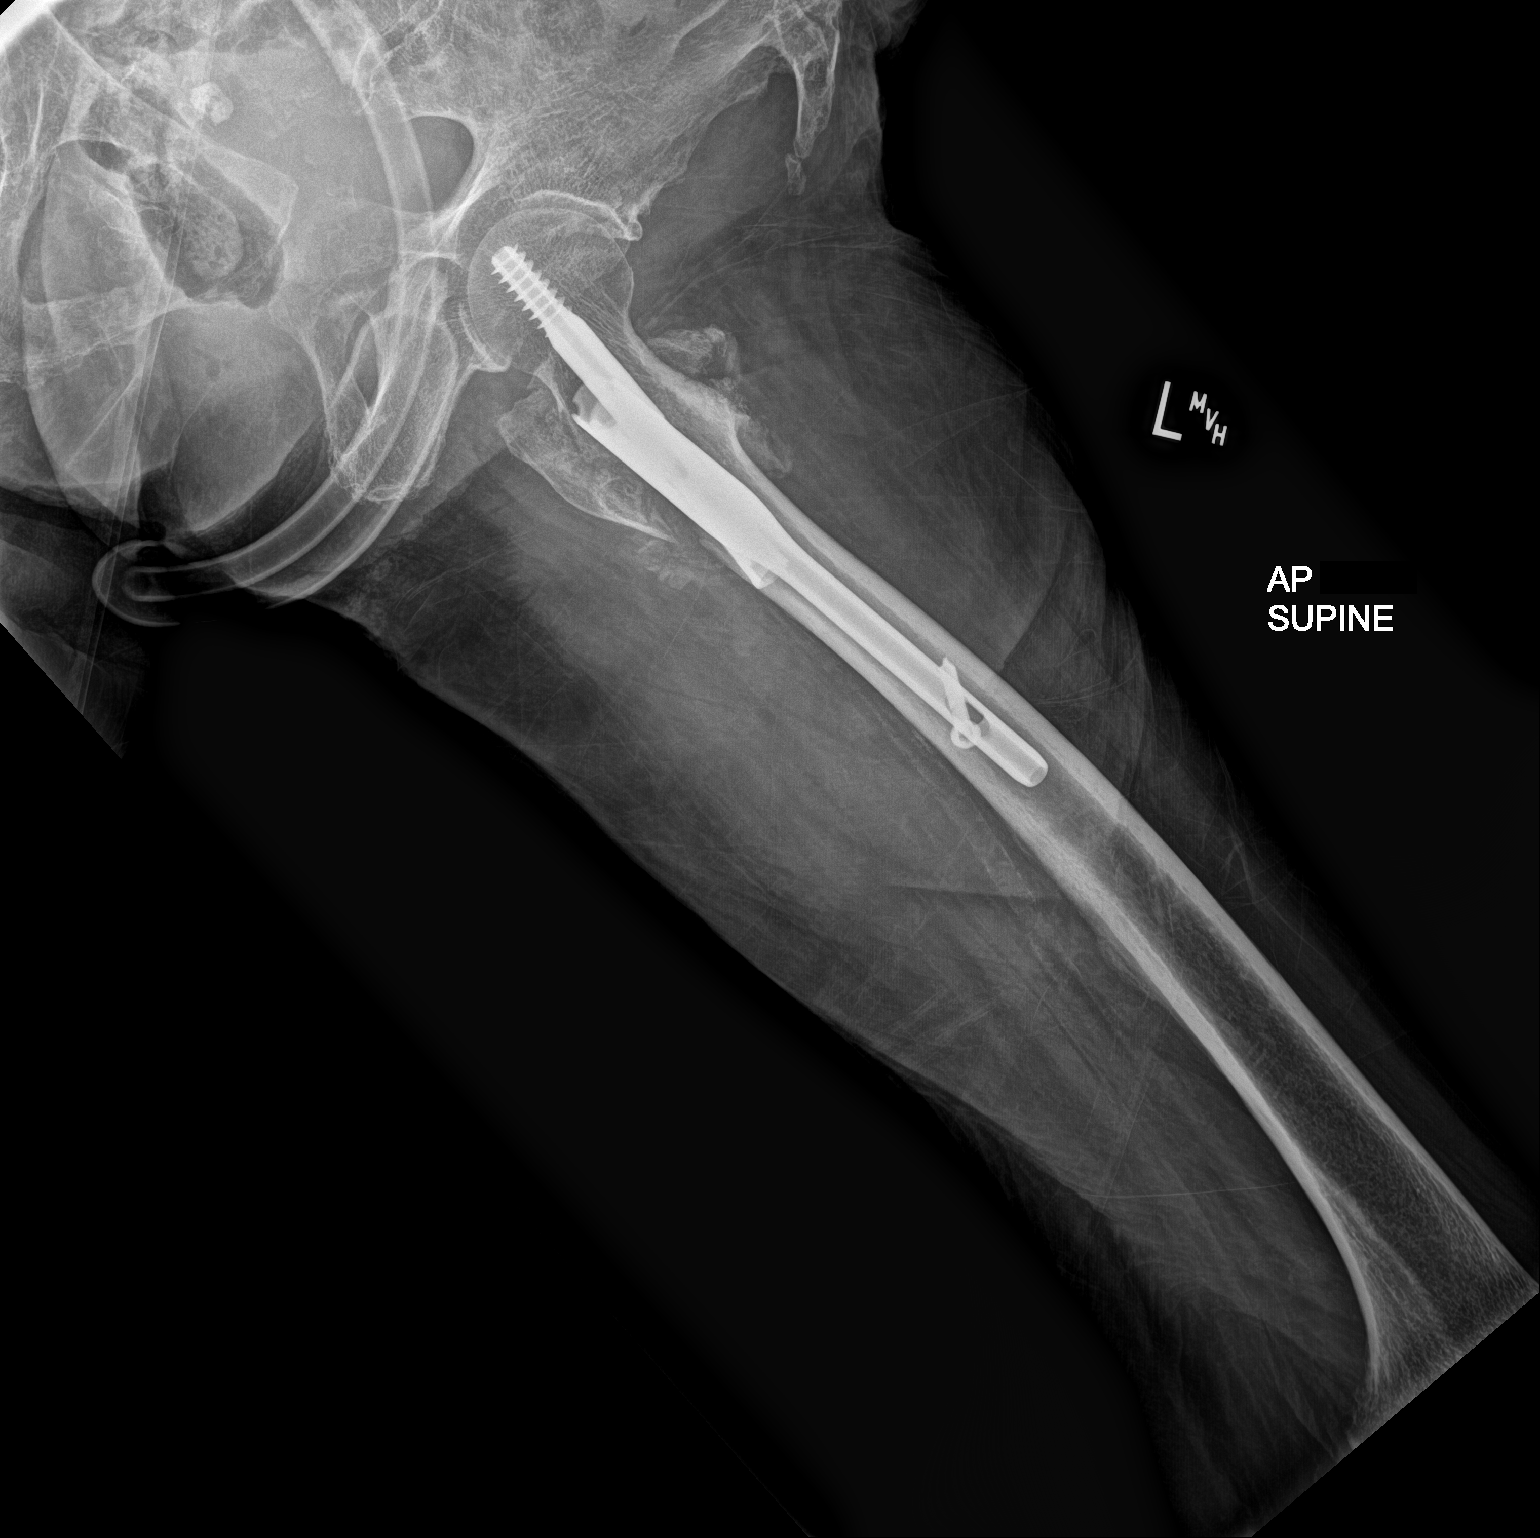

[2 of 2 positions shown; findings below may reference images not displayed]

FINDINGS: Frontal view of the pelvis as well as a frogleg lateral view of the
left hip are obtained. Intramedullary rod with proximal dynamic and
distal interlocking screw traverses the previously identified
intertrochanteric left hip fracture. Mild callus formation since
previous study. No new fractures. Alignment is anatomic.
IMPRESSION: 1. ORIF intertrochanteric left hip fracture, with mild callus
formation since prior study.
